# Patient Record
Sex: Female | Born: 1965 | Race: White | Hispanic: No | Marital: Married | Smoking: Never smoker
Health system: Southern US, Community
[De-identification: ages and names within clinical notes are randomized; demographics above are authoritative.]

## PROBLEM LIST (undated history)

## (undated) ENCOUNTER — Emergency Department (HOSPITAL_BASED_OUTPATIENT_CLINIC_OR_DEPARTMENT_OTHER)

## (undated) DIAGNOSIS — T8859XA Other complications of anesthesia, initial encounter: Secondary | ICD-10-CM

## (undated) DIAGNOSIS — R112 Nausea with vomiting, unspecified: Secondary | ICD-10-CM

## (undated) DIAGNOSIS — E079 Disorder of thyroid, unspecified: Secondary | ICD-10-CM

## (undated) DIAGNOSIS — M1990 Unspecified osteoarthritis, unspecified site: Secondary | ICD-10-CM

## (undated) DIAGNOSIS — I10 Essential (primary) hypertension: Secondary | ICD-10-CM

## (undated) DIAGNOSIS — E039 Hypothyroidism, unspecified: Secondary | ICD-10-CM

## (undated) DIAGNOSIS — L719 Rosacea, unspecified: Secondary | ICD-10-CM

## (undated) DIAGNOSIS — M549 Dorsalgia, unspecified: Secondary | ICD-10-CM

## (undated) DIAGNOSIS — K439 Ventral hernia without obstruction or gangrene: Secondary | ICD-10-CM

## (undated) DIAGNOSIS — K56609 Unspecified intestinal obstruction, unspecified as to partial versus complete obstruction: Secondary | ICD-10-CM

## (undated) DIAGNOSIS — T4145XA Adverse effect of unspecified anesthetic, initial encounter: Secondary | ICD-10-CM

## (undated) DIAGNOSIS — Z9889 Other specified postprocedural states: Secondary | ICD-10-CM

## (undated) HISTORY — PX: COLON SURGERY: SHX602

## (undated) HISTORY — PX: ENDOMETRIAL ABLATION: SHX621

## (undated) HISTORY — PX: LIVER BIOPSY: SHX301

## (undated) HISTORY — PX: BACK SURGERY: SHX140

## (undated) HISTORY — PX: REVISION COLOSTOMY: SHX1039

## (undated) HISTORY — PX: OTHER SURGICAL HISTORY: SHX169

## (undated) HISTORY — PX: CHOLECYSTECTOMY: SHX55

## (undated) HISTORY — PX: COLOSTOMY: SHX63

---

## 2001-02-07 ENCOUNTER — Other Ambulatory Visit: Admission: RE | Admit: 2001-02-07 | Discharge: 2001-02-07 | Payer: Self-pay | Admitting: Obstetrics and Gynecology

## 2003-07-28 ENCOUNTER — Ambulatory Visit (HOSPITAL_COMMUNITY): Admission: RE | Admit: 2003-07-28 | Discharge: 2003-07-28 | Payer: Self-pay | Admitting: Internal Medicine

## 2003-08-03 ENCOUNTER — Ambulatory Visit (HOSPITAL_COMMUNITY): Admission: RE | Admit: 2003-08-03 | Discharge: 2003-08-04 | Payer: Self-pay | Admitting: Neurological Surgery

## 2004-06-03 ENCOUNTER — Ambulatory Visit (HOSPITAL_COMMUNITY): Admission: RE | Admit: 2004-06-03 | Discharge: 2004-06-03 | Payer: Self-pay | Admitting: Neurological Surgery

## 2004-06-04 ENCOUNTER — Ambulatory Visit (HOSPITAL_COMMUNITY): Admission: AD | Admit: 2004-06-04 | Discharge: 2004-06-05 | Payer: Self-pay | Admitting: Neurological Surgery

## 2004-07-11 ENCOUNTER — Ambulatory Visit (HOSPITAL_COMMUNITY): Admission: RE | Admit: 2004-07-11 | Discharge: 2004-07-11 | Payer: Self-pay | Admitting: Neurological Surgery

## 2006-03-21 ENCOUNTER — Ambulatory Visit (HOSPITAL_COMMUNITY): Admission: RE | Admit: 2006-03-21 | Discharge: 2006-03-21 | Payer: Self-pay | Admitting: Neurological Surgery

## 2006-04-16 ENCOUNTER — Inpatient Hospital Stay (HOSPITAL_COMMUNITY): Admission: RE | Admit: 2006-04-16 | Discharge: 2006-04-23 | Payer: Self-pay | Admitting: Neurological Surgery

## 2006-06-18 ENCOUNTER — Ambulatory Visit (HOSPITAL_COMMUNITY): Admission: RE | Admit: 2006-06-18 | Discharge: 2006-06-18 | Payer: Self-pay | Admitting: Neurological Surgery

## 2006-11-26 ENCOUNTER — Ambulatory Visit (HOSPITAL_COMMUNITY): Admission: RE | Admit: 2006-11-26 | Discharge: 2006-11-26 | Payer: Self-pay | Admitting: Internal Medicine

## 2007-11-27 ENCOUNTER — Ambulatory Visit (HOSPITAL_COMMUNITY): Admission: RE | Admit: 2007-11-27 | Discharge: 2007-11-27 | Payer: Self-pay | Admitting: Internal Medicine

## 2008-05-11 ENCOUNTER — Ambulatory Visit (HOSPITAL_COMMUNITY): Admission: RE | Admit: 2008-05-11 | Discharge: 2008-05-11 | Payer: Self-pay | Admitting: Obstetrics & Gynecology

## 2008-06-17 ENCOUNTER — Encounter: Payer: Self-pay | Admitting: Obstetrics & Gynecology

## 2008-06-17 ENCOUNTER — Ambulatory Visit (HOSPITAL_COMMUNITY): Admission: RE | Admit: 2008-06-17 | Discharge: 2008-06-17 | Payer: Self-pay | Admitting: Obstetrics & Gynecology

## 2008-11-26 ENCOUNTER — Inpatient Hospital Stay (HOSPITAL_COMMUNITY): Admission: AD | Admit: 2008-11-26 | Discharge: 2008-12-02 | Payer: Self-pay | Admitting: Internal Medicine

## 2008-11-30 ENCOUNTER — Ambulatory Visit: Payer: Self-pay | Admitting: Internal Medicine

## 2008-12-01 ENCOUNTER — Encounter (INDEPENDENT_AMBULATORY_CARE_PROVIDER_SITE_OTHER): Payer: Self-pay | Admitting: General Surgery

## 2008-12-02 ENCOUNTER — Ambulatory Visit: Payer: Self-pay | Admitting: Internal Medicine

## 2008-12-03 ENCOUNTER — Encounter: Payer: Self-pay | Admitting: Internal Medicine

## 2008-12-06 ENCOUNTER — Ambulatory Visit: Payer: Self-pay | Admitting: Internal Medicine

## 2009-01-11 ENCOUNTER — Encounter: Payer: Self-pay | Admitting: Internal Medicine

## 2009-01-25 ENCOUNTER — Encounter: Payer: Self-pay | Admitting: Internal Medicine

## 2009-02-11 ENCOUNTER — Encounter (INDEPENDENT_AMBULATORY_CARE_PROVIDER_SITE_OTHER): Payer: Self-pay | Admitting: *Deleted

## 2009-02-15 ENCOUNTER — Encounter: Payer: Self-pay | Admitting: Gastroenterology

## 2009-02-15 DIAGNOSIS — E119 Type 2 diabetes mellitus without complications: Secondary | ICD-10-CM

## 2009-02-15 LAB — CONVERTED CEMR LAB: Creatinine, Ser: 0.43 mg/dL (ref 0.40–1.20)

## 2009-02-16 ENCOUNTER — Encounter: Payer: Self-pay | Admitting: Internal Medicine

## 2009-02-17 ENCOUNTER — Ambulatory Visit (HOSPITAL_COMMUNITY): Admission: RE | Admit: 2009-02-17 | Discharge: 2009-02-17 | Payer: Self-pay | Admitting: Internal Medicine

## 2009-02-17 ENCOUNTER — Encounter: Payer: Self-pay | Admitting: Internal Medicine

## 2009-03-01 ENCOUNTER — Ambulatory Visit (HOSPITAL_COMMUNITY): Admission: RE | Admit: 2009-03-01 | Discharge: 2009-03-01 | Payer: Self-pay | Admitting: Internal Medicine

## 2009-07-13 ENCOUNTER — Other Ambulatory Visit: Admission: RE | Admit: 2009-07-13 | Discharge: 2009-07-13 | Payer: Self-pay | Admitting: Obstetrics & Gynecology

## 2009-08-23 ENCOUNTER — Ambulatory Visit (HOSPITAL_COMMUNITY): Admission: RE | Admit: 2009-08-23 | Discharge: 2009-08-23 | Payer: Self-pay | Admitting: Internal Medicine

## 2010-07-19 ENCOUNTER — Other Ambulatory Visit: Admission: RE | Admit: 2010-07-19 | Discharge: 2010-07-19 | Payer: Self-pay | Admitting: Obstetrics & Gynecology

## 2010-08-25 ENCOUNTER — Ambulatory Visit (HOSPITAL_COMMUNITY)
Admission: RE | Admit: 2010-08-25 | Discharge: 2010-08-25 | Payer: Self-pay | Source: Home / Self Care | Attending: Obstetrics & Gynecology | Admitting: Obstetrics & Gynecology

## 2010-09-11 ENCOUNTER — Encounter: Payer: Self-pay | Admitting: Internal Medicine

## 2010-11-15 ENCOUNTER — Other Ambulatory Visit (HOSPITAL_COMMUNITY): Payer: Self-pay | Admitting: Neurological Surgery

## 2010-11-15 DIAGNOSIS — M545 Low back pain: Secondary | ICD-10-CM

## 2010-11-17 ENCOUNTER — Ambulatory Visit (HOSPITAL_COMMUNITY)
Admission: RE | Admit: 2010-11-17 | Discharge: 2010-11-17 | Disposition: A | Discharge status: Home / Self Care | Payer: Medicare Other | Source: Ambulatory Visit | Attending: Neurological Surgery | Admitting: Neurological Surgery

## 2010-11-17 DIAGNOSIS — M545 Low back pain, unspecified: Secondary | ICD-10-CM | POA: Insufficient documentation

## 2010-11-17 DIAGNOSIS — M5126 Other intervertebral disc displacement, lumbar region: Secondary | ICD-10-CM | POA: Insufficient documentation

## 2010-11-17 DIAGNOSIS — M5124 Other intervertebral disc displacement, thoracic region: Secondary | ICD-10-CM | POA: Insufficient documentation

## 2010-11-30 LAB — GLUCOSE, CAPILLARY
Glucose-Capillary: 104 mg/dL — ABNORMAL HIGH (ref 70–99)
Glucose-Capillary: 115 mg/dL — ABNORMAL HIGH (ref 70–99)
Glucose-Capillary: 120 mg/dL — ABNORMAL HIGH (ref 70–99)
Glucose-Capillary: 124 mg/dL — ABNORMAL HIGH (ref 70–99)
Glucose-Capillary: 138 mg/dL — ABNORMAL HIGH (ref 70–99)
Glucose-Capillary: 158 mg/dL — ABNORMAL HIGH (ref 70–99)
Glucose-Capillary: 163 mg/dL — ABNORMAL HIGH (ref 70–99)
Glucose-Capillary: 181 mg/dL — ABNORMAL HIGH (ref 70–99)
Glucose-Capillary: 70 mg/dL (ref 70–99)
Glucose-Capillary: 73 mg/dL (ref 70–99)
Glucose-Capillary: 77 mg/dL (ref 70–99)
Glucose-Capillary: 81 mg/dL (ref 70–99)
Glucose-Capillary: 84 mg/dL (ref 70–99)
Glucose-Capillary: 93 mg/dL (ref 70–99)

## 2010-11-30 LAB — BASIC METABOLIC PANEL
BUN: 2 mg/dL — ABNORMAL LOW (ref 6–23)
BUN: 4 mg/dL — ABNORMAL LOW (ref 6–23)
CO2: 28 mEq/L (ref 19–32)
Creatinine, Ser: 0.37 mg/dL — ABNORMAL LOW (ref 0.4–1.2)
GFR calc non Af Amer: >60 mL/min (ref >60)
GFR calc non Af Amer: >60 mL/min (ref >60)
Glucose, Bld: 147 mg/dL — ABNORMAL HIGH (ref 70–99)
Glucose, Bld: 71 mg/dL (ref 70–99)
Potassium: 3.8 mEq/L (ref 3.5–5.1)

## 2010-11-30 LAB — HCG, QUANTITATIVE, PREGNANCY: hCG, Beta Chain, Quant, S: <2 m[IU]/mL (ref <5)

## 2010-11-30 LAB — DIFFERENTIAL
Basophils Absolute: 0 10*3/uL (ref 0.0–0.1)
Basophils Absolute: 0 10*3/uL (ref 0.0–0.1)
Basophils Relative: 0 % (ref 0–1)
Basophils Relative: 0 % (ref 0–1)
Eosinophils Absolute: 0.1 10*3/uL (ref 0.0–0.7)
Eosinophils Absolute: 0.2 10*3/uL (ref 0.0–0.7)
Eosinophils Relative: 3 % (ref 0–5)
Lymphocytes Relative: 27 % (ref 12–46)
Lymphs Abs: 1.6 10*3/uL (ref 0.7–4.0)
Monocytes Absolute: 0.6 10*3/uL (ref 0.1–1.0)
Neutrophils Relative %: 76 % (ref 43–77)

## 2010-11-30 LAB — CBC
HCT: 36.3 % (ref 36.0–46.0)
Hemoglobin: 12.5 g/dL (ref 12.0–15.0)
MCHC: 34.4 g/dL (ref 30.0–36.0)
MCHC: 34.5 g/dL (ref 30.0–36.0)
MCV: 87.5 fL (ref 78.0–100.0)
Platelets: 199 10*3/uL (ref 150–400)
Platelets: 256 10*3/uL (ref 150–400)
RDW: 14.1 % (ref 11.5–15.5)
RDW: 14.3 % (ref 11.5–15.5)
WBC: 9.5 10*3/uL (ref 4.0–10.5)

## 2010-11-30 LAB — HEPATIC FUNCTION PANEL
Albumin: 3.5 g/dL (ref 3.5–5.2)
Indirect Bilirubin: 0.4 mg/dL (ref 0.3–0.9)
Total Protein: 6.8 g/dL (ref 6.0–8.3)

## 2010-11-30 LAB — H. PYLORI ANTIBODY, IGG: H Pylori IgG: <0.4 {ISR}

## 2011-01-03 NOTE — Consult Note (Signed)
Nancy [name], Nancy [name]                ACCOUNT NO.:  [id number]   MEDICAL RECORD NO.:  [id number]          PATIENT TYPE:  INP   LOCATION:  A313                          FACILITY:  [facility]   PHYSICIAN:  R. Roetta Sessions, M.D. DATE OF BIRTH:  [birth year]   DATE OF CONSULTATION:  11/29/2008  DATE OF DISCHARGE:                                 CONSULTATION   REASON FOR CONSULTATION:  Postprandial upper abdominal pain with  negative ultrasound and HIDA.   HISTORY OF PRESENT ILLNESS:  Nancy [name] is a very pleasant obese  45 year old lady from [place], [place], admitted to the hospital  November 26, 2008, with a 1 month history of insistent progressive  postprandial band like upper abdominal pain which she feels starts in  the area of her retro xiphoid region.  It almost always comes on after  eating.  She had a couple of severe episodes recently after eating a  fairly heavy fatty meal.  Within 30 minutes to an hour of taking the  meal she starts having these symptoms and they build for a couple of  hours and then they settle down over several more hours.  She has had  some associated nausea.  Denies any typical reflux symptoms.  No  odynophagia, no dysphagia.  Has not had any melena or hematochezia,  constipation or diarrhea.  She denies any recent weight loss, fever or  chills.  She has been admitted to Dr. Alonza Smoker service.  She has been  seen with Dr. Lovell Sheehan.  An ultrasound of the right upper quadrant  demonstrated a normal appearing gallbladder and biliary tree.  HIDA scan  demonstrated gallbladder EF of 63% without any reproduction in symptoms.  CT of the abdomen demonstrated a large left ovarian cyst and a small  nonspecific lesion in the right lobe of the liver for which an MRI was  recommended.  Her LFTs have been completely normal.  Amylase, lipase  also normal as well as CBC.  She denies any similar illness prior to  this past month.  No family history of GI malignancy or  chronic  gastrointestinal illness.   PAST MEDICAL HISTORY:  Significant for colonic perforation related to a  colonoscopy with polypectomy (Dr. Samuella Cota in [place]) back in 1995 for  which she underwent a laparotomy with diverting colonoscopy and  subsequent takedown over in [place], [place], and subsequent to  that Dr. Lovell Sheehan here repaired a peristomal hernia which had developed.  Past medical history of diabetes, hypertension, microalbuminuria,  hypothyroidism, obesity and endometrial ablation, nonalcoholic fatty  liver disease, polycystic ovarian syndrome, lumbar disk disease,  peripheral neuropathy, rosacea, history of kidney stones.  She has had  multiple spine surgeries relating to diskogenic disease.   ADMISSION MEDICATIONS:  1. Lantus 140 mg b.i.d.  2. NovoLog 15 units before each meal.  3. Metformin 1 g b.i.d.  4. Levoxyl 175 mcg daily.  5. Vasotec 5 mg daily.  6. Metrogel daily.  7. Amitriptyline 25 mg daily.  8. Robaxin p.r.n.  9. HCTZ 25 mg p.o.   ALLERGIES:  No known drug allergies.  FAMILY HISTORY:  Significant for hypertension, osteoporosis,  fibromyalgia, diabetes.  Again, no history of chronic GI or liver  illness.   SOCIAL HISTORY:  The patient lives in [place] with her husband.  No  tobacco, alcohol or illicit drugs.   REVIEW OF SYSTEMS:  Pretty much as in history of present illness.  Has  not had any yellow jaundice, dark tarry stools or dark colored urine.   PHYSICAL EXAMINATION:  GENERAL:  Reveals a pleasant 45 year old lady  resting comfortably in her hospital bed attended by her husband.  VITAL SIGNS:  Temperature 97.2, pulse 55, respiratory rate 20, BP  126/73.  SKIN:  Warm and dry.  HEENT:  No scleral icterus.  Conjunctivae are pink.  CHEST/LUNGS:  Clear to auscultation.  CARDIOVASCULAR:  Regular rate and rhythm without murmur, gallop or rub.  BREASTS:  Exam was deferred.  ABDOMEN:  Nondistended.  Obese.  She has a midline laparotomy scar  well-  healed and a very subtle colostomy scar left abdomen.  Positive bowel  sounds.  No bruits.  She does have retro xiphoid upper epigastric  discomfort to palpation.  Some discomfort also to the right upper  quadrant.  No appreciable mass or organomegaly.  EXTREMITIES:  No clubbing, cyanosis or edema.   ADMISSION LABORATORY DATA:  Beta hCG negative.  Hepatic function studies  completely normal on April 8.  CBC on April 8 showing white count 11.5,  H and H 13.5 and 39.2, MCV 87.5, platelet count 256,000, amylase 65.   IMPRESSION:  Nancy [name] is a pleasant 44 year old lady admitted  to the hospital with basically a 1 month history of postprandial upper  abdominal pain which sounds highly suspicious for gallbladder etiology  to me.  However, sonographically her gallbladder appears normal.  HIDA  study demonstrates no evidence of acute cholecystitis or biliary  dyskinesia.  CT also unrevealing as to the etiology of her symptoms.  She does have a nonspecific right hepatic lobe liver lesion which needs  followup.  However, as suggested by radiology she will not be able to  have an MRI because of the hardware in her back related to prior spine  surgery.   She has a history of a colonic perforation related to polypectomy  previously.  I do not have any records as to what type of polyp was  taken off up in [place] back in 1995.  This information should be  pursued as she may be overdue for surveillance colonoscopy.   Again, as far as her recent illness is concerned there is really nothing  in her history to suggest uncontrolled gastroesophageal reflux disease  or other acid peptic disease.  She really does not have any alarm  symptoms suggestive of more ominous underlying occult pathology such as  neoplasia.  I feel the lesion in the right lobe of her liver has nothing  to do with her present symptoms.   RECOMMENDATIONS:  1. I agree with Dr. Ouida Sills.  She ought to go ahead and  have an EGD to      rule out luminal pathology in her upper GI tract contributing to      her symptoms.  If this study is negative I feel Nancy [name] ought to      go ahead and have a laparoscopic cholecystectomy regardless of      negative findings on HIDA and ultrasound.  2. I do recommend she have a 3 month followup hepatic CT scan with IV  contrast.  3. We will look at old records going back to 1995 to see if we can      shed any light on pathology on prior polyp removed so that we can      make recommendations regarding her next colonoscopy.   I would like to thank Dr. Carylon Perches for allowing me to see this nice  lady once again in consultation.      Jonathon Bellows, M.D.  Electronically Signed     RMR/MEDQ  D:  11/29/2008  T:  11/29/2008  Job:  [id number]   cc:   Dalia Heading, M.D.  Fax: [phone]   Kingsley Callander. Ouida Sills, MD  Fax: [phone]

## 2011-01-03 NOTE — Op Note (Signed)
Nancy [name], Nancy [name]                ACCOUNT NO.:  [id number]   MEDICAL RECORD NO.:  [id number]          PATIENT TYPE:  AMB   LOCATION:  DAY                           FACILITY:  [facility]   PHYSICIAN:  Lazaro Arms, M.D.   DATE OF BIRTH:  August 30, 1965   DATE OF PROCEDURE:  06/17/2008  DATE OF DISCHARGE:                               OPERATIVE REPORT   PREOPERATIVE DIAGNOSES:  1. Menometrorrhagia.  2. Endometrial polyp versus submucosal myoma.   POSTOPERATIVE DIAGNOSES:  1. Menometrorrhagia.  2. Endometrial polyp versus submucosal myoma.  3. Submucosal myoma.   PROCEDURE:  1. Hysteroscopic excision of submucosal myoma.  2. Endometrial ablation.  3. Endometrial laser as well.   SURGEON:  Lazaro Arms, MD   ANESTHESIA:  General endotracheal.   FINDINGS:  The patient had a stubborn 2- x 2-cm submucosal myoma.  The  rest of the endometrial cavity looked normal.   DESCRIPTION OF OPERATION:  The patient was taken to the operating room  and placed in supine position, then underwent general endotracheal  anesthesia, placed in dorsal lithotomy position, and prepped and draped  in the usual sterile fashion.  A speculum was placed.  Cervix dilated  serially to allow passage of the hysteroscope.  Hysteroscopy performed.  I spent about 30 minutes trying to excise a very stubborn 2- x 2-cm  submucosal myoma.  I did it bluntly.  I tried curetting.  I tried a  laser.  Laser worked pretty well, but just created too many bubbles like  the ring forceps, which I had been trying before, but was unsuccessful  and twisted off.  There was no bleeding on the stalk.  I then did an  endometrial ablation using the ThermaChoice 3 endometrial ablation  balloon.  A 45 mL of D5W was required to maintain a pressure of 192  mmHg.  Total therapy time was 15 minutes and 4 seconds, I believe.  First catheter did  have an error.  We had to change catheters, but I had not started the  procedure yet.  The patient  tolerated the procedure well.  She  experienced minimal blood loss and was taken to the recovery room in  good and stable condition.  All counts were correct.      Lazaro Arms, M.D.  Electronically Signed     LHE/MEDQ  D:  06/17/2008  T:  06/17/2008  Job:  [id number]

## 2011-01-03 NOTE — Op Note (Signed)
Nancy [name], Nancy [name]                ACCOUNT NO.:  [id number]   MEDICAL RECORD NO.:  [id number]          PATIENT TYPE:  INP   LOCATION:  A313                          FACILITY:  [facility]   PHYSICIAN:  Dalia Heading, M.D.  DATE OF BIRTH:  1966/05/26   DATE OF PROCEDURE:  12/01/2008  DATE OF DISCHARGE:                               OPERATIVE REPORT   PREOPERATIVE DIAGNOSIS:  Chronic cholecystitis.   POSTOPERATIVE DIAGNOSIS:  Chronic cholecystitis.   PROCEDURE:  Laparoscopic cholecystectomy.   SURGEON:  Dalia Heading, MD   ANESTHESIA:  General endotracheal.   INDICATIONS:  The patient is a 45 year old white female who presents  with a biliary colic.  She has had an extensive workup including  ultrasound of the gallbladder, HIDA scan, an EGD, all of which had been  negative.  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  The risks and  benefits of the procedure including bleeding, infection, recurrence of  her symptoms, hepatobiliary injury, and the possibility of an open  procedure were fully explained to the patient, gave informed consent.   PROCEDURE NOTE:  The patient was placed in supine position.  After  induction of general endotracheal anesthesia, the abdomen was prepped  and draped using the usual sterile technique with Betadine.  Surgical  site confirmation was performed.   A supraumbilical incision was made down to the fascia.  A Veress needle  was introduced into the abdominal cavity and confirmation of placement  was done using the saline drop test.  The abdomen was then insufflated  to 16 mmHg pressure.  An 11-mm trocar was introduced into the abdominal  cavity under direct visualization without difficulty.  The patient was  placed in reverse Trendelenburg position.  An additional 11-mm trocar  was placed in the epigastric region.  5-mm trocars were placed in the  right upper quadrant, right flank  regions.  Liver was inspected and  noted to be fatty in nature.  The gallbladder was retracted superior and  laterally.  The dissection was begun around the infundibulum of the  gallbladder.  The cystic duct was first identified.  The juncture to the  infundibulum was fully identified.  Endoclips were placed proximally and  distally on the cystic duct and the cystic duct was divided.  This was  likewise done on the cystic artery.  The gallbladder was then freed away  from the gallbladder fossa using Bovie electrocautery.  Gallbladder was  delivered through the epigastric trocar site using EndoCatch bag.  The  gallbladder fossa was inspected.  No abnormal bleeding or bile leakage  was noted.  Surgicel was placed in the gallbladder fossa.  All fluid and  air were then evacuate from the abdominal cavity prior to removal of the  trocars.   All wounds were irrigated with normal saline.  All wounds were injected  with 0.5% Sensorcaine.  The supraumbilical fascia was reapproximated  using an 0 Vicryl interrupted suture.  All skin incisions were closed  using staples.  Betadine ointment and dry sterile dressings  were  applied.   All tape and needle counts were correct at the end of the procedure.  The patient was extubated in the operating room, went back to recovery  room awake in stable condition.   COMPLICATIONS:  None.   SPECIMEN:  Gallbladder.   BLOOD LOSS:  Minimal.      Dalia Heading, M.D.  Electronically Signed     MAJ/MEDQ  D:  12/01/2008  T:  12/02/2008  Job:  [id number]   cc:   Kingsley Callander. Ouida Sills, MD  Fax: [phone]   R. Roetta Sessions, M.D.  [address]  [place]  [place] [address]

## 2011-01-03 NOTE — Discharge Summary (Signed)
NAMEMABREY, HOWLAND                ACCOUNT NO.:  [id number]   MEDICAL RECORD NO.:  [id number]          PATIENT TYPE:  INP   LOCATION:  A313                          FACILITY:  [facility]   PHYSICIAN:  Dalia Heading, M.D.  DATE OF BIRTH:  09/11/1965   DATE OF ADMISSION:  11/26/2008  DATE OF DISCHARGE:  04/14/2010LH                               DISCHARGE SUMMARY   HOSPITAL COURSE SUMMARY:  The patient is a 45 year old white female with  multiple medical problems who presented to the hospital with worsening  upper abdominal pain, nausea, vomiting.  She was admitted by Dr. Carylon Perches for further evaluation and treatment.  A surgery consultation was  obtained.  She was noted to have normal liver enzyme tests.  She had an  ultrasound of the gallbladder which was negative.  A HIDA scan was also  performed which was negative.  Dr. Jena Gauss of gastroenterology was  consulted.  An EGD for the most part was unremarkable.  The patient  continued to have biliary colic symptoms.  It was felt after discussion  that she needed her gallbladder out due to sphincter of Oddi  dysfunction.  She subsequently underwent a laparoscopic cholecystectomy  on December 01, 2008.  She tolerated the procedure well.  Her postoperative  course has been unremarkable.  Diet was advanced without difficulty.  Her preoperative symptoms have resolved.   The patient is being discharged home on December 02, 2008, in good  improving condition.   DISCHARGE INSTRUCTIONS:  The patient is to follow up Dr. Franky Macho on  December 10, 2008.   Discharge medications include:  1. Vicodin 1-2 tablets p.o. q.4 hours p.r.n. pain.  2. Baby aspirin 1 tablet p.o. daily.  3. Levothyroxine 175 mcg p.o. daily.  4. Metformin 1000 mg p.o. b.i.d.  5. Enalapril 5 mg p.o. nightly.  6. Lantus 140 units subcu b.i.d.  7. NovoLog regular insulin 15 units subcu before each meal.  8. Methocarbamol 500 mg p.o. daily.  9. Doxycycline 100 mg p.o. b.i.d.  10.Metronidazole cream twice a day to the face.   PRINCIPAL DIAGNOSES:  1. Biliary dyskinesia.  2. Hypothyroidism.  3. Insulin-dependent diabetes mellitus.  4. Obesity.   PRINCIPAL PROCEDURES:  1. EGD on November 30, 2008.  2. Laparoscopic cholecystectomy on December 01, 2008.      Dalia Heading, M.D.  Electronically Signed     MAJ/MEDQ  D:  12/02/2008  T:  12/03/2008  Job:  [id number]   cc:   R. Roetta Sessions, M.D.  [address]  [place]  [place] [address]   Kingsley Callander. Ouida Sills, MD  Fax: [phone]

## 2011-01-03 NOTE — Op Note (Signed)
NAMESVETLANA, BAGBY                ACCOUNT NO.:  [id number]   MEDICAL RECORD NO.:  [id number]          PATIENT TYPE:  INP   LOCATION:  A313                          FACILITY:  [facility]   PHYSICIAN:  R. Roetta Sessions, M.D. DATE OF BIRTH:  07/28/1966   DATE OF PROCEDURE:  11/30/2008  DATE OF DISCHARGE:  12/02/2008                               OPERATIVE REPORT   PROCEDURE:  Diagnostic esophagogastroduodenoscopy.   INDICATIONS FOR PROCEDURE:  A 45 year old lady with a history of  postprandial upper abdominal pain.  Ultrasound of the gallbladder  negative and HIDA demonstrates no evidence of cholecystitis or biliary  dyskinesia.  EGD now being done.  The risks, benefits, alternatives and  limitations have been discussed, questions answered.  Please see the  documentation in the medical record.   PROCEDURE NOTE:  O2 saturation, blood pressure, pulse and respiration  were monitored throughout the entirety of the procedure.  Conscious  sedation with Versed 4 mg IV, Demerol 100 mg IV in divided doses.   INSTRUMENT:  Pentax video chip system.   FINDINGS:  Examination of the tubular esophagus revealed normal mucosa.  EG junction easily traversed.  Stomach:  The gastric cavity was emptied  and insufflated well with air.  Thorough examination of the gastric  mucosa, including retroflexion of the proximal stomach and  esophagogastric junction demonstrated only a small hiatal hernia and  tiny antral erosions.  The pylorus was patent and easily traversed.  Examination of the bulb and second portion revealed no abnormalities.  Therapeutic/Diagnostic maneuvers performed:  None.   The patient tolerated the procedure well, was reacted in endoscopy.   IMPRESSION:  Normal esophagus, small hiatal hernia, tiny antral  erosions, otherwise normal stomach D1-D2.   Today's EGD demonstrated no significant findings.  Clinically, I suspect  gallbladder disease as the underlying cause of her symptoms despite  of  negative objective studies.   RECOMMENDATIONS:  Proceed with cholecystectomy as gallbladder most  likely the cause of this lady's symptoms.      Jonathon Bellows, M.D.  Electronically Signed     RMR/MEDQ  D:  01/14/2009  T:  01/14/2009  Job:  [id number]   cc:   Kingsley Callander. Ouida Sills, MD  Fax: [phone]   Lovell Sheehan, MD

## 2011-01-03 NOTE — H&P (Signed)
Nancy [name], Nancy [name]                ACCOUNT NO.:  [id number]   MEDICAL RECORD NO.:  [id number]          PATIENT TYPE:  INP   LOCATION:  A313                          FACILITY:  [facility]   PHYSICIAN:  Kingsley Callander. Ouida Sills, MD       DATE OF BIRTH:  11-Jul-1966   DATE OF ADMISSION:  11/26/2008  DATE OF DISCHARGE:  LH                              HISTORY & PHYSICAL   CHIEF COMPLAINT:  Abdominal pain.   HISTORY OF PRESENT ILLNESS:  This patient is a 45 year old white female  with metabolic syndrome who presented to the office complaining of  recurrent bouts of upper abdominal pain for the past 5 days.  Pain has  been worse with eating.  She had a much greater intensity of pain after  eating salisbury steak with gravy on Tuesday night.  She has felt  nausea, but has not vomited.  Bowel habits have been normal.  She has  not experienced melena or rectal bleeding.  She was initially evaluated  in the office and found to be tender in the epigastrium and right upper  quadrant.  She has a history of a fatty liver, but no known gallstones.  She has previously had a sigmoid colon resection for perforation.  She  had a colostomy followed later by re-anastomosis.  She has had abdominal  hernia repair.  She denies fevers or chills.   PAST MEDICAL HISTORY:  1. Diabetes.  2. Hypertension.  3. Microalbuminuria.  4. Hypothyroidism.  5. Obesity.  6. Endometrial ablation.  7. Nonalcoholic fatty liver disease.  8. Polycystic ovarian syndrome.  9. Lumbar disk disease.  10.Peripheral neuropathy.  11.Rosacea.  12.Kidney stones, status post lithotripsy.   MEDICATIONS:  1. Lantus 140 units b.i.d.  2. NovoLog 15 units before each meal.  3. Metformin 1000 mg b.i.d.  4. Levoxyl 175 mcg daily.  5. Vasotec 5 mg daily.  6. MetroGel.  7. Amitriptyline 25 mg p.r.n.  8. Robaxin p.r.n.  9. HCTZ 25 mg p.r.n.   ALLERGIES:  None.   SOCIAL HISTORY:  She does not smoke, drink, or use drugs.   FAMILY HISTORY:  Her  father has had hypertension and osteoporosis and  fibromyalgia.  Grandmother and grandfather had diabetes.   REVIEW OF SYSTEMS:  Noncontributory.   PHYSICAL EXAMINATION:  VITAL SIGNS:  Weight 263, blood pressure 140/86,  temperature 98, and pulse 82.  GENERAL:  Alert, in no acute distress.  HEENT:  No scleral icterus.  Pharynx is unremarkable.  She has rosacea  changes of the cheeks.  NECK:  No JVD or thyromegaly.  LUNGS:  Clear.  HEART:  Regular with no murmurs.  ABDOMEN:  Tender in the epigastrium and right upper quadrant.  No  palpable organomegaly.  Normal bowel sounds.  EXTREMITIES:  No cyanosis, clubbing, or edema.  NEURO:  No focal weakness.  LYMPH NODES:  No enlargement.  SKIN:  Warm and dry.   LABORATORY DATA:  Her CBC, hepatic profile, and amylase were normal.  Her MET-7 is pending.   IMPRESSION:  1. Probable cholecystitis and abdominal ultrasound will be obtained  in      the a.m.  She will have a surgical consultation with Dr. Lovell Sheehan.      Her case has been discussed with him.  2. Diabetes.  Continue Lantus at a lower dose and continue NovoLog.      Hold metformin for now.  3. Hypertension and microalbuminuria.  Continue Vasotec.  4. Hypothyroidism.  Continue Levoxyl.  5. History of nonalcoholic fatty liver disease.  6. Polycystic ovarian syndrome.  7. Lumbar spondylosis.  8. Peripheral neuropathy.      Kingsley Callander. Ouida Sills, MD  Electronically Signed     ROF/MEDQ  D:  11/26/2008  T:  11/27/2008  Job:  [id number]

## 2011-01-06 NOTE — Op Note (Signed)
NAMEJAKELINE, DAVE                          ACCOUNT NO.:  [id number]   MEDICAL RECORD NO.:  [id number]                   PATIENT TYPE:  OIB   LOCATION:  3037                                 FACILITY:  [facility]   PHYSICIAN:  Stefani Dama, M.D.               DATE OF BIRTH:  [birth year]   DATE OF PROCEDURE:  08/03/2003  DATE OF DISCHARGE:                                 OPERATIVE REPORT   PREOPERATIVE DIAGNOSIS:  Herniated nucleus pulposus, L4-5 right, with right  lumbar radiculopathy.   POSTOPERATIVE DIAGNOSIS:  Herniated nucleus pulposus, L4-5 right, with right  lumbar radiculopathy.   PROCEDURE:  Right lumbar laminotomy and diskectomy, L4-5 right, with METRx  instrumentation, microscope microdissection technique.   SURGEON:  Stefani Dama, M.D.   FIRST ASSISTANT:  Hilda Lias, M.D.   ANESTHESIA:  General endotracheal.   INDICATIONS:  Ms. Mink is a 45 year old right-handed individual who has  had a significant history of back and right leg pain.  She has a large  extruded fragment of disk at L4-5.  She has been advised regarding surgery.   DESCRIPTION OF PROCEDURE:  The patient was brought to the operating room  supine on a stretcher.  After smooth induction of general endotracheal  anesthesia, she was turned prone and the back was shaved, prepped with  Hibiclens and alcohol, and draped in a sterile fashion.  The L4-5 space was  localized on the right side using AP fluoroscopy and then lateral  fluoroscopy was used to identify the level of the disk space and trajectory.  A K-wire was applied to the laminar arch of L4 and then a wanding technique  was used to pass a series of dilators over the K-wire after a small incision  was made in its patch.  An 18 mm diameter by 6 cm deep endoscopic cannula  was fixed to the operating table rigidly.  The microscope was draped and  brought into the field and then through this aperture, the soft tissues were  cleared from the  laminar bone and then a laminotomy was created removing the  inferior margin of the lamina of L5 out to the mesial wall of the facet.  Yellow ligament was taken up in this area and the common dural tube was  identified.  The takeoff of the L5 nerve root was similarly identified.  This was noted to be splayed dorsally.  By uncovering the root out toward  the foramen, the epidural veins in this region were then decompressed by  microdissection technique and then bipolar cautery and dividing them with  micro-scissors.  This allowed for retraction of the common dural tube  medially, and immediately there was evident free fragment of disk material.  One portion was removed and then a second, much larger portion was removed  and several other fragments were removed, and this allowed for good  decompression of the common dural tube  and also the epidural veins, which  bled profusely.  Bipolar cautery was used to establish hemostasis.  Care was  then taken to enter the disk space and evacuate a significant further  quantity of remarkably degenerated disk material.  Once this diskectomy was  completed, care was taken to make sure that the L5 nerve root and the common  dural tube were maintained intact.  There were no residual fragments of disk  around the area.  The area was then copiously irrigated with antibiotic  irrigating solution and after it was found to be dry and hemostatic, 0.5 mL  of fentanyl, that is, 25 mcg, was left in the epidural space.  The operating  microscope was removed.  The endoscopic cannula was removed, and then the  subcutaneous tissue was closed with 3-0 Vicryl in interrupted fashion and  the subcuticular tissue was closed similarly.  Dermabond was placed on the  skin.  The patient tolerated the procedure well and was returned to the  recovery room in stable condition.                                               Stefani Dama, M.D.    Merla Riches  D:  08/03/2003  T:   08/04/2003  Job:  [id number]

## 2011-01-06 NOTE — Op Note (Signed)
Nancy [name], Nancy [name]                ACCOUNT NO.:  [id number]   MEDICAL RECORD NO.:  [id number]          PATIENT TYPE:  INP   LOCATION:  3002                         FACILITY:  [facility]   PHYSICIAN:  Stefani Dama, M.D.  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   PROCEDURE:  Bilateral diskectomy, L4-L5; posterior lumbar interbody  arthrodesis with PEEK spacers, local autograft and allograft; fixation L4-L5  with pedicle screws; posterolateral arthrodesis with local autograft and  allograft.   SURGEON:  Dr. Barnett Abu.   FIRST ASSISTANT:  Dr. Delma Officer.   ANESTHESIA:  General endotracheal.   INDICATIONS:  Nancy [name] is a 45 year old individual who has had  significant back and right lower extremity pain and weakness having had some  calf atrophy and tibialis anterior weakness.  She has had a herniated  nucleus pulposus at the L4-L5 level and underwent surgical resection of this  on two occasions.  She has had significant problems with chronic back pain  and persistent radicular pain and after careful consideration of her  options, was advised that she undergo surgical resection of the disk for a  third time with arthrodesis of the joint at L4-L5.  The patient also has  comorbidity factors of diabetes, hypertension and hypothyroidism.   PROCEDURE:  The patient was brought to the operating room supine on the  stretcher.  After smooth induction of general endotracheal anesthesia, she  was turned prone.  The back was prepped with DuraPrep and draped in sterile  fashion.  A midline incision was created and carried down to lumbodorsal  fascia.  The fascia was opened on either side of midline to expose the  interlaminar  space at L4-L5 which was identified positively with a  radiograph.  Then by removing the fascial scar from the old laminotomy site  on the right side at L4-L5, interlaminar space could be identified.  The  dura was identified.  The scar was released very carefully.  On the lateral  aspect there was noted be a previous repair of the dura with some Prolene  sutures.  Despite most careful attempts at dissecting this scar,spinal fluid  leak ensued and this required tamponade of the leak so that the dissection  and decompression of the nerve root could be completed.  Once this area was  decompressed, it was noted that the leak was largely lateral and on the  underside of the dura.  The previous repair was noted with several Prolene  sutures in this area.  The common dural tube and the root was carefully  retracted medially and the disk space was then opened and was noted be  contiguous with the nerve root in this area and careful decompression of  this area was performed so as to allow flat passage of the nerve root as it  exited the foramen  of L5 on the right side.  This portion of decompression  was done with use of microscopic visualization and microdissection  technique.  Then with the dura and the nerve root carefully protected, the  disk space was evacuated of significant quantity of severely degenerated  disk material and ultimately a series of osteotomes and disk shapers were  used to open this disk space to the 8 mm size.  The distraction tool was  also used and this did not seem to allow for much distraction of the disk  space as there was significant spondylitic sclerosis of both the endplates  and the annular ligament.  Once this was prepared attention was turned to  the left side where a laminotomy was created removing the inferior margin  lamina of L4 out to the medial wall of facette and nearly complete  facetectomy was created and this bone was saved for later use of as   autograft to be mixed with 5 mL of Osteocel.  The yellow ligament was taken  up and the dissection was taken down to the dura.  The dura was carefully  protected and the nerve root was mobilized medially.  Epidural veins in this  area were cauterized and divided and disk space was identified.  There was  noted to be a significant posterior bony ridge.  This was opened with a #15  blade and a combination of curettes, rongeurs and small osteotomes were used  to evacuate a moderate quantity of severely degenerated disk material.  Disk  shapers were again used to open this space up to a 7 mm size and with this  still it was noted that the interspace was rather tight.  The material  removed from within the disk space was then carefully peeled away from the  annular ligament, particularly the deep areas of disk space was firmly  attached to the ligament itself.  Once the disk space was prepared, the  autograft that was mixed with Osteocel was then packed into the base of the  opening and 8 mm cage was placed on the right side followed by an 8 mm cage  on the left side.  Remainder of bone was packed into the disk space around  either side of the cages.  Once this portion was completed, attention was  then turned to the tear in the dural tube on the left side and by carefully  inspect it, it was identified that the dura was rather shredded and was not  felt that this would be amendable to closure with primary closure with  suture.  As the dura had been well decompressed at this point the area was  covered with a coat of Tisseel.  Attention was then turned to pedicle entry  sites and fluoroscopic guidance was used to identify pedicle entry sites at  L4 and L5.  Probes were passed to passed into each of pedicle entry sites  superiorly and inferiorly and when good placement was identified  radiographically, each of the probes was removed.  The hole was tapped with 6.5 mm diameter tap and 6.5 x 45 mm  screws were placed into the pedicles at  L4 and also at L5.  Final radiographs were obtained in both the AP and  lateral projections.  Then 35 mm rods that were pre contoured were placed in  between the pedicle screws and these were torqued down to the final  tightening torque.  The lateral gutters which had been previously prepared  were then packed with the remainder of the bone graft that had been  harvested along with the Osteocel material.  Once the grafting was completed  the wound was checked for hemostasis.  The lumbodorsal fascia was then  reapproximated and closed with #1 Vicryl interrupted fashion, 2-0 Vicryl was  used in the subcutaneous tissues, 3-0 Vicryl subcuticularly and Dermabond  was used in the skin.  The patient tolerated procedure well was returned to  recovery in stable condition.      Stefani Dama, M.D.  Electronically Signed     HJE/MEDQ  D:  04/16/2006  T:  04/17/2006  Job:  [id number]

## 2011-01-06 NOTE — Op Note (Signed)
[name], [name]                ACCOUNT NO.:  [id number]   MEDICAL RECORD NO.:  [id number]          PATIENT TYPE:  AMB   LOCATION:  SDS                          FACILITY:  [facility]   PHYSICIAN:  Stefani Dama, M.D.  DATE OF BIRTH:  Jul 10, 1966   DATE OF PROCEDURE:  06/18/2006  DATE OF DISCHARGE:  06/18/2006                                 OPERATIVE REPORT   PREOPERATIVE DIAGNOSIS:  Status post superficial wound infection lumbar  spine.   POSTOPERATIVE DIAGNOSIS:  Status post superficial wound infection lumbar  spine.   PROCEDURE:  Is a revision and closure of superficial lumbar wound.   INDICATIONS:  Nancy [name] is a 45 year old individual who has had lumbar  spine surgery in early September. She developed a lumbar wound infection  which was treated superficially. She developed a significant eschar in the  area of the wound and this has required some wound care for the past several  weeks. However, the eschar seems to be persistent and it has been advised  that she undergo revision of lumbar wound with debridement and closure at  this time.   PROCEDURE:  The patient was brought to the operating room, placed on the  table in the right lateral decubitus position. Back was then prepped with  Hibiclens and DuraPrep and draped sterilely. An elliptical incision was made  around the previous eschar and this tissue was excised. The dissection was  taken down through to the subcutaneous tissues.  Once the area was debrided  and the open wound measured, approximately 4 cm in length 3 cm in width, the  subcutaneous tissue was then brought together with several 2-0 Vicryl  sutures.  The skin was then closed with interrupted mattress sutures of  vertical nylon. Five stitches were used in total. With this the skin edges  were opposed.  A dry sterile dressing was placed over Adaptic.  The patient  was then returned to recovery room in stable condition.      Stefani Dama, M.D.  Electronically Signed     HJE/MEDQ  D:  06/18/2006  T:  06/19/2006  Job:  [id number]

## 2011-01-06 NOTE — Discharge Summary (Signed)
Nancy [name], Nancy [name]                ACCOUNT NO.:  [id number]   MEDICAL RECORD NO.:  [id number]          PATIENT TYPE:  INP   LOCATION:  3002                         FACILITY:  [facility]   PHYSICIAN:  Hewitt Shorts, M.D.DATE OF BIRTH:  Jan 16, 1966   DATE OF ADMISSION:  04/16/2006  DATE OF DISCHARGE:  04/23/2006                                 DISCHARGE SUMMARY   ADMISSION HISTORY AND PHYSICAL EXAMINATION:  The patient is a 45 year old  woman under the care of Dr. Danielle Dess.  She presented with lumbar disc  herniation, spondylosis and radiculopathy.  Further details for admission  history and physical examination included in Dr. Verlee Rossetti admission note.   HOSPITAL COURSE:  The patient underwent an L4-L5 discectomy and fusion.  She  has made steady progress through the postoperative period.  She was seen in  medicine consultation by Dr. Sharon Seller during the postoperative period.  She  had been able to gradually make progress.  She had difficulties with  postoperative constipation, which has since resolved.  Her wound is healing  well, and at this point, she is up and ambulating actively in the halls, and  at this point, she is asking to be discharged to home.  She was instructions  regarding wound care and activity.  She is to return in 2-3 weeks for  followup with Dr. Danielle Dess.  Discharge prescription was given for Percocet for  pain.   DISCHARGE DIAGNOSES:  1. Lumbar disc herniation.  2. Lumbar spondylosis.  3. Lumbar radiculopathy.      Hewitt Shorts, M.D.  Electronically Signed     RWN/MEDQ  D:  04/23/2006  T:  04/23/2006  Job:  [id number]

## 2011-01-06 NOTE — H&P (Signed)
[name], [name]                ACCOUNT NO.:  [id number]   MEDICAL RECORD NO.:  [id number]          PATIENT TYPE:  INP   LOCATION:  3172                         FACILITY:  [facility]   PHYSICIAN:  Stefani Dama, M.D.  DATE OF BIRTH:  10/14/1965   DATE OF ADMISSION:  04/16/2006  DATE OF DISCHARGE:                                HISTORY & PHYSICAL   ADMISSION DIAGNOSES:  1. Spondylosis with recurrent herniated nucleus pulposus L4-L5.  2. Right lumbar radiculopathy.   POSTOPERATIVE DIAGNOSIS:  1. Spondylosis with recurrent herniated nucleus pulposus L4-L5.  2. Right lumbar radiculopathy.  3. Comorbid diagnoses: Diabetes mellitus, hypertension, hypothyroidism.   HISTORY OF PRESENT ILLNESS:  [name] is a 45 year old individual  who was seen back in December 2004. At that time, she had severe back and  right lower extremity and had a large extruded fragment of disk at the L4-L5  level compressing the L5 nerve root.  She underwent surgical decompression  and for a period of time seemed to be doing better.  The patient  subsequently developed significant difficulties with recurrent pain, was  found to have small recurrence of the disk at the L4-L5 level, and then  underwent repeat resection of this process via microdiskectomy. After this  time, the patient was noted to have developed significant atrophy in the  region of the right gluteus. She had weakness also in the tibialis anterior  group and in general had problems with chronic back pain in addition to the  right lumbar radiculopathy.  She has been followed conservatively for this  process, but at this point, she has been advised regarding need for surgical  decompression and stabilization as she finds the pain is becoming  increasingly intractable.  She is now admitted for this procedure.   PAST MEDICAL HISTORY:  Reveals the patient does have a history of diabetes  mellitus, and she has some thyroid abnormalities.   She  has been on longstanding thyroid replacement and currently is managing  her diabetes with Glucophage 1000 mg twice a day, glipizide 1200 mg a day,  glipizide 10 mg as a suspension, Cozaar 50 mg daily, Synthroid 175 mcg a day  and amitriptyline 25 mg at bedtime.  She also uses a baby aspirin daily.   FAMILY HISTORY:  Reveals that her father is age 51, has osteoporosis, and  there is some diabetes and high blood pressure on both sides of the family.   SOCIAL HISTORY:  Reveals the patient does not smoke.  She does not drink  alcohol.  Her height and weight have been fairly stable at 5 feet 7 inches,  225 pounds.   SYSTEMS REVIEW:  Is notable for back pain, diabetes, thyroid disease, all  noted on the 14-point review sheet.   PHYSICAL EXAMINATION:  GENERAL: She is an alert, oriented individual in  moderate distress with back. She stands straight and erect but tends to  favor a 5-degree forward stoop.  She will walk with a modest antalgia  involving that right lower extremity, and there is evidence of weakness in  the gastroc  at 4/5, also weakness in tibialis anterior at 4/5.  There is  atrophy of the gastroc with approximately 2.5 cm atrophy noted on the right  side compared to the left side.  Her deep tendon reflexes are 2+ in both  patellae, absent in the left Achilles, 2+ the right Achilles.  Babinski  downgoing.  Sensation appears intact to pin and vibration in distal lower  extremities.  Upper extremity strength and reflexes are completely within  limits of normal as is the cranial nerve examination.  NECK: Examination of neck reveals no masses, no bruits are heard.  LUNGS: Clear to auscultation.  HEART: Has regular rate and rhythm.  ABDOMEN: Soft protuberant.  Bowel sounds positive.  No masses are palpable.  BACK: Palpation of the back reveals that there is tenderness at the  lumbosacral junction bilaterally.  There is a moderate amount of  paravertebral spasm that can easily be  elicited by palpation or percussion.   IMPRESSION:  The patient has evidence of spondylitic disease in the lower  lumbar spine.  She has failed efforts at conservative management with  passage of time and is now being taken to the operating room to undergo  decompression at L4-5 for the third time along with arthrodesis.      Stefani Dama, M.D.  Electronically Signed     HJE/MEDQ  D:  04/16/2006  T:  04/16/2006  Job:  [id number]

## 2011-01-06 NOTE — Op Note (Signed)
NAMECAOIMHE, DAMRON                ACCOUNT NO.:  [id number]   MEDICAL RECORD NO.:  [id number]          PATIENT TYPE:  OIB   LOCATION:  2899                         FACILITY:  [facility]   PHYSICIAN:  Stefani Dama, M.D.  DATE OF BIRTH:  03/29/1966   DATE OF PROCEDURE:  06/04/2004  DATE OF DISCHARGE:                                 OPERATIVE REPORT   PREOPERATIVE DIAGNOSIS:  Herniated nucleus pulposus, L4-5 right with right  lumbar radiculopathy (recurrent).   POSTOPERATIVE DIAGNOSIS:  Herniated nucleus pulposus, L4-5 right with right  lumbar radiculopathy (recurrent).   PROCEDURES:  Right L4-5 microdiskectomy with operating microscope  microdissection technique.   SURGEON:  Stefani Dama, M.D.   ANESTHESIA:  General endotracheal.   INDICATIONS:  Basilia Stuckert is a 45 year old individual who has had  significant back and right lower extremity pain for nearly two months' time.  She had a diskectomy in December of last year and tolerated this well.  She  was able to return to the work place.  She had been doing well until August  and then developed the rather severe onset of right leg pain.  She has  developed atrophy in the right gastrosoleus complex and weakness in the  tibialis anterior of her right leg.  MRI demonstrates that she has a large  extruded fragment of disk at L4-5.   DESCRIPTION OF PROCEDURE:  The patient was brought to the operating room  supine on the stretcher.  After smooth induction of general endotracheal  anesthesia, she was turned prone.  The back was prepped with Duraprep and  draped in a sterile fashion.  A old incision was excised with elliptical  scar and then the dissection was carried down through the subcutaneous fat  to the lumbodorsal fascia.  The fascia was opened on the right side of the  midline to expose the interlaminar space at L4-5.  Then after releasing the  scar from the edges of the laminotomy defect at L4-5, identifying L4-5  positively  with a radiograph, the laminotomy was increased in size using a  high-speed air drill and 2.8 mm dissecting tool to remove the inferior  margin of lamina of L4 at the medial wall of the facet.  Partial facetectomy  was completed and the scar was released from around the edges and the common  dural tube was identified.  On the lateral aspect of the dural tube, the  edge of the nerve could not be identified clearly, so the entire mass was  retracted medially and this entered into a large free fragment of disk.  Mobilizing the fragment revealed a singular large fragment of disk that had  herniated itself into this right lateral recess.  This was removed this then  allowed for good mobilization of the common dural tube and the takeoff of  the L5 nerve root.  There was noted to be a thinned out area of dura with  some arachnoid poking through on the inferior side of the common tube at the  takeoff of the L5 nerve root.  This was carefully retracted for later  closure with 6-0 Prolene and the disk space was then explored, releasing  scar tissue and adhesions to the undersurface of dural tube.  Complete  diskectomy was performed through this opening using a combination of curets  and rongeurs to remove a number of large pieces of disk material from both  the ipsilateral and contralateral sides.  The procedure continued until no  other fragments of disk material could be obtained from this region.  The  area was carefully inspected with a series of probes to make sure that there  were no fragments of disk left anywhere near the opening in the ligament.  With this being completed, care was taken to inspect passage of the nerve  root.  A foraminotomy was created over the L5 nerve root as it entered the  foramen.  Epidural venous bleeding was controlled with the bipolar cautery  and some small pledgets of Gelfoam soaked in thrombin, which were later  irrigated away.  The dura was then closed with 6-0  Prolene in two simple  sutures.  No spinal fluid leakage was identified.  Finally 0.5 mL of  fentanyl along with 40 mg of Depo-Medrol was left in the epidural space.  The operating microscope was removed from the field.  It should be noted  that after the laminectomy defects were identified, the rest of the  procedure, including all of the dissection, was done under the microscope  with the microdissection technique.  The lumbodorsal fascia was then closed  with 1-0 Vicryl in interrupted fashion, 2-0 Vicryl used subcuticularly and 3-  0 Vicryl as the final subcuticular layer.  Dermabond was placed on the skin.  The patient tolerated the procedure well and was returned to the recovery  room in stable condition.       HJE/MEDQ  D:  06/04/2004  T:  06/04/2004  Job:  [id number]

## 2011-01-06 NOTE — Consult Note (Signed)
NAMEMARINELL, IGARASHI                ACCOUNT NO.:  [id number]   MEDICAL RECORD NO.:  [id number]          PATIENT TYPE:  INP   LOCATION:  3002                         FACILITY:  [facility]   PHYSICIAN:  Lonia Blood, M.D.DATE OF BIRTH:  [birth year]   DATE OF CONSULTATION:  04/17/2006  DATE OF DISCHARGE:                                   CONSULTATION   PRIMARY CARE PHYSICIAN:  Unassigned locally.   REASON FOR CONSULTATION:  Consultative care of diabetes, hypertension,  hypothyroidism in the perioperative period.   HISTORY OF PRESENT ILLNESS:  Ms. Girtrude Enslin is a 45 year old female who  has a significant recent history of recurrent herniated nucleus pulposus and  spondylosis at the L4-L5 region.  She has previously undergone two  corrective surgeries.  She has experienced recurrent difficulty in this  area.  After outpatient evaluation, she was admitted by Dr. Barnett Abu to  undergo her third corrective surgical intervention.  She underwent her  procedure on the afternoon of April 16, 2006, with no significant immediate  complications appreciable.  Dr. Danielle Dess has requested that the [facility] assist with her medical care during her hospital stay.  I am  presenting to evaluate the patient at approximately 6:00 in the morning on  April 17, 2006.  The patient is resting comfortably in her bed in the  postoperative state.  She has no specific complaints at this time.   REVIEW OF SYSTEMS:  Comprehensive review of systems is unremarkable at the  present time.   PAST MEDICAL HISTORY:  1. 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  2. Diabetes mellitus type 2.  3. Hypertension.  4. Hypothyroidism.  5. Obesity.  6. History of bowel ischemia, status post resection with staged      reanastomosis.   OUTPATIENT MEDICATIONS:  1. Glucophage 1000 mg b.i.d.  2. Glipizide 5 mg in the morning, 5 mg at lunch, and 10 mg in the  evening      with the evening meal.  3. Cozaar 50 mg daily.  4. Synthroid 175 mcg p.o. daily.  5. Elavil 25 mg p.o. at bedtime.  6. Aspirin 81 mg daily.   ALLERGIES:  No known drug allergies.   FAMILY HISTORY:  There are multiple family members who are positive for  diabetes and hypertension.  The patient's father has a significant history  of osteoporosis at the age of 3.   SOCIAL HISTORY:  The patient lives out of town.  She does not smoke.  She  does not drink.   DATA REVIEW:  There are no labs from today.  On April 11, 2006, the patient  had a normal BMET, though mildly elevated white count of 11,000.  Normal  hemoglobin, mildly decreased MCV at 77, and a platelet count of 332.  Chest  x-ray at the time revealed no acute disease.   PHYSICAL EXAMINATION:  VITAL SIGNS:  Temperature 98.2, blood pressure  106/64, respiratory rate 18, heart rate 71, O2 saturation is 98% on 2 liters  per minute nasal cannula.  GENERAL:  Well-developed, well-nourished obese female in no acute  respiratory distress.  LUNGS:  Clear to auscultation bilaterally, without wheezes or rhonchi.  CARDIOVASCULAR:  Regular rate and rhythm, without murmur, gallop, or rub.  Normal S1, S2.  ABDOMEN:  Obese, soft.  Bowel sounds present.  No hepatosplenomegaly.  No  rebound, no ascites.  EXTREMITIES:  Trace bilateral lower extremity edema.  NEUROLOGIC:  Alert and oriented x4.  Cranial nerves II-XII intact  bilaterally.   RECOMMENDATIONS:  1. Diabetes mellitus.  Very strict control of the patient's diabetes will      be a necessity to maximize wound healing.  At this time, I will      continue Glucophage and glipizide as previously dosed.  I will add      sliding scale insulin.  We will check CBGs on a q.a.c. and at bedtime      basis to ensure that the patient's blood pressure is very strictly      controlled.  Since the patient is awake and diet is being advanced, we      will discontinued  dextrose-containing IV fluid and will gently hydrate      with normal saline alone.  2. Hypertension.  The patient's blood pressure is currently mildly      diminished.  This is likely a component of mild hypovolemia as well as      narcotic pain medications.  I will hold Cozaar at the present time, as      the patient would be at increased risk for acute renal insufficiency in      the perioperative period.  We will follow her blood pressure closely.      If her blood pressure begins to trend upward, we will consider      consumption of Cozaar.  3. Hypothyroidism.  We will continue Synthroid at the present time.  TSH      is likely to be difficult to interpret at this time, and therefore I      will not check the patient's thyroid level.   Thank you very much for your consultation on this pleasant lady.  We are  happy to continue to follow her along with you.      Lonia Blood, M.D.  Electronically Signed     JTM/MEDQ  D:  04/17/2006  T:  04/17/2006  Job:  [id number]

## 2011-04-03 ENCOUNTER — Encounter: Payer: Self-pay | Admitting: *Deleted

## 2011-04-03 ENCOUNTER — Emergency Department (HOSPITAL_COMMUNITY): Payer: Medicare Other

## 2011-04-03 ENCOUNTER — Other Ambulatory Visit: Payer: Self-pay

## 2011-04-03 ENCOUNTER — Emergency Department (HOSPITAL_COMMUNITY)
Admission: EM | Admit: 2011-04-03 | Discharge: 2011-04-03 | Disposition: A | Discharge status: Home / Self Care | Payer: Medicare Other | Attending: Emergency Medicine | Admitting: Emergency Medicine

## 2011-04-03 DIAGNOSIS — S01501A Unspecified open wound of lip, initial encounter: Secondary | ICD-10-CM | POA: Insufficient documentation

## 2011-04-03 DIAGNOSIS — W19XXXA Unspecified fall, initial encounter: Secondary | ICD-10-CM | POA: Insufficient documentation

## 2011-04-03 DIAGNOSIS — T07XXXA Unspecified multiple injuries, initial encounter: Secondary | ICD-10-CM

## 2011-04-03 DIAGNOSIS — Y92009 Unspecified place in unspecified non-institutional (private) residence as the place of occurrence of the external cause: Secondary | ICD-10-CM | POA: Insufficient documentation

## 2011-04-03 DIAGNOSIS — M545 Low back pain, unspecified: Secondary | ICD-10-CM | POA: Insufficient documentation

## 2011-04-03 DIAGNOSIS — Z7982 Long term (current) use of aspirin: Secondary | ICD-10-CM | POA: Insufficient documentation

## 2011-04-03 DIAGNOSIS — R51 Headache: Secondary | ICD-10-CM | POA: Insufficient documentation

## 2011-04-03 DIAGNOSIS — IMO0002 Reserved for concepts with insufficient information to code with codable children: Secondary | ICD-10-CM | POA: Insufficient documentation

## 2011-04-03 DIAGNOSIS — Z794 Long term (current) use of insulin: Secondary | ICD-10-CM | POA: Insufficient documentation

## 2011-04-03 DIAGNOSIS — N949 Unspecified condition associated with female genital organs and menstrual cycle: Secondary | ICD-10-CM | POA: Insufficient documentation

## 2011-04-03 DIAGNOSIS — E119 Type 2 diabetes mellitus without complications: Secondary | ICD-10-CM | POA: Insufficient documentation

## 2011-04-03 HISTORY — DX: Disorder of thyroid, unspecified: E07.9

## 2011-04-03 HISTORY — DX: Rosacea, unspecified: L71.9

## 2011-04-03 HISTORY — DX: Dorsalgia, unspecified: M54.9

## 2011-04-03 LAB — BASIC METABOLIC PANEL
BUN: 5 mg/dL — ABNORMAL LOW (ref 6–23)
CO2: 20 mEq/L (ref 19–32)
Calcium: 8.8 mg/dL (ref 8.4–10.5)
Creatinine, Ser: <0.47 mg/dL — ABNORMAL LOW (ref 0.50–1.10)
Glucose, Bld: 242 mg/dL — ABNORMAL HIGH (ref 70–99)

## 2011-04-03 MED ORDER — TETANUS-DIPHTH-ACELL PERTUSSIS 5-2.5-18.5 LF-MCG/0.5 IM SUSP
0.5000 mL | Freq: Once | INTRAMUSCULAR | Status: AC
  Administered 2011-04-03: 0.5 mL via INTRAMUSCULAR
  Filled 2011-04-03: qty 0.5

## 2011-04-03 MED ORDER — METHOCARBAMOL 500 MG PO TABS: ORAL_TABLET | ORAL | Status: DC

## 2011-04-03 MED ORDER — PROMETHAZINE HCL 25 MG/ML IJ SOLN
12.5000 mg | Freq: Once | INTRAMUSCULAR | Status: AC
  Administered 2011-04-03: 11:00:00 via INTRAVENOUS
  Filled 2011-04-03: qty 1

## 2011-04-03 MED ORDER — SODIUM CHLORIDE 0.9 % IV SOLN
Freq: Once | INTRAVENOUS | Status: AC
  Administered 2011-04-03: 11:00:00 via INTRAVENOUS

## 2011-04-03 MED ORDER — HYDROCODONE-ACETAMINOPHEN 7.5-325 MG PO TABS: 1.0000 | ORAL_TABLET | ORAL | Status: AC | PRN

## 2011-04-03 MED ORDER — HYDROMORPHONE HCL 1 MG/ML IJ SOLN
1.0000 mg | Freq: Once | INTRAMUSCULAR | Status: AC
  Administered 2011-04-03: 1 mg via INTRAVENOUS
  Filled 2011-04-03: qty 1

## 2011-04-03 NOTE — ED Notes (Signed)
Patient states she has a headache and would like something to help with it. RN Tiffany ware.

## 2011-04-03 NOTE — Progress Notes (Signed)
Test results given to pt. C-collar removed. Plan discussed.

## 2011-04-03 NOTE — Progress Notes (Signed)
Pt return from CT. Pain improved after IV pain meds.

## 2011-04-03 NOTE — ED Notes (Signed)
Ice pack refilled, patient repositioned, resting comfortably

## 2011-04-03 NOTE — ED Provider Notes (Signed)
History     CSN: [id number] Arrival date & time: 04/03/2011  9:42 AM  Chief Complaint  Patient presents with  . Fall   Patient is a 45 y.o. female presenting with fall. The history is provided by the patient and the spouse.  Fall The accident occurred 1 to 2 hours ago. The fall occurred while walking. She landed on grass (gravel). The point of impact was the head and right shoulder (face). The pain is present in the head, neck and right shoulder. The pain is at a severity of 10/10. The pain is severe. She was ambulatory at the scene. There was no drug use involved in the accident. Associated symptoms include headaches. Pertinent negatives include no visual change, no abdominal pain, no bowel incontinence, no vomiting, no hematuria and no hearing loss. Exacerbated by: movement/certain positions. Treatment on scene includes a c-collar and a backboard. She has tried nothing for the symptoms.    Past Medical History  Diagnosis Date  . Diabetes mellitus   . High cholesterol   . Back pain   . Thyroid disease   . Rosacea     Past Surgical History  Procedure Date  . Cholecystectomy   . Endometrial ablation   . Back surgery   . Colon surgery   . Colostomy   . Revision colostomy     No family history on file.  History  Substance Use Topics  . Smoking status: Never Smoker   . Smokeless tobacco: Not on file  . Alcohol Use: No    OB History    Grav Para Term Preterm Abortions TAB SAB Ect Mult Living                  Review of Systems  Constitutional: Negative for activity change.       All ROS Neg except as noted in HPI  HENT: Negative for nosebleeds and neck pain.   Eyes: Negative for photophobia and discharge.  Respiratory: Negative for cough, shortness of breath and wheezing.   Cardiovascular: Negative for chest pain and palpitations.  Gastrointestinal: Negative for vomiting, abdominal pain, blood in stool and bowel incontinence.  Genitourinary: Negative for dysuria,  frequency and hematuria.  Musculoskeletal: Negative for back pain and arthralgias.  Skin: Negative.   Neurological: Positive for headaches. Negative for dizziness, seizures and speech difficulty.  Psychiatric/Behavioral: Negative for hallucinations and confusion.    Physical Exam  BP 124/71  Pulse 95  Temp(Src) 98.6 F (37 C) (Oral)  Resp 20  Ht 5\' 7"  (1.702 m)  Wt 255 lb (115.667 kg)  BMI 39.94 kg/m2  SpO2 96%  Physical Exam  Nursing note and vitals reviewed. Constitutional: She is oriented to person, place, and time. She appears well-developed and well-nourished.  Non-toxic appearance. She appears distressed.  HENT:  Right Ear: Tympanic membrane and external ear normal.  Left Ear: Tympanic membrane and external ear normal.  Nose: Sinus tenderness present. No nasal septal hematoma. No epistaxis.  No foreign bodies.       Multiple abrasions about the right face and forehead. Shallow laceration of the mucosa of the upper lip.  Eyes: EOM and lids are normal. Pupils are equal, round, and reactive to light. Right eye exhibits no discharge. Left eye exhibits no discharge.       Anterior chambers clear.   Neck: Carotid bruit is not present.       c collar and LSB in place.  Cardiovascular: Normal rate, regular rhythm, normal heart sounds, intact distal  pulses and normal pulses.   Pulmonary/Chest: Breath sounds normal. No respiratory distress. Rales: c collar and LSB. She exhibits tenderness.       Abrasions to the right breast. RT chest wall soreness. (chaperone present during exam.)  Abdominal: Soft. Bowel sounds are normal. There is no tenderness. There is no guarding.  Musculoskeletal: Normal range of motion.       Pain to palpation and attempted ROM of the right shoulder. No deformity. Abrasions of the left knee. FROM of the lower ext. No hip deformity or pain. Low back pain to palpation and to movement.  Lymphadenopathy:       Head (right side): No submandibular adenopathy  present.       Head (left side): No submandibular adenopathy present.    She has no cervical adenopathy.  Neurological: She is alert and oriented to person, place, and time. She has normal strength. No cranial nerve deficit or sensory deficit.  Skin: Skin is warm and dry.  Psychiatric: Her speech is normal. Her mood appears anxious.    ED Course  Procedures  MDM I have reviewed nursing notes, vital signs, and all appropriate lab and imaging results for this patient.      Kathie Dike, [place] 04/03/11 [phone]

## 2011-04-03 NOTE — Progress Notes (Signed)
Pt removed from LSB by me.

## 2011-04-03 NOTE — ED Notes (Signed)
Per EMS - pt fell in driveway this am.  States became dizzy and fell hitting head.  Denies LOC.  Pt ambulated back inside home and called 911.  C/o pain to head, face, right shoulder, right side, lower back, and left knee.  Pt alert and oriented.

## 2011-04-03 NOTE — ED Notes (Signed)
Ice pack applied to abrasions/contusions on patient's face per dr. Request

## 2011-04-04 NOTE — ED Provider Notes (Signed)
Medical screening examination/treatment/procedure(s) were performed by non-physician practitioner and as supervising physician I was immediately available for consultation/collaboration.   Matilyn Fehrman M Tawnie Ehresman, DO 04/04/11 1210 

## 2011-05-05 NOTE — ED Provider Notes (Signed)
History     CSN: [id number] Arrival date & time: 04/03/2011  9:42 AM   Chief Complaint  Patient presents with  . Fall     (Include location/radiation/quality/duration/timing/severity/associated sxs/prior treatment) The history is provided by the patient.   Per EMS - pt fell in driveway this am. States became dizzy and fell hitting head. Denies LOC. Pt ambulated back inside home and called 911. C/o pain to head, face, right shoulder, right side, lower back, and left knee. Pt alert and oriented.   Past Medical History  Diagnosis Date  . Diabetes mellitus   . High cholesterol   . Back pain   . Thyroid disease   . Rosacea      Past Surgical History  Procedure Date  . Cholecystectomy   . Endometrial ablation   . Back surgery   . Colon surgery   . Colostomy   . Revision colostomy     No family history on file.  History  Substance Use Topics  . Smoking status: Never Smoker   . Smokeless tobacco: Not on file  . Alcohol Use: No    OB History    Grav Para Term Preterm Abortions TAB SAB Ect Mult Living                  Review of Systems  Constitutional: Negative for fever and chills.  HENT: Negative for nosebleeds.   Eyes: Negative for redness.  Respiratory: Negative for cough and shortness of breath.   Cardiovascular: Negative for chest pain.  Gastrointestinal: Negative for abdominal pain.  Skin: Negative for rash.  Neurological: Positive for light-headedness and headaches.  Psychiatric/Behavioral: Negative for confusion.    Allergies  Chocolate  Home Medications   Current Outpatient Rx  Name Route Sig Dispense Refill  . ACETAMINOPHEN 500 MG PO TABS Oral Take 1,000 mg by mouth every 6 (six) hours as needed. For pain     . ASPIRIN 81 MG PO TBEC Oral Take 81 mg by mouth daily.      . ATORVASTATIN CALCIUM 10 MG PO TABS Oral Take 10 mg by mouth daily.      . CHOLECALCIFEROL 5000 UNITS PO CAPS Oral Take 5,000 Units by mouth daily.      Marland Kitchen DOXYCYCLINE HYCLATE  100 MG PO TABS Oral Take 100 mg by mouth 2 (two) times daily as needed. For rosacea     . ENALAPRIL MALEATE 5 MG PO TABS Oral Take 5 mg by mouth at bedtime.      Marland Kitchen GLIPIZIDE 5 MG PO TB24 Oral Take 5 mg by mouth daily.      . INSULIN ASPART 100 UNIT/ML [place] SOLN Subcutaneous Inject 24 Units into the skin 3 (three) times daily before meals.      . INSULIN GLARGINE 100 UNIT/ML [place] SOLN Subcutaneous Inject 60 Units into the skin 2 (two) times daily.      Marland Kitchen LEVOTHYROXINE SODIUM 175 MCG PO TABS Oral Take 175 mcg by mouth daily.      Marland Kitchen LIRAGLUTIDE 18 MG/3ML [place] SOLN Intramuscular Inject 1.8 mg into the muscle daily.      Marland Kitchen METFORMIN HCL 1000 MG PO TABS Oral Take 1,000 mg by mouth 2 (two) times daily.      Marland Kitchen METHOCARBAMOL 500 MG PO TABS Oral Take 500 mg by mouth every 6 (six) hours as needed. For muscle spasms     . METRONIDAZOLE 0.75 % EX CREA Topical Apply 1 application topically 2 (two) times daily as needed. For  rosacea     . METHOCARBAMOL 500 MG PO TABS  2 po tid for spasm/pain 30 tablet 0    Physical Exam    BP 124/71  Pulse 95  Temp(Src) 98.6 F (37 C) (Oral)  Resp 20  Ht 5\' 7"  (1.702 m)  Wt 255 lb (115.667 kg)  BMI 39.94 kg/m2  SpO2 96%  Physical Exam  Constitutional: She is oriented to person, place, and time. She appears well-developed and well-nourished.  HENT:  Head: Normocephalic.  Eyes: Conjunctivae are normal. Pupils are equal, round, and reactive to light.  Neck: Normal range of motion.  Cardiovascular: Normal rate.   Pulmonary/Chest: Effort normal.  Abdominal: Soft. Bowel sounds are normal.  Neurological: She is alert and oriented to person, place, and time.    ED Course  Procedures  Results for orders placed during the hospital encounter of 04/03/11  BASIC METABOLIC PANEL      Component Value Range   Sodium 136  135 - 145 (mEq/L)   Potassium 3.7  3.5 - 5.1 (mEq/L)   Chloride 102  96 - 112 (mEq/L)   CO2 20  19 - 32 (mEq/L)   Glucose, Bld 242 (*) 70 - 99 (mg/dL)    BUN 5 (*) 6 - 23 (mg/dL)   Creatinine, Ser <1.61 (*) 0.50 - 1.10 (mg/dL)   Calcium 8.8  8.4 - 09.6 (mg/dL)   GFR calc non Af Amer NOT CALCULATED  >60 (mL/min)   GFR calc Af Amer NOT CALCULATED  >60 (mL/min)   No results found.   1. Multiple contusions   2. Abrasions of multiple sites      MDM Fall Multiple abrasions       Nicholes Stairs, MD 05/10/11 [phone]

## 2011-05-22 LAB — CBC
Hemoglobin: 7.9 — CL
MCHC: 33.2
RDW: 14.3

## 2011-05-22 LAB — BASIC METABOLIC PANEL
CO2: 25
Calcium: 8.8
Creatinine, Ser: 0.48
Glucose, Bld: 118 — ABNORMAL HIGH
Sodium: 136

## 2011-05-22 LAB — URINALYSIS, ROUTINE W REFLEX MICROSCOPIC
Glucose, UA: NEGATIVE
Protein, ur: 100 — AB

## 2011-05-23 LAB — COMPREHENSIVE METABOLIC PANEL
ALT: 22
AST: 21
Albumin: 3.9
CO2: 22
Chloride: 100
GFR calc Af Amer: >60
GFR calc non Af Amer: >60
Sodium: 131 — ABNORMAL LOW
Total Bilirubin: 0.7

## 2011-05-23 LAB — GLUCOSE, CAPILLARY

## 2011-05-23 LAB — URINE MICROSCOPIC-ADD ON

## 2011-05-23 LAB — URINALYSIS, ROUTINE W REFLEX MICROSCOPIC
Glucose, UA: 250 — AB
Leukocytes, UA: NEGATIVE
Specific Gravity, Urine: <1.005 — ABNORMAL LOW
pH: 5.5

## 2011-05-23 LAB — CBC
RBC: 4.78
WBC: 8

## 2011-06-28 ENCOUNTER — Other Ambulatory Visit: Payer: Self-pay | Admitting: Obstetrics & Gynecology

## 2011-06-28 DIAGNOSIS — Z139 Encounter for screening, unspecified: Secondary | ICD-10-CM

## 2011-07-24 ENCOUNTER — Other Ambulatory Visit: Payer: Self-pay | Admitting: Obstetrics & Gynecology

## 2011-07-24 ENCOUNTER — Other Ambulatory Visit (HOSPITAL_COMMUNITY)
Admission: RE | Admit: 2011-07-24 | Discharge: 2011-07-24 | Disposition: A | Discharge status: Home / Self Care | Payer: Medicare Other | Source: Ambulatory Visit | Attending: Obstetrics & Gynecology | Admitting: Obstetrics & Gynecology

## 2011-07-24 DIAGNOSIS — Z124 Encounter for screening for malignant neoplasm of cervix: Secondary | ICD-10-CM | POA: Insufficient documentation

## 2011-08-28 ENCOUNTER — Ambulatory Visit (HOSPITAL_COMMUNITY)
Admission: RE | Admit: 2011-08-28 | Discharge: 2011-08-28 | Disposition: A | Discharge status: Home / Self Care | Payer: Medicare Other | Source: Ambulatory Visit | Attending: Obstetrics & Gynecology | Admitting: Obstetrics & Gynecology

## 2011-08-28 DIAGNOSIS — Z139 Encounter for screening, unspecified: Secondary | ICD-10-CM

## 2011-08-28 DIAGNOSIS — L0292 Furuncle, unspecified: Secondary | ICD-10-CM | POA: Diagnosis not present

## 2011-08-28 DIAGNOSIS — A499 Bacterial infection, unspecified: Secondary | ICD-10-CM | POA: Diagnosis not present

## 2011-08-28 DIAGNOSIS — Z1231 Encounter for screening mammogram for malignant neoplasm of breast: Secondary | ICD-10-CM | POA: Insufficient documentation

## 2011-09-20 DIAGNOSIS — E559 Vitamin D deficiency, unspecified: Secondary | ICD-10-CM | POA: Diagnosis not present

## 2011-09-20 DIAGNOSIS — E039 Hypothyroidism, unspecified: Secondary | ICD-10-CM | POA: Diagnosis not present

## 2011-09-20 DIAGNOSIS — E785 Hyperlipidemia, unspecified: Secondary | ICD-10-CM | POA: Diagnosis not present

## 2011-09-25 DIAGNOSIS — R809 Proteinuria, unspecified: Secondary | ICD-10-CM | POA: Diagnosis not present

## 2011-09-25 DIAGNOSIS — E1165 Type 2 diabetes mellitus with hyperglycemia: Secondary | ICD-10-CM | POA: Diagnosis not present

## 2011-09-25 DIAGNOSIS — E559 Vitamin D deficiency, unspecified: Secondary | ICD-10-CM | POA: Diagnosis not present

## 2011-09-25 DIAGNOSIS — Z794 Long term (current) use of insulin: Secondary | ICD-10-CM | POA: Diagnosis not present

## 2011-09-25 DIAGNOSIS — E039 Hypothyroidism, unspecified: Secondary | ICD-10-CM | POA: Diagnosis not present

## 2011-09-25 DIAGNOSIS — E1129 Type 2 diabetes mellitus with other diabetic kidney complication: Secondary | ICD-10-CM | POA: Diagnosis not present

## 2011-09-25 DIAGNOSIS — E785 Hyperlipidemia, unspecified: Secondary | ICD-10-CM | POA: Diagnosis not present

## 2011-09-25 DIAGNOSIS — E282 Polycystic ovarian syndrome: Secondary | ICD-10-CM | POA: Diagnosis not present

## 2011-09-28 ENCOUNTER — Telehealth (HOSPITAL_COMMUNITY): Payer: Self-pay | Admitting: Dietician

## 2011-09-28 NOTE — Telephone Encounter (Signed)
Pt's last appointment was 11/12/08. Pt reports that her endocrinologist, Dr. Rocky Crafts, recommended RD follow-up (pt to bring office note). Appointment scheduled for 09/29/11 at 9:30 PM

## 2011-09-29 ENCOUNTER — Encounter (HOSPITAL_COMMUNITY): Payer: Self-pay | Admitting: Dietician

## 2011-09-29 NOTE — Progress Notes (Signed)
Follow-Up Outpatient Nutrition Note Date: 09/29/2011  Time: 9:30 AM  Nutrition Assessment:   Weight: 255 lb (115.667 kg)   Body mass index is 39.94 kg/(m^2).  Weight changes: -2# (0.8%) x 3 years  [name] presents for follow-up. Her last appointment was on 11/12/08. She reports she saw her endocrinologist, Dr. Leslie Dales, and he suggested she follow-up with RD to review diabetes and weight management principles. She reports frustration with continues high blood sugars despite making several changes to her diet. She reports she has eliminated junk foods and substituted fruit, yogurt, and vegetables as snacks. She has started walking 1-2x daily with her husband in 30 minute increments. CBGs ran from 99-205 this week. She reports her blood sugars are "always up and down". Pt brought most recent office note, med sheet, CBG log, and lab sheet with her today; these were reviewed. Pt eats out about once a week in various restaurants in [place].  Labs: CMP     Component Value Date/Time   NA 136 04/03/2011 1031   K 3.7 04/03/2011 1031   CL 102 04/03/2011 1031   CO2 20 04/03/2011 1031   GLUCOSE 242* 04/03/2011 1031   BUN 5* 04/03/2011 1031   CREATININE <0.47* 04/03/2011 1031   CALCIUM 8.8 04/03/2011 1031   PROT 6.8 11/26/2008 1750   ALBUMIN 3.5 11/26/2008 1750   AST 26 11/26/2008 1750   ALT 29 11/26/2008 1750   ALKPHOS 62 11/26/2008 1750   BILITOT 0.6 11/26/2008 1750   GFRNONAA NOT CALCULATED 04/03/2011 1031   GFRAA NOT CALCULATED 04/03/2011 1031    Lipid Panel  No results found for this basename: chol, trig, hdl, cholhdl, vldl, ldlcalc     No results found for this basename: HGBA1C   Lab Results  Component Value Date   CREATININE <0.47* 04/03/2011    Per Dr. Berline Lopes records (labs from last visit 09/25/11)- Na: 136, K: 3.8, Cl: 102, Co2: 25, BUN: 7, Creat: 0.43, Glucose: 181, Total cholesterol: 84, Triglycerides: 70, HDL: 33, Hgb A1c: 9.1  Diet recall: Breakfast: cheerios with milk and splenda OR grilled  cheese sandwich; Snack (occasional): 4 nabs; Lunch: tossed salad with lettuce, shredded cheese, banana peppers, tuna fish or chicken or [place], Ranch dressing; Snack: baby carrots; Dinner: meatloaf, string beans, small baked potato, biscuit   Nutrition Diagnosis: Excessive carbohydrate intake r/t increased portion sizes, snacks high in refined carbohydrates AEB Hgb A1c: 9.1.   Nutrition Intervention: Nutrition rx: 1500 kcal NAS, low fat, diabetic diet; 3 meals/day; 1-3 snacks per day; limit 1 starch per meal (1/4 plate); 65-78 minutes physical activity daily; low calorie beverages only  Education/ counseling provided: Educated pt on diabetic diet principles. Reviewed portion sizes and sources of carbohydrate. Educated pt on plate method. Discussed healthy food preparation method. Discussed healthy snack ideas. Discussed importance of regular physical activity to assist with weight loss. Provided plate method and managing your diabetes handouts. Also provided additional CBG log book.  Understanding/Motivation/ Ability to follow recommendations: Expect fair to good compliance.  Monitoring and Evaluation: Goals: 1) 1-2# weight loss per week; 2) 30-60 minutes physical activity daily; 3) Hgb A1c < 6.5  Recommendations: 1) For weight loss: 1554-1741 kcals daily; 2) Break up exercise into smaller, more frequent intervals; 3) Try low fat, light yogurt for snack  F/U: PRN. Provided RD contact information.   Melody Haver, RD, LDN  Date:09/29/2011 Time: 9:30 AM

## 2011-11-14 DIAGNOSIS — I10 Essential (primary) hypertension: Secondary | ICD-10-CM | POA: Diagnosis not present

## 2011-11-14 DIAGNOSIS — E119 Type 2 diabetes mellitus without complications: Secondary | ICD-10-CM | POA: Diagnosis not present

## 2011-12-20 DIAGNOSIS — E785 Hyperlipidemia, unspecified: Secondary | ICD-10-CM | POA: Diagnosis not present

## 2011-12-20 DIAGNOSIS — E559 Vitamin D deficiency, unspecified: Secondary | ICD-10-CM | POA: Diagnosis not present

## 2011-12-20 DIAGNOSIS — E1129 Type 2 diabetes mellitus with other diabetic kidney complication: Secondary | ICD-10-CM | POA: Diagnosis not present

## 2011-12-20 DIAGNOSIS — E039 Hypothyroidism, unspecified: Secondary | ICD-10-CM | POA: Diagnosis not present

## 2011-12-25 DIAGNOSIS — E282 Polycystic ovarian syndrome: Secondary | ICD-10-CM | POA: Diagnosis not present

## 2011-12-25 DIAGNOSIS — E785 Hyperlipidemia, unspecified: Secondary | ICD-10-CM | POA: Diagnosis not present

## 2011-12-25 DIAGNOSIS — E1165 Type 2 diabetes mellitus with hyperglycemia: Secondary | ICD-10-CM | POA: Diagnosis not present

## 2011-12-25 DIAGNOSIS — E039 Hypothyroidism, unspecified: Secondary | ICD-10-CM | POA: Diagnosis not present

## 2011-12-25 DIAGNOSIS — E559 Vitamin D deficiency, unspecified: Secondary | ICD-10-CM | POA: Diagnosis not present

## 2011-12-25 DIAGNOSIS — E1129 Type 2 diabetes mellitus with other diabetic kidney complication: Secondary | ICD-10-CM | POA: Diagnosis not present

## 2012-03-15 DIAGNOSIS — E785 Hyperlipidemia, unspecified: Secondary | ICD-10-CM | POA: Diagnosis not present

## 2012-03-15 DIAGNOSIS — E119 Type 2 diabetes mellitus without complications: Secondary | ICD-10-CM | POA: Diagnosis not present

## 2012-03-26 DIAGNOSIS — E109 Type 1 diabetes mellitus without complications: Secondary | ICD-10-CM | POA: Diagnosis not present

## 2012-03-27 DIAGNOSIS — E785 Hyperlipidemia, unspecified: Secondary | ICD-10-CM | POA: Diagnosis not present

## 2012-03-27 DIAGNOSIS — E1129 Type 2 diabetes mellitus with other diabetic kidney complication: Secondary | ICD-10-CM | POA: Diagnosis not present

## 2012-03-27 DIAGNOSIS — E039 Hypothyroidism, unspecified: Secondary | ICD-10-CM | POA: Diagnosis not present

## 2012-03-27 DIAGNOSIS — R809 Proteinuria, unspecified: Secondary | ICD-10-CM | POA: Diagnosis not present

## 2012-04-01 DIAGNOSIS — E282 Polycystic ovarian syndrome: Secondary | ICD-10-CM | POA: Diagnosis not present

## 2012-04-01 DIAGNOSIS — R809 Proteinuria, unspecified: Secondary | ICD-10-CM | POA: Diagnosis not present

## 2012-04-01 DIAGNOSIS — E1129 Type 2 diabetes mellitus with other diabetic kidney complication: Secondary | ICD-10-CM | POA: Diagnosis not present

## 2012-04-01 DIAGNOSIS — E039 Hypothyroidism, unspecified: Secondary | ICD-10-CM | POA: Diagnosis not present

## 2012-04-01 DIAGNOSIS — R7309 Other abnormal glucose: Secondary | ICD-10-CM | POA: Diagnosis not present

## 2012-04-01 DIAGNOSIS — E559 Vitamin D deficiency, unspecified: Secondary | ICD-10-CM | POA: Diagnosis not present

## 2012-04-01 DIAGNOSIS — E785 Hyperlipidemia, unspecified: Secondary | ICD-10-CM | POA: Diagnosis not present

## 2012-05-03 DIAGNOSIS — R809 Proteinuria, unspecified: Secondary | ICD-10-CM | POA: Diagnosis not present

## 2012-05-03 DIAGNOSIS — R7309 Other abnormal glucose: Secondary | ICD-10-CM | POA: Diagnosis not present

## 2012-05-03 DIAGNOSIS — E559 Vitamin D deficiency, unspecified: Secondary | ICD-10-CM | POA: Diagnosis not present

## 2012-05-03 DIAGNOSIS — E282 Polycystic ovarian syndrome: Secondary | ICD-10-CM | POA: Diagnosis not present

## 2012-05-03 DIAGNOSIS — E1129 Type 2 diabetes mellitus with other diabetic kidney complication: Secondary | ICD-10-CM | POA: Diagnosis not present

## 2012-05-03 DIAGNOSIS — E039 Hypothyroidism, unspecified: Secondary | ICD-10-CM | POA: Diagnosis not present

## 2012-05-03 DIAGNOSIS — E1165 Type 2 diabetes mellitus with hyperglycemia: Secondary | ICD-10-CM | POA: Diagnosis not present

## 2012-05-03 DIAGNOSIS — E785 Hyperlipidemia, unspecified: Secondary | ICD-10-CM | POA: Diagnosis not present

## 2012-06-12 DIAGNOSIS — Z23 Encounter for immunization: Secondary | ICD-10-CM | POA: Diagnosis not present

## 2012-07-09 DIAGNOSIS — R809 Proteinuria, unspecified: Secondary | ICD-10-CM | POA: Diagnosis not present

## 2012-07-09 DIAGNOSIS — E1165 Type 2 diabetes mellitus with hyperglycemia: Secondary | ICD-10-CM | POA: Diagnosis not present

## 2012-07-09 DIAGNOSIS — E039 Hypothyroidism, unspecified: Secondary | ICD-10-CM | POA: Diagnosis not present

## 2012-07-09 DIAGNOSIS — E785 Hyperlipidemia, unspecified: Secondary | ICD-10-CM | POA: Diagnosis not present

## 2012-07-09 DIAGNOSIS — E1129 Type 2 diabetes mellitus with other diabetic kidney complication: Secondary | ICD-10-CM | POA: Diagnosis not present

## 2012-07-09 DIAGNOSIS — E559 Vitamin D deficiency, unspecified: Secondary | ICD-10-CM | POA: Diagnosis not present

## 2012-07-11 DIAGNOSIS — E559 Vitamin D deficiency, unspecified: Secondary | ICD-10-CM | POA: Diagnosis not present

## 2012-07-11 DIAGNOSIS — E1129 Type 2 diabetes mellitus with other diabetic kidney complication: Secondary | ICD-10-CM | POA: Diagnosis not present

## 2012-07-11 DIAGNOSIS — R809 Proteinuria, unspecified: Secondary | ICD-10-CM | POA: Diagnosis not present

## 2012-07-11 DIAGNOSIS — E785 Hyperlipidemia, unspecified: Secondary | ICD-10-CM | POA: Diagnosis not present

## 2012-07-11 DIAGNOSIS — E282 Polycystic ovarian syndrome: Secondary | ICD-10-CM | POA: Diagnosis not present

## 2012-07-11 DIAGNOSIS — R7309 Other abnormal glucose: Secondary | ICD-10-CM | POA: Diagnosis not present

## 2012-07-11 DIAGNOSIS — E039 Hypothyroidism, unspecified: Secondary | ICD-10-CM | POA: Diagnosis not present

## 2012-07-16 DIAGNOSIS — E785 Hyperlipidemia, unspecified: Secondary | ICD-10-CM | POA: Diagnosis not present

## 2012-07-16 DIAGNOSIS — E119 Type 2 diabetes mellitus without complications: Secondary | ICD-10-CM | POA: Diagnosis not present

## 2012-07-23 ENCOUNTER — Other Ambulatory Visit: Payer: Self-pay | Admitting: Family Medicine

## 2012-07-23 ENCOUNTER — Other Ambulatory Visit: Payer: Self-pay | Admitting: Obstetrics & Gynecology

## 2012-07-23 DIAGNOSIS — Z139 Encounter for screening, unspecified: Secondary | ICD-10-CM

## 2012-07-26 ENCOUNTER — Other Ambulatory Visit (HOSPITAL_COMMUNITY)
Admission: RE | Admit: 2012-07-26 | Discharge: 2012-07-26 | Disposition: A | Discharge status: Home / Self Care | Payer: Medicare Other | Source: Ambulatory Visit | Attending: Obstetrics & Gynecology | Admitting: Obstetrics & Gynecology

## 2012-07-26 DIAGNOSIS — Z124 Encounter for screening for malignant neoplasm of cervix: Secondary | ICD-10-CM | POA: Insufficient documentation

## 2012-07-31 ENCOUNTER — Encounter (HOSPITAL_COMMUNITY): Payer: Self-pay | Admitting: Emergency Medicine

## 2012-07-31 ENCOUNTER — Emergency Department (HOSPITAL_COMMUNITY)
Admission: EM | Admit: 2012-07-31 | Discharge: 2012-07-31 | Disposition: A | Discharge status: Home / Self Care | Payer: Medicare Other | Source: Home / Self Care | Attending: Emergency Medicine | Admitting: Emergency Medicine

## 2012-07-31 DIAGNOSIS — Z933 Colostomy status: Secondary | ICD-10-CM | POA: Insufficient documentation

## 2012-07-31 DIAGNOSIS — K2900 Acute gastritis without bleeding: Secondary | ICD-10-CM | POA: Diagnosis not present

## 2012-07-31 DIAGNOSIS — K2970 Gastritis, unspecified, without bleeding: Secondary | ICD-10-CM | POA: Insufficient documentation

## 2012-07-31 DIAGNOSIS — Z7982 Long term (current) use of aspirin: Secondary | ICD-10-CM | POA: Insufficient documentation

## 2012-07-31 DIAGNOSIS — Z79899 Other long term (current) drug therapy: Secondary | ICD-10-CM | POA: Insufficient documentation

## 2012-07-31 DIAGNOSIS — Z794 Long term (current) use of insulin: Secondary | ICD-10-CM | POA: Insufficient documentation

## 2012-07-31 DIAGNOSIS — E079 Disorder of thyroid, unspecified: Secondary | ICD-10-CM | POA: Insufficient documentation

## 2012-07-31 DIAGNOSIS — E119 Type 2 diabetes mellitus without complications: Secondary | ICD-10-CM | POA: Insufficient documentation

## 2012-07-31 DIAGNOSIS — Z8619 Personal history of other infectious and parasitic diseases: Secondary | ICD-10-CM | POA: Insufficient documentation

## 2012-07-31 DIAGNOSIS — Z9889 Other specified postprocedural states: Secondary | ICD-10-CM | POA: Insufficient documentation

## 2012-07-31 DIAGNOSIS — R110 Nausea: Secondary | ICD-10-CM | POA: Insufficient documentation

## 2012-07-31 LAB — COMPREHENSIVE METABOLIC PANEL
AST: 15 U/L (ref 0–37)
Albumin: 4 g/dL (ref 3.5–5.2)
BUN: 8 mg/dL (ref 6–23)
Calcium: 9.5 mg/dL (ref 8.4–10.5)
Creatinine, Ser: 0.4 mg/dL — ABNORMAL LOW (ref 0.50–1.10)
Total Bilirubin: 1.1 mg/dL (ref 0.3–1.2)
Total Protein: 7.5 g/dL (ref 6.0–8.3)

## 2012-07-31 LAB — CBC WITH DIFFERENTIAL/PLATELET
Eosinophils Absolute: 0 10*3/uL (ref 0.0–0.7)
Hemoglobin: 15.6 g/dL — ABNORMAL HIGH (ref 12.0–15.0)
Lymphocytes Relative: 16 % (ref 12–46)
Lymphs Abs: 2 10*3/uL (ref 0.7–4.0)
MCH: 30.3 pg (ref 26.0–34.0)
MCV: 87 fL (ref 78.0–100.0)
Monocytes Relative: 6 % (ref 3–12)
Neutrophils Relative %: 77 % (ref 43–77)
RBC: 5.15 MIL/uL — ABNORMAL HIGH (ref 3.87–5.11)
WBC: 12.2 10*3/uL — ABNORMAL HIGH (ref 4.0–10.5)

## 2012-07-31 MED ORDER — PROMETHAZINE HCL 25 MG PO TABS: 25.0000 mg | ORAL_TABLET | Freq: Four times a day (QID) | ORAL | Status: DC | PRN

## 2012-07-31 MED ORDER — HYDROCODONE-ACETAMINOPHEN 5-325 MG PO TABS: 1.0000 | ORAL_TABLET | Freq: Four times a day (QID) | ORAL | Status: DC | PRN

## 2012-07-31 MED ORDER — PANTOPRAZOLE SODIUM 20 MG PO TBEC: 40.0000 mg | DELAYED_RELEASE_TABLET | Freq: Every day | ORAL | Status: DC

## 2012-07-31 MED ORDER — ONDANSETRON HCL 4 MG/2ML IJ SOLN
4.0000 mg | Freq: Once | INTRAMUSCULAR | Status: AC
  Administered 2012-07-31: 4 mg via INTRAVENOUS
  Filled 2012-07-31: qty 2

## 2012-07-31 MED ORDER — PANTOPRAZOLE SODIUM 40 MG IV SOLR
40.0000 mg | Freq: Once | INTRAVENOUS | Status: AC
  Administered 2012-07-31: 40 mg via INTRAVENOUS
  Filled 2012-07-31: qty 40

## 2012-07-31 MED ORDER — HYDROMORPHONE HCL PF 1 MG/ML IJ SOLN
1.0000 mg | Freq: Once | INTRAMUSCULAR | Status: AC
  Administered 2012-07-31: 1 mg via INTRAVENOUS
  Filled 2012-07-31: qty 1

## 2012-07-31 NOTE — ED Notes (Signed)
Pt c/o mid epigastric pain with n/v today. Pt states she was told she had a small hiatal hernia and erosion in 2010.

## 2012-07-31 NOTE — ED Notes (Signed)
Discharge instructions given and reviewed with patient.  Prescriptions given for Vicodin, Phenergan and Protonix; effects and use explained.  Patient verbalized understanding of sedating effects of Phenergan and Vicodin.  Patient discharged home in good condition via wheelchair.

## 2012-07-31 NOTE — ED Provider Notes (Signed)
History     CSN: [id number]  Arrival date & time 07/31/12  1836   First MD Initiated Contact with Patient 07/31/12 1843      Chief Complaint  Patient presents with  . Abdominal Pain    (Consider location/radiation/quality/duration/timing/severity/associated sxs/prior treatment) Patient is a 46 y.o. female presenting with abdominal pain. The history is provided by the patient (the pt complains of abd pain and vomiting). No language interpreter was used.  Abdominal Pain The primary symptoms of the illness include abdominal pain and nausea. The primary symptoms of the illness do not include fatigue or diarrhea. The current episode started 2 days ago. The onset of the illness was sudden. The problem has not changed since onset. Associated with: nothing. The patient states that she believes she is currently not pregnant. The patient has not had a change in bowel habit. Symptoms associated with the illness do not include chills, hematuria, frequency or back pain. Significant associated medical issues include GERD.    Past Medical History  Diagnosis Date  . Diabetes mellitus   . Back pain   . Thyroid disease   . Rosacea     Past Surgical History  Procedure Date  . Cholecystectomy   . Endometrial ablation   . Back surgery   . Colon surgery   . Colostomy   . Revision colostomy     No family history on file.  History  Substance Use Topics  . Smoking status: Never Smoker   . Smokeless tobacco: Not on file  . Alcohol Use: No    OB History    Grav Para Term Preterm Abortions TAB SAB Ect Mult Living                  Review of Systems  Constitutional: Negative for chills and fatigue.  HENT: Negative for congestion, sinus pressure and ear discharge.   Eyes: Negative for discharge.  Respiratory: Negative for cough.   Cardiovascular: Negative for chest pain.  Gastrointestinal: Positive for nausea and abdominal pain. Negative for diarrhea.  Genitourinary: Negative for  frequency and hematuria.  Musculoskeletal: Negative for back pain.  Skin: Negative for rash.  Neurological: Negative for seizures and headaches.  Hematological: Negative.   Psychiatric/Behavioral: Negative for hallucinations.    Allergies  Chocolate and Sulfa antibiotics  Home Medications   Current Outpatient Rx  Name  Route  Sig  Dispense  Refill  . ACETAMINOPHEN 500 MG PO TABS   Oral   Take 1,000 mg by mouth every 6 (six) hours as needed. For pain          . ASPIRIN 81 MG PO TBEC   Oral   Take 81 mg by mouth daily.           . ATORVASTATIN CALCIUM 10 MG PO TABS   Oral   Take 10 mg by mouth daily.           . CHOLECALCIFEROL 5000 UNITS PO CAPS   Oral   Take 5,000 Units by mouth daily.           Marland Kitchen DOXYCYCLINE HYCLATE 100 MG PO TABS   Oral   Take 100 mg by mouth 2 (two) times daily as needed. For rosacea          . ENALAPRIL MALEATE 5 MG PO TABS   Oral   Take 5 mg by mouth at bedtime.           Marland Kitchen HYDROCODONE-ACETAMINOPHEN 5-325 MG PO TABS   Oral  Take 1 tablet by mouth every 6 (six) hours as needed for pain.   20 tablet   0   . INSULIN GLARGINE 100 UNIT/ML [place] SOLN   Subcutaneous   Inject 60 Units into the skin 2 (two) times daily.           Marland Kitchen LEVOTHYROXINE SODIUM 175 MCG PO TABS   Oral   Take 175 mcg by mouth daily.           Marland Kitchen LIRAGLUTIDE 18 MG/3ML [place] SOLN   Intramuscular   Inject 1.8 mg into the muscle daily.           Marland Kitchen METFORMIN HCL 1000 MG PO TABS   Oral   Take 1,000 mg by mouth 2 (two) times daily.           Marland Kitchen METHOCARBAMOL 500 MG PO TABS   Oral   Take 500 mg by mouth every 6 (six) hours as needed. For muscle spasms          . METRONIDAZOLE 0.75 % EX CREA   Topical   Apply 1 application topically 2 (two) times daily as needed. For rosacea          . PANTOPRAZOLE SODIUM 20 MG PO TBEC   Oral   Take 2 tablets (40 mg total) by mouth daily.   30 tablet   0   . PROMETHAZINE HCL 25 MG PO TABS   Oral   Take 1 tablet  (25 mg total) by mouth every 6 (six) hours as needed for nausea.   15 tablet   0     BP 146/82  Pulse 91  Temp 97.8 F (36.6 C) (Oral)  Resp 20  Ht 5\' 6"  (1.676 m)  Wt 233 lb (105.688 kg)  BMI 37.61 kg/m2  SpO2 99%  Physical Exam  Constitutional: She is oriented to person, place, and time. She appears well-developed.  HENT:  Head: Normocephalic and atraumatic.  Eyes: Conjunctivae normal and EOM are normal. No scleral icterus.  Neck: Neck supple. No thyromegaly present.  Cardiovascular: Normal rate and regular rhythm.  Exam reveals no gallop and no friction rub.   No murmur heard. Pulmonary/Chest: No stridor. She has no wheezes. She has no rales. She exhibits no tenderness.  Abdominal: She exhibits no distension. There is tenderness. There is no rebound.  Musculoskeletal: Normal range of motion. She exhibits no edema.  Lymphadenopathy:    She has no cervical adenopathy.  Neurological: She is oriented to person, place, and time. Coordination normal.  Skin: No rash noted. No erythema.  Psychiatric: She has a normal mood and affect. Her behavior is normal.    ED Course  Procedures (including critical care time)  Labs Reviewed  CBC WITH DIFFERENTIAL - Abnormal; Notable for the following:    WBC 12.2 (*)     RBC 5.15 (*)     Hemoglobin 15.6 (*)     Neutro Abs 9.4 (*)     All other components within normal limits  COMPREHENSIVE METABOLIC PANEL - Abnormal; Notable for the following:    Glucose, Bld 115 (*)     Creatinine, Ser 0.40 (*)     All other components within normal limits   No results found.   1. Gastritis       MDM          Benny Lennert, MD 07/31/12 2102

## 2012-08-02 ENCOUNTER — Inpatient Hospital Stay (HOSPITAL_COMMUNITY)
Admission: EM | Admit: 2012-08-02 | Discharge: 2012-08-10 | DRG: 354 | Disposition: A | Discharge status: Home / Self Care | Payer: Medicare Other | Attending: General Surgery | Admitting: General Surgery

## 2012-08-02 ENCOUNTER — Encounter (HOSPITAL_COMMUNITY): Payer: Self-pay | Admitting: *Deleted

## 2012-08-02 ENCOUNTER — Emergency Department (HOSPITAL_COMMUNITY): Payer: Medicare Other

## 2012-08-02 DIAGNOSIS — M549 Dorsalgia, unspecified: Secondary | ICD-10-CM | POA: Diagnosis not present

## 2012-08-02 DIAGNOSIS — K929 Disease of digestive system, unspecified: Secondary | ICD-10-CM | POA: Diagnosis not present

## 2012-08-02 DIAGNOSIS — Z79899 Other long term (current) drug therapy: Secondary | ICD-10-CM

## 2012-08-02 DIAGNOSIS — K560 Paralytic ileus: Secondary | ICD-10-CM | POA: Diagnosis not present

## 2012-08-02 DIAGNOSIS — E119 Type 2 diabetes mellitus without complications: Secondary | ICD-10-CM | POA: Diagnosis not present

## 2012-08-02 DIAGNOSIS — Z882 Allergy status to sulfonamides status: Secondary | ICD-10-CM | POA: Diagnosis not present

## 2012-08-02 DIAGNOSIS — Y834 Other reconstructive surgery as the cause of abnormal reaction of the patient, or of later complication, without mention of misadventure at the time of the procedure: Secondary | ICD-10-CM | POA: Diagnosis present

## 2012-08-02 DIAGNOSIS — R109 Unspecified abdominal pain: Secondary | ICD-10-CM

## 2012-08-02 DIAGNOSIS — G8929 Other chronic pain: Secondary | ICD-10-CM | POA: Diagnosis not present

## 2012-08-02 DIAGNOSIS — R1084 Generalized abdominal pain: Secondary | ICD-10-CM | POA: Diagnosis not present

## 2012-08-02 DIAGNOSIS — L719 Rosacea, unspecified: Secondary | ICD-10-CM | POA: Diagnosis present

## 2012-08-02 DIAGNOSIS — E039 Hypothyroidism, unspecified: Secondary | ICD-10-CM | POA: Diagnosis not present

## 2012-08-02 DIAGNOSIS — B370 Candidal stomatitis: Secondary | ICD-10-CM | POA: Diagnosis not present

## 2012-08-02 DIAGNOSIS — Z6835 Body mass index (BMI) 35.0-35.9, adult: Secondary | ICD-10-CM | POA: Diagnosis not present

## 2012-08-02 DIAGNOSIS — Z7982 Long term (current) use of aspirin: Secondary | ICD-10-CM

## 2012-08-02 DIAGNOSIS — Z9089 Acquired absence of other organs: Secondary | ICD-10-CM | POA: Diagnosis not present

## 2012-08-02 DIAGNOSIS — Y9210 Unspecified residential institution as the place of occurrence of the external cause: Secondary | ICD-10-CM | POA: Diagnosis present

## 2012-08-02 DIAGNOSIS — Z794 Long term (current) use of insulin: Secondary | ICD-10-CM

## 2012-08-02 DIAGNOSIS — K436 Other and unspecified ventral hernia with obstruction, without gangrene: Secondary | ICD-10-CM | POA: Diagnosis not present

## 2012-08-02 DIAGNOSIS — E876 Hypokalemia: Secondary | ICD-10-CM | POA: Diagnosis not present

## 2012-08-02 DIAGNOSIS — E669 Obesity, unspecified: Secondary | ICD-10-CM | POA: Diagnosis present

## 2012-08-02 DIAGNOSIS — Z23 Encounter for immunization: Secondary | ICD-10-CM

## 2012-08-02 LAB — URINE MICROSCOPIC-ADD ON

## 2012-08-02 LAB — COMPREHENSIVE METABOLIC PANEL
ALT: 15 U/L (ref 0–35)
Alkaline Phosphatase: 74 U/L (ref 39–117)
BUN: 15 mg/dL (ref 6–23)
CO2: 17 mEq/L — ABNORMAL LOW (ref 19–32)
Calcium: 10.2 mg/dL (ref 8.4–10.5)
GFR calc Af Amer: >90 mL/min (ref >90)
GFR calc non Af Amer: >90 mL/min (ref >90)
Glucose, Bld: 139 mg/dL — ABNORMAL HIGH (ref 70–99)
Potassium: 4.3 mEq/L (ref 3.5–5.1)
Sodium: 131 mEq/L — ABNORMAL LOW (ref 135–145)

## 2012-08-02 LAB — CBC WITH DIFFERENTIAL/PLATELET
Eosinophils Absolute: 0 10*3/uL (ref 0.0–0.7)
Eosinophils Relative: 0 % (ref 0–5)
Hemoglobin: 17.2 g/dL — ABNORMAL HIGH (ref 12.0–15.0)
Lymphocytes Relative: 19 % (ref 12–46)
Lymphs Abs: 3 10*3/uL (ref 0.7–4.0)
MCH: 30.7 pg (ref 26.0–34.0)
MCV: 88.6 fL (ref 78.0–100.0)
Monocytes Relative: 7 % (ref 3–12)
Platelets: 357 10*3/uL (ref 150–400)
RBC: 5.6 MIL/uL — ABNORMAL HIGH (ref 3.87–5.11)
WBC: 15.4 10*3/uL — ABNORMAL HIGH (ref 4.0–10.5)

## 2012-08-02 LAB — GLUCOSE, CAPILLARY: Glucose-Capillary: 133 mg/dL — ABNORMAL HIGH (ref 70–99)

## 2012-08-02 LAB — URINALYSIS, ROUTINE W REFLEX MICROSCOPIC
Glucose, UA: >1000 mg/dL — AB
Ketones, ur: >80 mg/dL — AB
Urobilinogen, UA: 0.2 mg/dL (ref 0.0–1.0)

## 2012-08-02 LAB — LIPASE, BLOOD: Lipase: 51 U/L (ref 11–59)

## 2012-08-02 MED ORDER — ONDANSETRON HCL 4 MG/2ML IJ SOLN
4.0000 mg | Freq: Once | INTRAMUSCULAR | Status: AC
  Administered 2012-08-02: 4 mg via INTRAVENOUS
  Filled 2012-08-02: qty 2

## 2012-08-02 MED ORDER — METRONIDAZOLE IN NACL 5-0.79 MG/ML-% IV SOLN
500.0000 mg | Freq: Once | INTRAVENOUS | Status: AC
  Administered 2012-08-02: 500 mg via INTRAVENOUS
  Filled 2012-08-02: qty 100

## 2012-08-02 MED ORDER — SODIUM CHLORIDE 0.9 % IV BOLUS (SEPSIS)
1000.0000 mL | Freq: Once | INTRAVENOUS | Status: AC
  Administered 2012-08-02: 1000 mL via INTRAVENOUS

## 2012-08-02 MED ORDER — IOHEXOL 300 MG/ML  SOLN
20.0000 mL | INTRAMUSCULAR | Status: DC
  Administered 2012-08-02: 20 mL via ORAL

## 2012-08-02 MED ORDER — HYDROMORPHONE HCL PF 1 MG/ML IJ SOLN
1.0000 mg | Freq: Once | INTRAMUSCULAR | Status: AC
  Administered 2012-08-02: 1 mg via INTRAVENOUS
  Filled 2012-08-02: qty 1

## 2012-08-02 MED ORDER — SODIUM CHLORIDE 0.9 % IV BOLUS (SEPSIS)
1000.0000 mL | Freq: Once | INTRAVENOUS | Status: AC
  Administered 2012-08-03: 1000 mL via INTRAVENOUS

## 2012-08-02 MED ORDER — HYDROMORPHONE HCL PF 1 MG/ML IJ SOLN: 1.0000 mg | Freq: Once | INTRAMUSCULAR | Status: DC

## 2012-08-02 MED ORDER — ONDANSETRON HCL 4 MG/2ML IJ SOLN
4.0000 mg | Freq: Once | INTRAMUSCULAR | Status: AC
  Administered 2012-08-03: 4 mg via INTRAVENOUS
  Filled 2012-08-02: qty 2

## 2012-08-02 MED ORDER — CIPROFLOXACIN IN D5W 400 MG/200ML IV SOLN
400.0000 mg | Freq: Once | INTRAVENOUS | Status: AC
  Administered 2012-08-02: 400 mg via INTRAVENOUS
  Filled 2012-08-02: qty 200

## 2012-08-02 NOTE — ED Provider Notes (Addendum)
History   This chart was scribed for Nancy Quarry, MD, by Frederik Pear, ER scribe. The patient was seen in room APA11/APA11 and the patient's care was started at 1400.    CSN: [id number]  Arrival date & time 08/02/12  1221   First MD Initiated Contact with Patient 08/02/12 1400      Chief Complaint  Patient presents with  . Abdominal Pain    (Consider location/radiation/quality/duration/timing/severity/associated sxs/prior treatment) HPI Comments: Nancy [name] is a 46 y.o. female who presents to the Emergency Department complaining of constant, sharp abdominal pain with associated emesis and nausea that began 2 days ago at 2 am. She states that the pain woke her up from sleep, and she came to the ED later that day and diagnosed with gastritis. Earlier today, she was seen by Dr. Ouida Sills who referred her to the ED. She denies any cough, fever, or diarrhea. She states that she has only been able to minimally eat and drink since the pain began, but denies that eating aggravates it. She states that her last monthly period was before her endometrial ablation several years ago. 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 Sandria Bales. [place] where 3 in of her colon was removed. She has a h/o of DM and is allergic to some sulfa antibiotics.  PCP is Dr. Ouida Sills. Cholecystectomy performed by Dr. Lovell Sheehan. GI is Dr. Jena Gauss.     Past Medical History  Diagnosis Date  . Diabetes mellitus   . Back pain   . Thyroid disease   . Rosacea     Past Surgical History  Procedure Date  . Cholecystectomy   . Endometrial ablation   . Back surgery   . Colon surgery   . Colostomy   . Revision colostomy     History reviewed. No pertinent family history.  History  Substance Use Topics  . Smoking status: Never Smoker   . Smokeless tobacco: Not on file  . Alcohol Use: No    OB History    Grav Para Term Preterm  Abortions TAB SAB Ect Mult Living                  Review of Systems  Respiratory: Negative for cough.   Gastrointestinal: Positive for nausea, vomiting and abdominal pain. Negative for diarrhea.  All other systems reviewed and are negative.    Allergies  Chocolate and Sulfa antibiotics  Home Medications   Current Outpatient Rx  Name  Route  Sig  Dispense  Refill  . ASPIRIN 81 MG PO TBEC   Oral   Take 81 mg by mouth every evening.          . ATORVASTATIN CALCIUM 10 MG PO TABS   Oral   Take 10 mg by mouth at bedtime.          Marland Kitchen CANAGLIFLOZIN 300 MG PO TABS   Oral   Take 300 mg by mouth every morning. Before breakfast         . CHOLECALCIFEROL 5000 UNITS PO CAPS   Oral   Take 5,000 Units by mouth every morning. Vitamin D         . DOXYCYCLINE HYCLATE 100 MG PO TABS   Oral   Take 100 mg by mouth 2 (two) times daily as needed. For rosacea          . ENALAPRIL MALEATE 5 MG PO TABS  Oral   Take 5 mg by mouth at bedtime.           Marland Kitchen HYDROCODONE-ACETAMINOPHEN 5-325 MG PO TABS   Oral   Take 1 tablet by mouth every 6 (six) hours as needed for pain.   20 tablet   0   . LEVOTHYROXINE SODIUM 175 MCG PO TABS   Oral   Take 175 mcg by mouth every morning.          Marland Kitchen METFORMIN HCL 1000 MG PO TABS   Oral   Take 1,000 mg by mouth 2 (two) times daily.           Marland Kitchen METHOCARBAMOL 500 MG PO TABS   Oral   Take 500 mg by mouth every 8 (eight) hours as needed. For muscle spasms         . PANTOPRAZOLE SODIUM 20 MG PO TBEC   Oral   Take 2 tablets (40 mg total) by mouth daily.   30 tablet   0   . PROMETHAZINE HCL 25 MG PO TABS   Oral   Take 1 tablet (25 mg total) by mouth every 6 (six) hours as needed for nausea.   15 tablet   0   . ACETAMINOPHEN 500 MG PO TABS   Oral   Take 1,000 mg by mouth every 6 (six) hours as needed. For pain          . INSULIN GLARGINE 100 UNIT/ML [place] SOLN   Subcutaneous   Inject 60 Units into the skin 2 (two) times daily.            Marland Kitchen LIRAGLUTIDE 18 MG/3ML [place] SOLN   Subcutaneous   Inject 1.8 mg into the skin at bedtime.          Marland Kitchen METRONIDAZOLE 0.75 % EX CREA   Topical   Apply 1 application topically 2 (two) times daily as needed. For rosacea            BP 122/87  Pulse 118  Temp 97.3 F (36.3 C) (Oral)  Resp 20  Ht 5\' 6"  (1.676 m)  Wt 222 lb (100.699 kg)  BMI 35.83 kg/m2  SpO2 98%  Physical Exam  Nursing note and vitals reviewed. Constitutional: She appears well-developed and well-nourished.  HENT:  Head: Normocephalic and atraumatic.  Eyes: Conjunctivae normal and EOM are normal. Pupils are equal, round, and reactive to light.  Neck: Normal range of motion. Neck supple.  Cardiovascular: Normal rate, regular rhythm, normal heart sounds and intact distal pulses.   Pulmonary/Chest: Effort normal and breath sounds normal.  Abdominal: Soft. Bowel sounds are normal.       She has epigastric pain and mid-abdominal pain, which is more severe.  Musculoskeletal: Normal range of motion.  Neurological: She is alert.  Skin: Skin is warm and dry.  Psychiatric: She has a normal mood and affect. Thought content normal.    ED Course  Procedures (including critical care time)  DIAGNOSTIC STUDIES: Oxygen Saturation is 98% on room air, normal by my interpretation.    COORDINATION OF CARE:  14:29- Discussed planned course of treatment with the patient, including IV fluids, UA, abdomen X-rays, and a consult with Dr. Ouida Sills, who is agreeable at this time.  14:30- Medication Orders- sodium chloride 0.9% bolus 1,000 mL-Once, Hydromorphone (Dilaudid) injection 1 mg-Once, ondansetron (Zofran) injection 4 mg- Once, ciprofloxacin (Cipro) IVPB 400 mg- Once, metronidazole (Flagyl) IVPB 500 mg- Once.  Results for orders placed during the hospital encounter of 08/02/12  CBC WITH DIFFERENTIAL      Component Value Range   WBC 15.4 (*) 4.0 - 10.5 K/uL   RBC 5.60 (*) 3.87 - 5.11 MIL/uL   Hemoglobin 17.2 (*)  12.0 - 15.0 g/dL   HCT 16.1 (*) 09.6 - 04.5 %   MCV 88.6  78.0 - 100.0 fL   MCH 30.7  26.0 - 34.0 pg   MCHC 34.7  30.0 - 36.0 g/dL   RDW 40.9  81.1 - 91.4 %   Platelets 357  150 - 400 K/uL   Neutrophils Relative 73  43 - 77 %   Neutro Abs 11.3 (*) 1.7 - 7.7 K/uL   Lymphocytes Relative 19  12 - 46 %   Lymphs Abs 3.0  0.7 - 4.0 K/uL   Monocytes Relative 7  3 - 12 %   Monocytes Absolute 1.1 (*) 0.1 - 1.0 K/uL   Eosinophils Relative 0  0 - 5 %   Eosinophils Absolute 0.0  0.0 - 0.7 K/uL   Basophils Relative 0  0 - 1 %   Basophils Absolute 0.0  0.0 - 0.1 K/uL  COMPREHENSIVE METABOLIC PANEL      Component Value Range   Sodium 131 (*) 135 - 145 mEq/L   Potassium 4.3  3.5 - 5.1 mEq/L   Chloride 95 (*) 96 - 112 mEq/L   CO2 17 (*) 19 - 32 mEq/L   Glucose, Bld 139 (*) 70 - 99 mg/dL   BUN 15  6 - 23 mg/dL   Creatinine, Ser 7.82  0.50 - 1.10 mg/dL   Calcium 95.6  8.4 - 21.3 mg/dL   Total Protein 8.6 (*) 6.0 - 8.3 g/dL   Albumin 4.3  3.5 - 5.2 g/dL   AST 12  0 - 37 U/L   ALT 15  0 - 35 U/L   Alkaline Phosphatase 74  39 - 117 U/L   Total Bilirubin 1.4 (*) 0.3 - 1.2 mg/dL   GFR calc non Af Amer >90  >90 mL/min   GFR calc Af Amer >90  >90 mL/min  LIPASE, BLOOD      Component Value Range   Lipase 51  11 - 59 U/L  AMYLASE      Component Value Range   Amylase 100  0 - 105 U/L    Labs Reviewed  CBC WITH DIFFERENTIAL - Abnormal; Notable for the following:    WBC 15.4 (*)     RBC 5.60 (*)     Hemoglobin 17.2 (*)     HCT 49.6 (*)     Neutro Abs 11.3 (*)     Monocytes Absolute 1.1 (*)     All other components within normal limits  COMPREHENSIVE METABOLIC PANEL - Abnormal; Notable for the following:    Sodium 131 (*)     Chloride 95 (*)     CO2 17 (*)     Glucose, Bld 139 (*)     Total Protein 8.6 (*)     Total Bilirubin 1.4 (*)     All other components within normal limits  LIPASE, BLOOD  AMYLASE  URINALYSIS, ROUTINE W REFLEX MICROSCOPIC   US Abdomen Limited Ruq  08/02/2012   *RADIOLOGY REPORT*  Clinical Data:  Right-sided abdominal pain.  Prior cholecystectomy.  LIMITED ABDOMINAL ULTRASOUND - RIGHT UPPER QUADRANT  Comparison:  None.  Findings:  Gallbladder:  Surgically absent  Common bile duct:  Measures 7 mm in diameter, which is within normal limits status post cholecystectomy.  Liver:  Within normal limits in parenchymal echogenicity.  No mass identified.  IMPRESSION: Prior cholecystectomy.  No evidence of biliary dilatation.                    Original Report Authenticated By: Myles Rosenthal, M.D.      No diagnosis found.    MDM  I personally performed the services described in this documentation, which was scribed in my presence. The recorded information has been reviewed and considered.   Discussed above results with Dr. Ouida Sills and Lowella Dandy.  Plan transfer to [facility] cone for CT of the abdomen. I have discussed the patient's care with PA Arthor Captain and patient will be transferred to CDU to [facility] to have the CDU of her abdomen. She has had Cipro and Flagyl ordered here. 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 She has had pain for several days and has an elevated white blood cell count.      Nancy Quarry, MD 08/02/12 1543  Patient continues to await transport.  Patient had abdominal pain return and received additional dilaudid iv for pain and aware of plans.  She voices understanding.    Nancy Quarry, MD 08/02/12 [phone]

## 2012-08-02 NOTE — ED Notes (Addendum)
abd  Pain since Wednesday, seen here Wednesday for this pain and is no better.  Seen by Dr Ouida Sills today and sent here.  Nausea, vomiting.  No diarrhea.   Sob at times.

## 2012-08-02 NOTE — ED Notes (Signed)
Add-on for previous note.  [facility] called for transport to [facility] ED, CDU.  They will send a truck as soon as one is available.

## 2012-08-02 NOTE — ED Notes (Signed)
CBG 133 

## 2012-08-02 NOTE — ED Notes (Signed)
[facility] still is aware of patient and will be picking her up as soon as they get a truck available

## 2012-08-02 NOTE — ED Notes (Signed)
[facility] called for transport to

## 2012-08-02 NOTE — ED Notes (Signed)
Pt received PO contrast for CT

## 2012-08-03 ENCOUNTER — Encounter (HOSPITAL_COMMUNITY): Payer: Self-pay | Admitting: *Deleted

## 2012-08-03 ENCOUNTER — Encounter (HOSPITAL_COMMUNITY): Payer: Self-pay | Admitting: Anesthesiology

## 2012-08-03 ENCOUNTER — Observation Stay (HOSPITAL_COMMUNITY): Payer: Medicare Other

## 2012-08-03 ENCOUNTER — Encounter (HOSPITAL_COMMUNITY): Admission: EM | Disposition: A | Discharge status: Home / Self Care | Payer: Self-pay | Source: Home / Self Care

## 2012-08-03 ENCOUNTER — Inpatient Hospital Stay (HOSPITAL_COMMUNITY): Payer: Medicare Other | Admitting: Anesthesiology

## 2012-08-03 DIAGNOSIS — Z882 Allergy status to sulfonamides status: Secondary | ICD-10-CM | POA: Diagnosis not present

## 2012-08-03 DIAGNOSIS — E119 Type 2 diabetes mellitus without complications: Secondary | ICD-10-CM | POA: Diagnosis not present

## 2012-08-03 DIAGNOSIS — Z794 Long term (current) use of insulin: Secondary | ICD-10-CM | POA: Diagnosis not present

## 2012-08-03 DIAGNOSIS — Z79899 Other long term (current) drug therapy: Secondary | ICD-10-CM | POA: Diagnosis not present

## 2012-08-03 DIAGNOSIS — R109 Unspecified abdominal pain: Secondary | ICD-10-CM | POA: Diagnosis not present

## 2012-08-03 DIAGNOSIS — Z9089 Acquired absence of other organs: Secondary | ICD-10-CM | POA: Diagnosis not present

## 2012-08-03 DIAGNOSIS — N281 Cyst of kidney, acquired: Secondary | ICD-10-CM | POA: Diagnosis not present

## 2012-08-03 DIAGNOSIS — K560 Paralytic ileus: Secondary | ICD-10-CM | POA: Diagnosis not present

## 2012-08-03 DIAGNOSIS — E669 Obesity, unspecified: Secondary | ICD-10-CM | POA: Diagnosis present

## 2012-08-03 DIAGNOSIS — K436 Other and unspecified ventral hernia with obstruction, without gangrene: Secondary | ICD-10-CM | POA: Diagnosis not present

## 2012-08-03 DIAGNOSIS — Z7982 Long term (current) use of aspirin: Secondary | ICD-10-CM | POA: Diagnosis not present

## 2012-08-03 DIAGNOSIS — R1032 Left lower quadrant pain: Secondary | ICD-10-CM | POA: Diagnosis not present

## 2012-08-03 DIAGNOSIS — M549 Dorsalgia, unspecified: Secondary | ICD-10-CM | POA: Diagnosis present

## 2012-08-03 DIAGNOSIS — K929 Disease of digestive system, unspecified: Secondary | ICD-10-CM | POA: Diagnosis not present

## 2012-08-03 DIAGNOSIS — K439 Ventral hernia without obstruction or gangrene: Secondary | ICD-10-CM | POA: Diagnosis not present

## 2012-08-03 DIAGNOSIS — K56609 Unspecified intestinal obstruction, unspecified as to partial versus complete obstruction: Secondary | ICD-10-CM | POA: Diagnosis not present

## 2012-08-03 DIAGNOSIS — G8929 Other chronic pain: Secondary | ICD-10-CM | POA: Diagnosis present

## 2012-08-03 DIAGNOSIS — E039 Hypothyroidism, unspecified: Secondary | ICD-10-CM | POA: Diagnosis present

## 2012-08-03 DIAGNOSIS — B370 Candidal stomatitis: Secondary | ICD-10-CM | POA: Diagnosis not present

## 2012-08-03 DIAGNOSIS — E876 Hypokalemia: Secondary | ICD-10-CM | POA: Diagnosis not present

## 2012-08-03 DIAGNOSIS — L719 Rosacea, unspecified: Secondary | ICD-10-CM | POA: Diagnosis present

## 2012-08-03 DIAGNOSIS — Z6835 Body mass index (BMI) 35.0-35.9, adult: Secondary | ICD-10-CM | POA: Diagnosis not present

## 2012-08-03 DIAGNOSIS — Z23 Encounter for immunization: Secondary | ICD-10-CM | POA: Diagnosis not present

## 2012-08-03 DIAGNOSIS — R000 Tachycardia, unspecified: Secondary | ICD-10-CM | POA: Diagnosis not present

## 2012-08-03 HISTORY — PX: INSERTION OF MESH: SHX5868

## 2012-08-03 HISTORY — PX: LAPAROTOMY: SHX154

## 2012-08-03 HISTORY — PX: VENTRAL HERNIA REPAIR: SHX424

## 2012-08-03 LAB — SALICYLATE LEVEL: Salicylate Lvl: <2 mg/dL — ABNORMAL LOW (ref 2.8–20.0)

## 2012-08-03 LAB — CG4 I-STAT (LACTIC ACID): Lactic Acid, Venous: 1.45 mmol/L (ref 0.5–2.2)

## 2012-08-03 LAB — GLUCOSE, CAPILLARY
Glucose-Capillary: 127 mg/dL — ABNORMAL HIGH (ref 70–99)
Glucose-Capillary: 173 mg/dL — ABNORMAL HIGH (ref 70–99)

## 2012-08-03 LAB — SURGICAL PCR SCREEN
MRSA, PCR: NEGATIVE
Staphylococcus aureus: NEGATIVE

## 2012-08-03 SURGERY — REPAIR, HERNIA, VENTRAL
Anesthesia: General | Site: Abdomen | Wound class: Clean

## 2012-08-03 MED ORDER — SODIUM CHLORIDE 0.9 % IJ SOLN: 9.0000 mL | INTRAMUSCULAR | Status: DC | PRN

## 2012-08-03 MED ORDER — ENOXAPARIN SODIUM 40 MG/0.4ML ~~LOC~~ SOLN
40.0000 mg | SUBCUTANEOUS | Status: DC
  Administered 2012-08-04 – 2012-08-10 (×7): 40 mg via SUBCUTANEOUS
  Filled 2012-08-03 (×7): qty 0.4

## 2012-08-03 MED ORDER — ROCURONIUM BROMIDE 100 MG/10ML IV SOLN
INTRAVENOUS | Status: DC | PRN
  Administered 2012-08-03: 50 mg via INTRAVENOUS

## 2012-08-03 MED ORDER — SODIUM CHLORIDE 0.9 % IV SOLN: Freq: Once | INTRAVENOUS | Status: DC

## 2012-08-03 MED ORDER — ONDANSETRON HCL 4 MG/2ML IJ SOLN: 4.0000 mg | Freq: Four times a day (QID) | INTRAMUSCULAR | Status: DC | PRN

## 2012-08-03 MED ORDER — HYDROMORPHONE HCL PF 1 MG/ML IJ SOLN
1.0000 mg | Freq: Once | INTRAMUSCULAR | Status: AC
  Administered 2012-08-03: 1 mg via INTRAVENOUS
  Filled 2012-08-03: qty 1

## 2012-08-03 MED ORDER — CEFOXITIN SODIUM 2 G IV SOLR
2.0000 g | INTRAVENOUS | Status: AC
  Administered 2012-08-03: 2 g via INTRAVENOUS
  Filled 2012-08-03 (×3): qty 2

## 2012-08-03 MED ORDER — CEFAZOLIN SODIUM-DEXTROSE 2-3 GM-% IV SOLR: 2.0000 g | Freq: Once | INTRAVENOUS | Status: DC

## 2012-08-03 MED ORDER — FENTANYL CITRATE 0.05 MG/ML IJ SOLN
INTRAMUSCULAR | Status: DC | PRN
  Administered 2012-08-03: 100 ug via INTRAVENOUS
  Administered 2012-08-03 (×2): 50 ug via INTRAVENOUS
  Administered 2012-08-03: 75 ug via INTRAVENOUS
  Administered 2012-08-03 (×3): 50 ug via INTRAVENOUS

## 2012-08-03 MED ORDER — DROPERIDOL 2.5 MG/ML IJ SOLN
INTRAMUSCULAR | Status: DC | PRN
  Administered 2012-08-03: 0.625 mg via INTRAVENOUS

## 2012-08-03 MED ORDER — ONDANSETRON HCL 4 MG/2ML IJ SOLN
4.0000 mg | Freq: Once | INTRAMUSCULAR | Status: AC
  Administered 2012-08-03: 4 mg via INTRAVENOUS
  Filled 2012-08-03 (×2): qty 2

## 2012-08-03 MED ORDER — HYDROMORPHONE HCL PF 1 MG/ML IJ SOLN
1.0000 mg | INTRAMUSCULAR | Status: DC | PRN
  Administered 2012-08-03: 1 mg via INTRAVENOUS
  Filled 2012-08-03: qty 1

## 2012-08-03 MED ORDER — GLYCOPYRROLATE 0.2 MG/ML IJ SOLN
INTRAMUSCULAR | Status: DC | PRN
  Administered 2012-08-03: .8 mg via INTRAVENOUS

## 2012-08-03 MED ORDER — CIPROFLOXACIN IN D5W 400 MG/200ML IV SOLN
400.0000 mg | Freq: Two times a day (BID) | INTRAVENOUS | Status: AC
  Administered 2012-08-03: 400 mg via INTRAVENOUS
  Filled 2012-08-03: qty 200

## 2012-08-03 MED ORDER — PANTOPRAZOLE SODIUM 40 MG IV SOLR
40.0000 mg | Freq: Every day | INTRAVENOUS | Status: DC
  Administered 2012-08-03 – 2012-08-09 (×6): 40 mg via INTRAVENOUS
  Filled 2012-08-03 (×8): qty 40

## 2012-08-03 MED ORDER — PROPOFOL 10 MG/ML IV BOLUS
INTRAVENOUS | Status: DC | PRN
  Administered 2012-08-03: 200 mg via INTRAVENOUS

## 2012-08-03 MED ORDER — SODIUM CHLORIDE 0.9 % IR SOLN
Status: DC | PRN
  Administered 2012-08-03: 13:00:00

## 2012-08-03 MED ORDER — HYDROMORPHONE 0.3 MG/ML IV SOLN
INTRAVENOUS | Status: AC
  Administered 2012-08-03: 21:00:00
  Filled 2012-08-03: qty 25

## 2012-08-03 MED ORDER — SUCCINYLCHOLINE CHLORIDE 20 MG/ML IJ SOLN
INTRAMUSCULAR | Status: DC | PRN
  Administered 2012-08-03: 140 mg via INTRAVENOUS

## 2012-08-03 MED ORDER — DIPHENHYDRAMINE HCL 50 MG/ML IJ SOLN
12.5000 mg | Freq: Four times a day (QID) | INTRAMUSCULAR | Status: DC | PRN
  Administered 2012-08-04: 12.5 mg via INTRAVENOUS
  Filled 2012-08-03: qty 1

## 2012-08-03 MED ORDER — LIDOCAINE HCL (CARDIAC) 20 MG/ML IV SOLN
INTRAVENOUS | Status: DC | PRN
  Administered 2012-08-03: 80 mg via INTRAVENOUS

## 2012-08-03 MED ORDER — ARTIFICIAL TEARS OP OINT
TOPICAL_OINTMENT | OPHTHALMIC | Status: DC | PRN
  Administered 2012-08-03: 1 via OPHTHALMIC

## 2012-08-03 MED ORDER — HYDROMORPHONE 0.3 MG/ML IV SOLN
INTRAVENOUS | Status: DC
  Administered 2012-08-03: 16:00:00 via INTRAVENOUS
  Administered 2012-08-03: 22 mg via INTRAVENOUS
  Administered 2012-08-04: 10:00:00 via INTRAVENOUS
  Administered 2012-08-04: 7.5 mg via INTRAVENOUS
  Administered 2012-08-04: 14 mg via INTRAVENOUS
  Administered 2012-08-04: 04:00:00 via INTRAVENOUS
  Administered 2012-08-04: 25 mg via INTRAVENOUS
  Administered 2012-08-05 (×2): 0.3 mg via INTRAVENOUS
  Administered 2012-08-05: 9 mg via INTRAVENOUS
  Administered 2012-08-05: 05:00:00 via INTRAVENOUS
  Filled 2012-08-03 (×5): qty 25

## 2012-08-03 MED ORDER — ONDANSETRON HCL 4 MG/2ML IJ SOLN
INTRAMUSCULAR | Status: DC | PRN
  Administered 2012-08-03: 4 mg via INTRAVENOUS

## 2012-08-03 MED ORDER — VECURONIUM BROMIDE 10 MG IV SOLR
INTRAVENOUS | Status: DC | PRN
  Administered 2012-08-03: 2 mg via INTRAVENOUS
  Administered 2012-08-03: 1 mg via INTRAVENOUS
  Administered 2012-08-03: 2 mg via INTRAVENOUS

## 2012-08-03 MED ORDER — PNEUMOCOCCAL VAC POLYVALENT 25 MCG/0.5ML IJ INJ
0.5000 mL | INJECTION | INTRAMUSCULAR | Status: AC
  Administered 2012-08-04: 0.5 mL via INTRAMUSCULAR
  Filled 2012-08-03: qty 0.5

## 2012-08-03 MED ORDER — DIPHENHYDRAMINE HCL 12.5 MG/5ML PO ELIX
12.5000 mg | ORAL_SOLUTION | Freq: Four times a day (QID) | ORAL | Status: DC | PRN
  Filled 2012-08-03: qty 5

## 2012-08-03 MED ORDER — MIDAZOLAM HCL 5 MG/5ML IJ SOLN
INTRAMUSCULAR | Status: DC | PRN
  Administered 2012-08-03: 2 mg via INTRAVENOUS

## 2012-08-03 MED ORDER — CHLORHEXIDINE GLUCONATE 0.12 % MT SOLN: 15.0000 mL | Freq: Two times a day (BID) | OROMUCOSAL | Status: DC

## 2012-08-03 MED ORDER — IOHEXOL 300 MG/ML  SOLN
100.0000 mL | Freq: Once | INTRAMUSCULAR | Status: AC | PRN
  Administered 2012-08-03: 100 mL via INTRAVENOUS

## 2012-08-03 MED ORDER — LACTATED RINGERS IV SOLN
INTRAVENOUS | Status: DC | PRN
  Administered 2012-08-03 (×3): via INTRAVENOUS

## 2012-08-03 MED ORDER — BIOTENE DRY MOUTH MT LIQD: 15.0000 mL | Freq: Two times a day (BID) | OROMUCOSAL | Status: DC

## 2012-08-03 MED ORDER — OXYCODONE HCL 5 MG/5ML PO SOLN: 5.0000 mg | Freq: Once | ORAL | Status: AC | PRN

## 2012-08-03 MED ORDER — OXYCODONE HCL 5 MG PO TABS: 5.0000 mg | ORAL_TABLET | Freq: Once | ORAL | Status: AC | PRN

## 2012-08-03 MED ORDER — METOCLOPRAMIDE HCL 5 MG/ML IJ SOLN: 10.0000 mg | Freq: Once | INTRAMUSCULAR | Status: AC | PRN

## 2012-08-03 MED ORDER — NEOSTIGMINE METHYLSULFATE 1 MG/ML IJ SOLN
INTRAMUSCULAR | Status: DC | PRN
  Administered 2012-08-03: 4 mg via INTRAVENOUS

## 2012-08-03 MED ORDER — INSULIN ASPART 100 UNIT/ML ~~LOC~~ SOLN
0.0000 [IU] | SUBCUTANEOUS | Status: DC
  Administered 2012-08-03: 2 [IU] via SUBCUTANEOUS
  Administered 2012-08-04 (×2): 3 [IU] via SUBCUTANEOUS
  Administered 2012-08-05 (×4): 2 [IU] via SUBCUTANEOUS
  Administered 2012-08-06: 1 [IU] via SUBCUTANEOUS
  Administered 2012-08-06 (×4): 2 [IU] via SUBCUTANEOUS
  Administered 2012-08-07: 3 [IU] via SUBCUTANEOUS
  Administered 2012-08-07: 2 [IU] via SUBCUTANEOUS
  Administered 2012-08-07: 3 [IU] via SUBCUTANEOUS
  Administered 2012-08-07 – 2012-08-09 (×11): 2 [IU] via SUBCUTANEOUS
  Administered 2012-08-09: 1 [IU] via SUBCUTANEOUS

## 2012-08-03 MED ORDER — SODIUM CHLORIDE 0.9 % IV SOLN: INTRAVENOUS | Status: DC

## 2012-08-03 MED ORDER — HYDROMORPHONE HCL PF 1 MG/ML IJ SOLN: 0.2500 mg | INTRAMUSCULAR | Status: DC | PRN

## 2012-08-03 MED ORDER — INSULIN ASPART 100 UNIT/ML ~~LOC~~ SOLN
0.0000 [IU] | Freq: Three times a day (TID) | SUBCUTANEOUS | Status: DC
  Administered 2012-08-04: 5 [IU] via SUBCUTANEOUS
  Administered 2012-08-05: 3 [IU] via SUBCUTANEOUS

## 2012-08-03 MED ORDER — POTASSIUM CHLORIDE IN NACL 20-0.9 MEQ/L-% IV SOLN
INTRAVENOUS | Status: DC
  Administered 2012-08-03 – 2012-08-04 (×2): via INTRAVENOUS
  Administered 2012-08-05: 1000 mL via INTRAVENOUS
  Filled 2012-08-03 (×7): qty 1000

## 2012-08-03 MED ORDER — NALOXONE HCL 0.4 MG/ML IJ SOLN
0.4000 mg | INTRAMUSCULAR | Status: DC | PRN
  Administered 2012-08-05: 0.4 mg via INTRAVENOUS
  Filled 2012-08-03: qty 1

## 2012-08-03 SURGICAL SUPPLY — 53 items
BAG DECANTER FOR FLEXI CONT (MISCELLANEOUS) ×2 IMPLANT
BINDER ABD UNIV 12 45-62 (WOUND CARE) ×1 IMPLANT
BINDER ABDOMINAL 46IN 62IN (WOUND CARE) ×2
BLADE SURG ROTATE 9660 (MISCELLANEOUS) ×2 IMPLANT
CANISTER SUCTION 2500CC (MISCELLANEOUS) ×2 IMPLANT
CHLORAPREP W/TINT 26ML (MISCELLANEOUS) ×2 IMPLANT
CLOTH BEACON ORANGE TIMEOUT ST (SAFETY) ×2 IMPLANT
COVER SURGICAL LIGHT HANDLE (MISCELLANEOUS) ×2 IMPLANT
DRAIN CHANNEL 19F RND (DRAIN) ×2 IMPLANT
DRAPE LAPAROSCOPIC ABDOMINAL (DRAPES) ×2 IMPLANT
DRAPE UTILITY 15X26 W/TAPE STR (DRAPE) ×4 IMPLANT
DRSG PAD ABDOMINAL 8X10 ST (GAUZE/BANDAGES/DRESSINGS) ×2 IMPLANT
ELECT BLADE 6.5 EXT (BLADE) ×2 IMPLANT
ELECT CAUTERY BLADE 6.4 (BLADE) ×2 IMPLANT
ELECT REM PT RETURN 9FT ADLT (ELECTROSURGICAL) ×2
ELECTRODE REM PT RTRN 9FT ADLT (ELECTROSURGICAL) ×1 IMPLANT
EVACUATOR SILICONE 100CC (DRAIN) ×2 IMPLANT
GLOVE BIO SURGEON STRL SZ7 (GLOVE) ×2 IMPLANT
GLOVE BIOGEL PI IND STRL 7.0 (GLOVE) ×1 IMPLANT
GLOVE BIOGEL PI IND STRL 8 (GLOVE) ×3 IMPLANT
GLOVE BIOGEL PI INDICATOR 7.0 (GLOVE) ×1
GLOVE BIOGEL PI INDICATOR 8 (GLOVE) ×3
GLOVE ECLIPSE 7.5 STRL STRAW (GLOVE) ×2 IMPLANT
GLOVE ECLIPSE 8.0 STRL XLNG CF (GLOVE) ×2 IMPLANT
GOWN STRL NON-REIN LRG LVL3 (GOWN DISPOSABLE) ×8 IMPLANT
KIT BASIN OR (CUSTOM PROCEDURE TRAY) ×2 IMPLANT
KIT ROOM TURNOVER OR (KITS) ×2 IMPLANT
MESH PHYSIO OVAL 15X20CM (Mesh General) ×2 IMPLANT
NEEDLE HYPO 25GX1X1/2 BEV (NEEDLE) ×2 IMPLANT
NS IRRIG 1000ML POUR BTL (IV SOLUTION) ×2 IMPLANT
PACK GENERAL/GYN (CUSTOM PROCEDURE TRAY) ×2 IMPLANT
PAD ARMBOARD 7.5X6 YLW CONV (MISCELLANEOUS) ×4 IMPLANT
SPONGE GAUZE 4X4 12PLY (GAUZE/BANDAGES/DRESSINGS) ×2 IMPLANT
SPONGE LAP 18X18 X RAY DECT (DISPOSABLE) ×4 IMPLANT
STAPLER VISISTAT 35W (STAPLE) ×2 IMPLANT
SUT ETHIBOND NAB CTX #1 30IN (SUTURE) IMPLANT
SUT ETHILON 2 0 FS 18 (SUTURE) ×2 IMPLANT
SUT MNCRL AB 4-0 PS2 18 (SUTURE) ×2 IMPLANT
SUT NOVA 1 T20/GS 25DT (SUTURE) ×14 IMPLANT
SUT NOVA NAB DX-16 0-1 5-0 T12 (SUTURE) ×4 IMPLANT
SUT PROLENE 1 CT (SUTURE) IMPLANT
SUT SILK 2 0 (SUTURE) ×2
SUT SILK 2-0 18XBRD TIE 12 (SUTURE) ×1 IMPLANT
SUT VIC AB 2-0 CT1 27 (SUTURE) ×4
SUT VIC AB 2-0 CT1 TAPERPNT 27 (SUTURE) ×2 IMPLANT
SUT VIC AB 3-0 CT1 27 (SUTURE)
SUT VIC AB 3-0 CT1 TAPERPNT 27 (SUTURE) IMPLANT
SUT VICRYL AB 3 0 TIES (SUTURE) IMPLANT
SYR CONTROL 10ML LL (SYRINGE) ×2 IMPLANT
TAPE CLOTH SURG 6X10 WHT LF (GAUZE/BANDAGES/DRESSINGS) ×2 IMPLANT
TOWEL OR 17X24 6PK STRL BLUE (TOWEL DISPOSABLE) ×2 IMPLANT
TOWEL OR 17X26 10 PK STRL BLUE (TOWEL DISPOSABLE) ×2 IMPLANT
TRAY FOLEY CATH 14FRSI W/METER (CATHETERS) ×2 IMPLANT

## 2012-08-03 NOTE — ED Provider Notes (Signed)
  Physical Exam  BP 128/75  Pulse 97  Temp 98 F (36.7 C) (Oral)  Resp 16  Ht 5\' 6"  (1.676 m)  Wt 222 lb (100.699 kg)  BMI 35.83 kg/m2  SpO2 98%  Physical Exam  ED Course  Procedures  MDM Patient evaluated in the emergency department by myself. She has gradually worsening abdominal pain with nausea and vomiting since Wednesday. She had acutely worsening symptoms today and was transferred to this emergency department for a CT scan. On exam the patient has a morbidly obese abdomen which is generally soft and minimally tender. Her right lower quadrant does appear to be more tender, no definite hernia is palpated however on her CT scan there does appear to be an incarcerated postoperative hernia in the right lower quadrant causing a small bowel obstruction. She does have an increased anion gap acidosis, a normal lactic acid but she does have a leukocytosis as well. General surgery has been paged for consultation. The patient is n.p.o., she has been given Dilaudid and Zofran with some improvement.  Filed Vitals:   08/02/12 2231  BP: 128/75  Pulse: 97  Temp: 98 F (36.7 C)  Resp: 16    Most recent vital signs reflect that the patient does not have a fever or tachycardia.  Care discussed with Dr. Dwain Sarna who will come see the patient, NG tube ordered.      Vida Roller, MD 08/03/12 [phone]

## 2012-08-03 NOTE — Anesthesia Preprocedure Evaluation (Signed)
Anesthesia Evaluation  Patient identified by MRN, date of birth, ID band Patient awake    Reviewed: Allergy & Precautions, H&P , NPO status , Patient's Chart, lab work & pertinent test results, reviewed documented beta blocker date and time   Airway Mallampati: II TM Distance: >3 FB Neck ROM: full    Dental   Pulmonary neg pulmonary ROS,  breath sounds clear to auscultation        Cardiovascular negative cardio ROS  Rhythm:regular     Neuro/Psych negative neurological ROS  negative psych ROS   GI/Hepatic negative GI ROS, Neg liver ROS,   Endo/Other  diabetes, Insulin Dependent and Oral Hypoglycemic Agents  Renal/GU negative Renal ROS  negative genitourinary   Musculoskeletal   Abdominal   Peds  Hematology negative hematology ROS (+)   Anesthesia Other Findings See surgeon's H&P   Reproductive/Obstetrics negative OB ROS                           Anesthesia Physical Anesthesia Plan  ASA: III and emergent  Anesthesia Plan: General   Post-op Pain Management:    Induction: Intravenous, Rapid sequence and Cricoid pressure planned  Airway Management Planned: Oral ETT  Additional Equipment:   Intra-op Plan:   Post-operative Plan: Extubation in OR  Informed Consent: I have reviewed the patients History and Physical, chart, labs and discussed the procedure including the risks, benefits and alternatives for the proposed anesthesia with the patient or authorized representative who has indicated his/her understanding and acceptance.   Dental Advisory Given  Plan Discussed with: CRNA and Surgeon  Anesthesia Plan Comments:         Anesthesia Quick Evaluation

## 2012-08-03 NOTE — ED Notes (Signed)
Report called to charge nurse.  Will transport patient after change of shift.

## 2012-08-03 NOTE — Op Note (Signed)
OPERATIVE REPORT  DATE OF OPERATION: 08/02/2012 - 08/03/2012  PATIENT:  Nancy [name]  46 y.o. female  PRE-OPERATIVE DIAGNOSIS:  bowel obstruction with ventral hernia  POST-OPERATIVE DIAGNOSIS: Same with multiple fascial defects and extensive adhesions  PROCEDURE:  Procedure(s): HERNIA REPAIR VENTRAL ADULT  SURGEON:  Surgeon(s): [name], MD  ASSISTANT: Abbey Chatters  ANESTHESIA:   general  EBL: 250 ml  BLOOD ADMINISTERED: none  DRAINS: Nasogastric Tube, Urinary Catheter (Foley) and (one) Blake drain(s) in the subcutaneous space   SPECIMEN:  Source of Specimen:  resected hernia sac  COUNTS CORRECT:  YES  PROCEDURE DETAILS: The patient was taken to the operating room and placed on the table in the supine position. After an adequate general endotracheal anesthetic was administered she was prepped and draped in usual sterile manner exposing the entire abdomen particularly the lower abdomen below the umbilicus.  After proper time out was performed identifying the patient and the procedure to be performed a 9 incision was made using a #10 blade. We dissected down into the subcutaneous tissue using electrocautery and we encountered multiple hernia sacs and fascial defects. It took a while to dissect out these multiple defects largest which appeared to be in the lower portion of the abdomen towards the pubic crest. The largest was in the right lower portion of the abdomen with the next largest being in the left lower portion of the abdomen. These hernia sacs were dissected free and subsequently excised back to good fascia. It appears as though the patient had a previous ventral hernia repair in the upper portion of her incision on her some artificial material..  The multiple hernia defects were resected and the defects connected so that we to reapproximate the fascia as one single area. Flaps were developed circumferentially giving [place] adequate tissue in order to do an inlay mesh repair  with PhysioMesh.a 6 x 8" piece of PhysioMesh mesh was attached to the undersurface of the fascia using interrupted horizontal mattress sutures of #1 Novafil. A total of approximately 60 sutures are used to attach the mesh. Care was taken not to injure the bowel. Prior to doing the repair however significant amount of enterocleisis as necessary in order to relieve the patient and the bowel obstruction. Preoperatively he had been noted that the patient had a large hernia sac in the right lower quadrant which had a loop of small bowel contained within. This was the area where the obstruction appeared to occur however intraoperatively that bowel loop had reduced out of the hernia sac however it was still obstructed by thick band of adhesions which we subsequently removed allowing excellent flow.  We ran the small bowel to the terminal ileum and then proximally to the dilated bowel above the area of the obstruction. He seems as though the obstruction was more from the adhesions and not hernia itself. However the hernia was treated and repaired.  The mesh was in place once it had been attached circumferentially we irrigated with antibiotic solution. We closed the fascia on top of the mesh using interrupted #1 Novafil sutures. A subcutaneous single drain a 19 mm Blake was placed and secured in place in the right lower quadrant using a 3-0 nylon suture. The subcutaneous tissues were approximated on top of the drain using a running stitch of 2-0 Vicryl. Staples used to reapproximate the skin. All needle counts, sponge counts, and instrument counts were correct.  PATIENT DISPOSITION:  PACU - hemodynamically stable.   [name] 12/14/20133:18 PM

## 2012-08-03 NOTE — Transfer of Care (Signed)
Immediate Anesthesia Transfer of Care Note  Patient: Nancy [name]  Procedure(s) Performed: Procedure(s) (LRB) with comments: HERNIA REPAIR VENTRAL ADULT (N/A) EXPLORATORY LAPAROTOMY (N/A) - with extensive enterolysis INSERTION OF MESH (N/A)  Patient Location: PACU  Anesthesia Type:General  Level of Consciousness: sedated and patient cooperative  Airway & Oxygen Therapy: Patient Spontanous Breathing and Patient connected to nasal cannula oxygen  Post-op Assessment: Report given to PACU RN and Post -op Vital signs reviewed and stable  Post vital signs: Reviewed and stable  Complications: No apparent anesthesia complications

## 2012-08-03 NOTE — H&P (Signed)
Nancy [name] is an 46 y.o. female.   Chief Complaint: abdominal pain for 3 days, referred by Dr. Eber Hong HPI:  This is a 40 suture obese female who presents after a three-day history of abdominal pain that has been worsening primarily centered on the left side of her abdomen in particular her left lower quadrant. This is been associated with significant nausea which is preventing her from eating. She also has had several episodes of emesis. She denies any fevers. She denies any urinary symptoms. She has not passed flatus or had a bowel movement in the last 3 days either. She has a prior history of a  colonic resection for what sounds to be possibly ischemia done in [facility]. She had a left lower quadrant colostomy at that time. Subsequent to that she underwent reversal of her colostomy. This eventually was complicated by a hernia that was repaired in [facility] according to the patient with a laparotomy and a mesh repair of this hernia. She also has had a cholecystectomy and endometrial ablation. He was made his pain better at home. It is exacerbated by movement or palpation. Is also exacerbated by eating. Past Medical History  Diagnosis Date  . Diabetes mellitus   . Back pain   . Thyroid disease   . Rosacea     Past Surgical History  Procedure Date  . Cholecystectomy   . Endometrial ablation   . Back surgery   . Colon surgery   . Colostomy   . Revision colostomy     History reviewed. No pertinent family history. Social History:  reports that she has never smoked. She does not have any smokeless tobacco history on file. She reports that she does not drink alcohol or use illicit drugs.  Allergies:  Allergies  Allergen Reactions  . Chocolate Other (See Comments)    rosacea   . Sulfa Antibiotics Nausea Only    No current facility-administered medications on file prior to encounter.   Current Outpatient Prescriptions on File Prior to Encounter  Medication Sig  Dispense Refill  . aspirin 81 MG EC tablet Take 81 mg by mouth every evening.       Marland Kitchen atorvastatin (LIPITOR) 10 MG tablet Take 10 mg by mouth at bedtime.       . Canagliflozin (INVOKANA) 300 MG TABS Take 300 mg by mouth every morning. Before breakfast      . Cholecalciferol 5000 UNITS capsule Take 5,000 Units by mouth every morning. Vitamin D      . doxycycline (VIBRA-TABS) 100 MG tablet Take 100 mg by mouth 2 (two) times daily as needed. For rosacea       . enalapril (VASOTEC) 5 MG tablet Take 5 mg by mouth at bedtime.        Marland Kitchen HYDROcodone-acetaminophen (NORCO/VICODIN) 5-325 MG per tablet Take 1 tablet by mouth every 6 (six) hours as needed for pain.  20 tablet  0  . levothyroxine (SYNTHROID, LEVOTHROID) 175 MCG tablet Take 175 mcg by mouth every morning.       . metFORMIN (GLUCOPHAGE) 1000 MG tablet Take 1,000 mg by mouth 2 (two) times daily.        . methocarbamol (ROBAXIN) 500 MG tablet Take 500 mg by mouth every 8 (eight) hours as needed. For muscle spasms      . pantoprazole (PROTONIX) 20 MG tablet Take 2 tablets (40 mg total) by mouth daily.  30 tablet  0  . promethazine (PHENERGAN) 25 MG tablet Take 1  tablet (25 mg total) by mouth every 6 (six) hours as needed for nausea.  15 tablet  0  . acetaminophen (TYLENOL) 500 MG tablet Take 1,000 mg by mouth every 6 (six) hours as needed. For pain       . insulin glargine (LANTUS SOLOSTAR) 100 UNIT/ML injection Inject 60 Units into the skin 2 (two) times daily.        . Liraglutide (VICTOZA) 18 MG/3ML SOLN Inject 1.8 mg into the skin at bedtime.       . metroNIDAZOLE (METROCREAM) 0.75 % cream Apply 1 application topically 2 (two) times daily as needed. For rosacea         Results for orders placed during the hospital encounter of 08/02/12 (from the past 48 hour(s))  CBC WITH DIFFERENTIAL     Status: Abnormal   Collection Time   08/02/12 12:56 PM      Component Value Range Comment   WBC 15.4 (*) 4.0 - 10.5 K/uL    RBC 5.60 (*) 3.87 - 5.11  MIL/uL    Hemoglobin 17.2 (*) 12.0 - 15.0 g/dL    HCT 16.1 (*) 09.6 - 46.0 %    MCV 88.6  78.0 - 100.0 fL    MCH 30.7  26.0 - 34.0 pg    MCHC 34.7  30.0 - 36.0 g/dL    RDW 04.5  40.9 - 81.1 %    Platelets 357  150 - 400 K/uL    Neutrophils Relative 73  43 - 77 %    Neutro Abs 11.3 (*) 1.7 - 7.7 K/uL    Lymphocytes Relative 19  12 - 46 %    Lymphs Abs 3.0  0.7 - 4.0 K/uL    Monocytes Relative 7  3 - 12 %    Monocytes Absolute 1.1 (*) 0.1 - 1.0 K/uL    Eosinophils Relative 0  0 - 5 %    Eosinophils Absolute 0.0  0.0 - 0.7 K/uL    Basophils Relative 0  0 - 1 %    Basophils Absolute 0.0  0.0 - 0.1 K/uL   COMPREHENSIVE METABOLIC PANEL     Status: Abnormal   Collection Time   08/02/12 12:56 PM      Component Value Range Comment   Sodium 131 (*) 135 - 145 mEq/L    Potassium 4.3  3.5 - 5.1 mEq/L    Chloride 95 (*) 96 - 112 mEq/L    CO2 17 (*) 19 - 32 mEq/L    Glucose, Bld 139 (*) 70 - 99 mg/dL    BUN 15  6 - 23 mg/dL    Creatinine, Ser 9.14  0.50 - 1.10 mg/dL    Calcium 78.2  8.4 - 10.5 mg/dL    Total Protein 8.6 (*) 6.0 - 8.3 g/dL    Albumin 4.3  3.5 - 5.2 g/dL    AST 12  0 - 37 U/L    ALT 15  0 - 35 U/L    Alkaline Phosphatase 74  39 - 117 U/L    Total Bilirubin 1.4 (*) 0.3 - 1.2 mg/dL    GFR calc non Af Amer >90  >90 mL/min    GFR calc Af Amer >90  >90 mL/min   LIPASE, BLOOD     Status: Normal   Collection Time   08/02/12 12:56 PM      Component Value Range Comment   Lipase 51  11 - 59 U/L   AMYLASE     Status:  Normal   Collection Time   08/02/12 12:56 PM      Component Value Range Comment   Amylase 100  0 - 105 U/L   URINALYSIS, ROUTINE W REFLEX MICROSCOPIC     Status: Abnormal   Collection Time   08/02/12  6:00 PM      Component Value Range Comment   Color, Urine YELLOW  YELLOW    APPearance CLEAR  CLEAR    Specific Gravity, Urine >1.030 (*) 1.005 - 1.030    pH 5.0  5.0 - 8.0    Glucose, UA >1000 (*) NEGATIVE mg/dL    Hgb urine dipstick MODERATE (*) NEGATIVE     Bilirubin Urine MODERATE (*) NEGATIVE    Ketones, ur >80 (*) NEGATIVE mg/dL    Protein, ur TRACE (*) NEGATIVE mg/dL    Urobilinogen, UA 0.2  0.0 - 1.0 mg/dL    Nitrite NEGATIVE  NEGATIVE    Leukocytes, UA NEGATIVE  NEGATIVE   URINE MICROSCOPIC-ADD ON     Status: Abnormal   Collection Time   08/02/12  6:00 PM      Component Value Range Comment   Squamous Epithelial / LPF FEW (*) RARE    WBC, UA 0-2  <3 WBC/hpf    RBC / HPF 7-10  <3 RBC/hpf    Bacteria, UA RARE  RARE   GLUCOSE, CAPILLARY     Status: Abnormal   Collection Time   08/02/12  9:29 PM      Component Value Range Comment   Glucose-Capillary 133 (*) 70 - 99 mg/dL   CG4 I-STAT (LACTIC ACID)     Status: Normal   Collection Time   08/03/12 12:05 AM      Component Value Range Comment   Lactic Acid, Venous 1.45  0.5 - 2.2 mmol/L   SALICYLATE LEVEL     Status: Abnormal   Collection Time   08/03/12  1:46 AM      Component Value Range Comment   Salicylate Lvl <2.0 (*) 2.8 - 20.0 mg/dL    Ct Abdomen Pelvis W Contrast  08/03/2012  *RADIOLOGY REPORT*  Clinical Data: Abdominal pain since Wednesday.  Nausea and vomiting.  Left lower quadrant pain.  CT ABDOMEN AND PELVIS WITH CONTRAST  Technique:  Multidetector CT imaging of the abdomen and pelvis was performed following the standard protocol during bolus administration of intravenous contrast.  Contrast: OMNIPAQUE IOHEXOL 300 MG/ML  SOLN  Comparison: 02/17/2009  Findings: The lung bases are clear.  Focal low attenuation change in the liver adjacent to the falciform ligament suggesting focal fatty infiltration.  Sub centimeter low attenuation lesions in the kidney likely representing small cysts or hemangiomas.  Similar changes are present on previous study. Surgical absence of the gallbladder.  16 mm adrenal gland nodule on the left is stable and likely represents an adenoma.  Right adrenal gland is normal.  The spleen, pancreas, abdominal aorta, and retroperitoneal lymph nodes are  unremarkable.  Sub centimeter parenchymal cysts in the kidneys.  No solid mass or hydronephrosis appreciated.  The stomach is decompressed.  There is diffuse infiltration in the anterior abdominal fat extending into the panniculus consistent with scarring and fat necrosis.  There is a right lower quadrant anterior abdominal wall hernia containing small bowel.  The small bowel proximal to the hernia is distended and the distal small bowel is decompressed consistent with small bowel obstruction. The colon is decompressed. No free air or free fluid in the abdomen.  Pelvis:  The  bladder wall is not thickened.  Uterus is not enlarged.  Mild enlargement of the ovaries, greater on the left suggesting a large cyst measuring about 5 cm.  No free pelvic fluid collections.  The appendix is normal.  No evidence of diverticulitis.  Degenerative changes and postoperative changes in the lumbar spine.  IMPRESSION: There is a low right lower quadrant abdominal wall hernia, likely representing postoperative hernia.  The hernia contains small bowel with proximal  obstruction.  There is diffuse infiltrative change in the lower anterior abdominal wall extending into the panniculitis which could represent postoperative scarring versus fat necrosis.   Original Report Authenticated By: Burman Nieves, M.D.    US Abdomen Limited Ruq  08/02/2012  *RADIOLOGY REPORT*  Clinical Data:  Right-sided abdominal pain.  Prior cholecystectomy.  LIMITED ABDOMINAL ULTRASOUND - RIGHT UPPER QUADRANT  Comparison:  None.  Findings:  Gallbladder:  Surgically absent  Common bile duct:  Measures 7 mm in diameter, which is within normal limits status post cholecystectomy.  Liver:  Within normal limits in parenchymal echogenicity.  No mass identified.  IMPRESSION: Prior cholecystectomy.  No evidence of biliary dilatation.                    Original Report Authenticated By: Myles Rosenthal, M.D.     Review of Systems  Constitutional: Positive for  malaise/fatigue. Negative for fever and chills.  Respiratory: Negative for cough and shortness of breath.   Cardiovascular: Negative for chest pain.  Gastrointestinal: Positive for nausea, vomiting and abdominal pain. Negative for diarrhea and constipation.  Genitourinary: Negative for dysuria and urgency.  Neurological: Positive for weakness.    Blood pressure 128/75, pulse 102, temperature 97.8 F (36.6 C), temperature source Oral, resp. rate 18, height 5\' 6"  (1.676 m), weight 222 lb (100.699 kg), SpO2 95.00%. Physical Exam  Vitals reviewed. Constitutional: She appears well-developed and well-nourished. No distress.  Eyes: No scleral icterus.  Cardiovascular: Normal rate, regular rhythm and normal heart sounds.   Respiratory: Effort normal and breath sounds normal. No respiratory distress. She has no wheezes. She has no rales.  GI: She exhibits distension (mild but difficult to tell due to obesity). Bowel sounds are decreased. There is tenderness in the left upper quadrant and left lower quadrant. A hernia is present. Hernia confirmed positive in the ventral area (llq, only able to tell this due to ct scan that is present and this is location of her pain in llq).  Lymphadenopathy:    She has no cervical adenopathy.     Assessment/Plan Incarcerated hernia with SBO  I discussed with she and her family that she will need surgery. We discussed exploratory laparotomy with reduction of the hernia. I told her this may involve what appears to be a small bowel resection. This likely would be followed by primary anastomosis. The colon is involved then I would possibly be a resection and possibly ostomy but I don't think this will be the case given her CT scan. We discussed in repair of her hernia which would be secondary to relieving the obstruction. She understands well from her prior procedure that this would need to be done with mesh especially given her prior surgeries and her body habitus. There  is a chance due to the fact that she has an obstruction that we may not be able to use permanent mesh. This would make her recurrence rate significantly higher. I also discussed with her she may have an open wound postoperatively due to risk of  infection also. We discussed her hospital stay which I think will be a week to 10 days postoperatively. To the lumbar porters would do this today. We also discussed the risk including bleeding, infection, recurrence, ileus, long-term nasogastric tube drainage among others. She and her family were present for this consultation and understand.  Marieclaire Bettenhausen 08/03/2012, 5:30 AM

## 2012-08-03 NOTE — ED Notes (Signed)
Dr Wyatt in to see patient. 

## 2012-08-03 NOTE — Anesthesia Postprocedure Evaluation (Signed)
Anesthesia Post Note  Patient: Nancy [name]  Procedure(s) Performed: Procedure(s) (LRB): HERNIA REPAIR VENTRAL ADULT (N/A) EXPLORATORY LAPAROTOMY (N/A) INSERTION OF MESH (N/A)  Anesthesia type: general  Patient location: PACU  Post pain: Pain level controlled  Post assessment: Patient's Cardiovascular Status Stable  Last Vitals:  Filed Vitals:   08/03/12 1530  BP: 140/74  Pulse: 106  Temp: 37.4 C  Resp:     Post vital signs: Reviewed and stable  Level of consciousness: sedated  Complications: No apparent anesthesia complications

## 2012-08-03 NOTE — ED Notes (Signed)
Dr. Hyacinth Meeker will come to talk with patient

## 2012-08-03 NOTE — Progress Notes (Signed)
UR completed 

## 2012-08-03 NOTE — ED Notes (Signed)
NG placement immediate return of greenish fluid.  Placement checked by this nurse

## 2012-08-03 NOTE — Preoperative (Signed)
Beta Blockers   Reason not to administer Beta Blockers:Not Applicable 

## 2012-08-04 DIAGNOSIS — R000 Tachycardia, unspecified: Secondary | ICD-10-CM

## 2012-08-04 DIAGNOSIS — E119 Type 2 diabetes mellitus without complications: Secondary | ICD-10-CM

## 2012-08-04 LAB — BASIC METABOLIC PANEL
CO2: 8 mEq/L — CL (ref 19–32)
Calcium: 8.5 mg/dL (ref 8.4–10.5)
Chloride: 108 mEq/L (ref 96–112)
Potassium: 4.6 mEq/L (ref 3.5–5.1)
Sodium: 141 mEq/L (ref 135–145)

## 2012-08-04 LAB — CBC
MCH: 30.1 pg (ref 26.0–34.0)
Platelets: 321 10*3/uL (ref 150–400)
RBC: 4.98 MIL/uL (ref 3.87–5.11)
WBC: 21.3 10*3/uL — ABNORMAL HIGH (ref 4.0–10.5)

## 2012-08-04 LAB — GLUCOSE, CAPILLARY

## 2012-08-04 LAB — HEMOGLOBIN A1C
Hgb A1c MFr Bld: 6.5 % — ABNORMAL HIGH (ref <5.7)
Mean Plasma Glucose: 140 mg/dL — ABNORMAL HIGH (ref <117)

## 2012-08-04 MED ORDER — METOPROLOL TARTRATE 1 MG/ML IV SOLN
5.0000 mg | Freq: Four times a day (QID) | INTRAVENOUS | Status: DC
  Administered 2012-08-04 – 2012-08-10 (×23): 5 mg via INTRAVENOUS
  Filled 2012-08-04 (×30): qty 5

## 2012-08-04 MED ORDER — INSULIN GLARGINE 100 UNIT/ML ~~LOC~~ SOLN
20.0000 [IU] | Freq: Two times a day (BID) | SUBCUTANEOUS | Status: DC
  Administered 2012-08-04 – 2012-08-05 (×4): 20 [IU] via SUBCUTANEOUS
  Administered 2012-08-06: 10:00:00 via SUBCUTANEOUS
  Administered 2012-08-06 – 2012-08-10 (×8): 20 [IU] via SUBCUTANEOUS

## 2012-08-04 NOTE — Progress Notes (Signed)
1 Day Post-Op  Subjective: Stable and alert. Heart rate 110. VSS.  Urine output excellent. No flatus or stool.NG drainage high but  eating lots of ice, Looks dilute. JP drain functioning, low-volume, serosanguineous. On sliding scale insulin, glucose 2 11/20/2009 to 241. Takes Lantus twice a day at home followed by Dr. Leslie Dales.  Objective: Vital signs in last 24 hours: Temp:  [97.7 F (36.5 C)-99.3 F (37.4 C)] 97.7 F (36.5 C) (12/15 0558) Pulse Rate:  [98-124] 116  (12/15 0558) Resp:  [14-24] 16  (12/15 0558) BP: (95-144)/(54-94) 95/69 mmHg (12/15 0558) SpO2:  [94 %-100 %] 99 % (12/15 0558) Weight:  [222 lb (100.699 kg)] 222 lb (100.699 kg) (12/14 0807)    Intake/Output from previous day: 12/14 0701 - 12/15 0700 In: 4350 [I.V.:4300] Out: 6360 [Urine:3675; Emesis/NG output:2550; Drains:60; Blood:75] Intake/Output this shift:    General appearance: alert.  cooperative. Mental status normal. Minimal distress. Resp: clear to auscultation bilaterally GI: obese. Soft. Silent. . Wound okay, dressing dry and intact. JP functioning, serosanguineous.  Lab Results:  Results for orders placed during the hospital encounter of 08/02/12 (from the past 24 hour(s))  SURGICAL PCR SCREEN     Status: Normal   Collection Time   08/03/12  8:48 AM      Component Value Range   MRSA, PCR NEGATIVE  NEGATIVE   Staphylococcus aureus NEGATIVE  NEGATIVE  GLUCOSE, CAPILLARY     Status: Abnormal   Collection Time   08/03/12  3:37 PM      Component Value Range   Glucose-Capillary 153 (*) 70 - 99 mg/dL   Comment 1 Notify RN     Comment 2 Documented in Chart    GLUCOSE, CAPILLARY     Status: Abnormal   Collection Time   08/03/12  8:13 PM      Component Value Range   Glucose-Capillary 173 (*) 70 - 99 mg/dL  GLUCOSE, CAPILLARY     Status: Abnormal   Collection Time   08/03/12 11:56 PM      Component Value Range   Glucose-Capillary 242 (*) 70 - 99 mg/dL  GLUCOSE, CAPILLARY     Status: Abnormal   Collection Time   08/04/12  4:04 AM      Component Value Range   Glucose-Capillary 211 (*) 70 - 99 mg/dL     Studies/Results: @RISRSLT24 @     . sodium chloride   Intravenous Once  . enoxaparin (LOVENOX) injection  40 mg Subcutaneous Q24H  . HYDROmorphone PCA 0.3 mg/mL   Intravenous Q4H  . insulin aspart  0-15 Units Subcutaneous TID WC  . insulin aspart  0-9 Units Subcutaneous Q4H  . pantoprazole (PROTONIX) IV  40 mg Intravenous QHS  . pneumococcal 23 valent vaccine  0.5 mL Intramuscular Tomorrow-1000     Assessment/Plan: s/p Procedure(s): HERNIA REPAIR VENTRAL ADULT EXPLORATORY LAPAROTOMY INSERTION OF MESH  POD #1, open repair multiple ventral hernias, lysis of adhesions to release SBO,Inlay Synthetic mesh. Stable. Expected ileus. Continue NG. Mobilize and ambulate DC Foley tomorrow  Insulin-dependent diabetes mellitus. Needs better control. Will restart twice a day Lantus insulin, but at lower dose.  Past history cholecystectomy, colon surgery, colostomy, colostomy revision, endometrial ablation.  Tachycardia. She seems euvolemic, so will start prophylactic Lopressor 5 mg every 6 hours.  Patient Active Hospital Problem List: No active hospital problems.   LOS: 2 days    Nancy [name] M. Derrell Lolling, M.D., [facility] General and Minimally invasive Surgery Breast and Colorectal Surgery Office:  [phone] Pager:   [phone]  08/04/2012  . .prob

## 2012-08-05 ENCOUNTER — Encounter (HOSPITAL_COMMUNITY): Payer: Self-pay | Admitting: General Surgery

## 2012-08-05 LAB — BASIC METABOLIC PANEL
BUN: 27 mg/dL — ABNORMAL HIGH (ref 6–23)
CO2: 9 mEq/L — CL (ref 19–32)
Chloride: 119 mEq/L — ABNORMAL HIGH (ref 96–112)
Glucose, Bld: 183 mg/dL — ABNORMAL HIGH (ref 70–99)
Potassium: 4.2 mEq/L (ref 3.5–5.1)
Sodium: 150 mEq/L — ABNORMAL HIGH (ref 135–145)

## 2012-08-05 LAB — GLUCOSE, CAPILLARY
Glucose-Capillary: 183 mg/dL — ABNORMAL HIGH (ref 70–99)
Glucose-Capillary: 188 mg/dL — ABNORMAL HIGH (ref 70–99)
Glucose-Capillary: 181 mg/dL — ABNORMAL HIGH (ref 70–99)
Glucose-Capillary: 173 mg/dL — ABNORMAL HIGH (ref 70–99)
Glucose-Capillary: 198 mg/dL — ABNORMAL HIGH (ref 70–99)

## 2012-08-05 LAB — CBC
HCT: 43.8 % (ref 36.0–46.0)
Hemoglobin: 14.4 g/dL (ref 12.0–15.0)
RBC: 4.78 MIL/uL (ref 3.87–5.11)
WBC: 13.4 10*3/uL — ABNORMAL HIGH (ref 4.0–10.5)

## 2012-08-05 MED ORDER — POTASSIUM CL IN DEXTROSE 5% 20 MEQ/L IV SOLN
20.0000 meq | INTRAVENOUS | Status: DC
  Administered 2012-08-05 – 2012-08-07 (×6): 20 meq via INTRAVENOUS
  Filled 2012-08-05 (×10): qty 1000

## 2012-08-05 MED ORDER — NALOXONE HCL 0.4 MG/ML IJ SOLN
0.2000 mg | Freq: Once | INTRAMUSCULAR | Status: AC
  Administered 2012-08-05: 0.2 mg via INTRAVENOUS

## 2012-08-05 MED ORDER — ACETAMINOPHEN 10 MG/ML IV SOLN
1000.0000 mg | Freq: Four times a day (QID) | INTRAVENOUS | Status: AC
  Administered 2012-08-05 – 2012-08-06 (×4): 1000 mg via INTRAVENOUS
  Filled 2012-08-05 (×5): qty 100

## 2012-08-05 MED ORDER — TRAMADOL 5 MG/ML ORAL SUSPENSION: 100.0000 mg | Freq: Four times a day (QID) | ORAL | Status: DC | PRN

## 2012-08-05 MED ORDER — DIPHENHYDRAMINE HCL 50 MG/ML IJ SOLN
INTRAMUSCULAR | Status: AC
  Filled 2012-08-05: qty 1

## 2012-08-05 MED ORDER — CHLORHEXIDINE GLUCONATE 0.12 % MT SOLN
15.0000 mL | Freq: Two times a day (BID) | OROMUCOSAL | Status: DC
  Administered 2012-08-05 – 2012-08-07 (×5): 15 mL via OROMUCOSAL
  Filled 2012-08-05 (×3): qty 15

## 2012-08-05 MED ORDER — TRAMADOL HCL 50 MG PO TABS
100.0000 mg | ORAL_TABLET | Freq: Four times a day (QID) | ORAL | Status: DC | PRN
  Administered 2012-08-06: 100 mg
  Filled 2012-08-05: qty 2

## 2012-08-05 MED ORDER — DIPHENHYDRAMINE HCL 50 MG/ML IJ SOLN
12.5000 mg | Freq: Once | INTRAMUSCULAR | Status: AC
  Administered 2012-08-05: 12.5 mg via INTRAVENOUS

## 2012-08-05 MED ORDER — BIOTENE DRY MOUTH MT LIQD
15.0000 mL | Freq: Two times a day (BID) | OROMUCOSAL | Status: DC
  Administered 2012-08-06 – 2012-08-07 (×4): 15 mL via OROMUCOSAL

## 2012-08-05 MED ORDER — DIPHENHYDRAMINE HCL 50 MG/ML IJ SOLN
12.5000 mg | Freq: Every evening | INTRAMUSCULAR | Status: DC | PRN
  Administered 2012-08-05: 12.5 mg via INTRAVENOUS
  Filled 2012-08-05: qty 1

## 2012-08-05 MED FILL — Povidone-Iodine Oint 10%: CUTANEOUS | Qty: 28.35 | Status: AC

## 2012-08-05 NOTE — Progress Notes (Signed)
2 Days Post-Op  Subjective: Patient appears obtunded this am, slow speech, but is able to respond to questions. Had been on full strength PCA (has been dc'd) Will give Narcan as well. ? Secondary to hypernatremia Will chng IVF,  BGS is 183.  Objective: Vital signs in last 24 hours: Temp:  [97.2 F (36.2 C)-100 F (37.8 C)] 100 F (37.8 C) (12/16 1007) Pulse Rate:  [100-127] 127  (12/16 1007) Resp:  [12-24] 24  (12/16 1007) BP: (94-120)/(53-65) 113/53 mmHg (12/16 1007) SpO2:  [94 %-100 %] 94 % (12/16 1007) FiO2 (%):  [94 %-98 %] 94 % (12/16 0747)    Intake/Output from previous day: 12/15 0701 - 12/16 0700 In: 1200 [I.V.:1200] Out: 6450 [Urine:4350; Emesis/NG output:2000; Drains:100] Intake/Output this shift:    General appearance: cooperative, appears stated age, mild distress, morbidly obese and slowed mentation Chest: CTA Cardiac: RRR Abdomen: obese, abdominal binder in place, dressing C/D/I JP drain (SS drng past 24 hrs) Extremiteis: no edema or tenderness, + pulses. Labs: WBC are trending down, Hypernatremic (changed IVF ) BGS 183. Lab Results:   Basename 08/05/12 0734 08/04/12 0730  WBC 13.4* 21.3*  HGB 14.4 15.0  HCT 43.8 46.0  PLT 265 321   BMET  Basename 08/05/12 0734 08/04/12 0730  NA 150* 141  K 4.2 4.6  CL 119* 108  CO2 9* 8*  GLUCOSE 183* 193*  BUN 27* 24*  CREATININE 0.73 0.83  CALCIUM 8.9 8.5   PT/INR No results found for this basename: LABPROT:2,INR:2 in the last 72 hours ABG No results found for this basename: PHART:2,PCO2:2,PO2:2,HCO3:2 in the last 72 hours  Studies/Results: No results found.  Anti-infectives: Anti-infectives     Start     Dose/Rate Route Frequency Ordered Stop   08/03/12 2145   ciprofloxacin (CIPRO) IVPB 400 mg        400 mg 200 mL/hr over 60 Minutes Intravenous Every 12 hours 08/03/12 2134 08/03/12 2343   08/03/12 1310   polymyxin B 500,000 Units, bacitracin 50,000 Units in sodium chloride irrigation 0.9 %  500 mL irrigation  Status:  Discontinued          As needed 08/03/12 1310 08/03/12 1525   08/03/12 0830   cefOXitin (MEFOXIN) 2 g in dextrose 5 % 50 mL IVPB        2 g 100 mL/hr over 30 Minutes Intravenous On call to O.R. 08/03/12 0744 08/03/12 1114   08/03/12 0745   ceFAZolin (ANCEF) IVPB 2 g/50 mL premix  Status:  Discontinued     Comments: Give on call to OR      2 g 100 mL/hr over 30 Minutes Intravenous  Once 08/03/12 0744 08/03/12 0750   08/02/12 1430   ciprofloxacin (CIPRO) IVPB 400 mg        400 mg 200 mL/hr over 60 Minutes Intravenous  Once 08/02/12 1421 08/02/12 1553   08/02/12 1430   metroNIDAZOLE (FLAGYL) IVPB 500 mg        500 mg 100 mL/hr over 60 Minutes Intravenous  Once 08/02/12 1421 08/02/12 1741          Assessment/Plan: s/p Procedure(s) (LRB) with comments: HERNIA REPAIR VENTRAL ADULT (N/A) EXPLORATORY LAPAROTOMY (N/A) - with extensive enterolysis INSERTION OF MESH (N/A)  Patient Active Problem List  Diagnosis  . DM   1. Will dc PCA (hold further narcotics for now) 2. Give Narcan now (continue to monitor mental status) 3. Change IVF to D5w wth 20 meq K+ 4. Recheck labs in  am 5. Follow clinical presentation   LOS: 3 days    Golda Acre [facility] Pager # [phone] 08/05/2012

## 2012-08-05 NOTE — Progress Notes (Signed)
Patient interviewed and examined, agree with PA note above. Patient is now much more alert as on seeing her but does have incisional pain. Her abdomen seems benign and dressing is dry and intact. Additional meds have been ordered. Pain control was difficult but I do not see evidence of complications at this point. Mariella Saa MD, FACS  08/05/2012 7:29 PM

## 2012-08-06 LAB — GLUCOSE, CAPILLARY
Glucose-Capillary: 193 mg/dL — ABNORMAL HIGH (ref 70–99)
Glucose-Capillary: 169 mg/dL — ABNORMAL HIGH (ref 70–99)
Glucose-Capillary: 163 mg/dL — ABNORMAL HIGH (ref 70–99)
Glucose-Capillary: 145 mg/dL — ABNORMAL HIGH (ref 70–99)

## 2012-08-06 LAB — BASIC METABOLIC PANEL
GFR calc Af Amer: >90 mL/min (ref >90)
GFR calc non Af Amer: >90 mL/min (ref >90)
Glucose, Bld: 206 mg/dL — ABNORMAL HIGH (ref 70–99)
Potassium: 2.8 mEq/L — ABNORMAL LOW (ref 3.5–5.1)
Sodium: 155 mEq/L — ABNORMAL HIGH (ref 135–145)

## 2012-08-06 MED ORDER — FENTANYL CITRATE 0.05 MG/ML IJ SOLN
25.0000 ug | INTRAMUSCULAR | Status: DC | PRN
  Administered 2012-08-06 (×3): 25 ug via INTRAVENOUS
  Administered 2012-08-06 (×2): 50 ug via INTRAVENOUS
  Administered 2012-08-07 (×3): 25 ug via INTRAVENOUS
  Filled 2012-08-06 (×6): qty 2

## 2012-08-06 MED ORDER — ONDANSETRON HCL 4 MG/2ML IJ SOLN
4.0000 mg | Freq: Four times a day (QID) | INTRAMUSCULAR | Status: DC | PRN
  Administered 2012-08-06 (×2): 4 mg via INTRAVENOUS
  Filled 2012-08-06 (×3): qty 2

## 2012-08-06 NOTE — Progress Notes (Signed)
Patient interviewed and examined, agree with NP note above. She has had very minimal NG output and is passing gas and I have discontinued her NG tube. She is much more comfortable today. Mariella Saa MD, FACS  08/06/2012 7:07 PM

## 2012-08-06 NOTE — Progress Notes (Signed)
0110 Patient c/o pain unrelieved by IV tylenol and ultram. 0114 Dr. Donell Beers notified of pain issue, orders received. 0126 fentanyl given IV and zofran 4mg  IV . 0200 Patient resting. Will continue to monitor.

## 2012-08-06 NOTE — Progress Notes (Addendum)
Patient ID: Nancy [name], female   DOB: [birth year], 46 y.o.   MRN: [id number] 3 Days Post-Op  Subjective: Patient appears much more comfortable this morning. NAD.  Objective: Vital signs in last 24 hours: Temp:  [97.7 F (36.5 C)-98.6 F (37 C)] 97.7 F (36.5 C) (12/17 0524) Pulse Rate:  [97-123] 97  (12/17 0524) Resp:  [18-24] 18  (12/17 0524) BP: (112-127)/(57-69) 112/68 mmHg (12/17 0524) SpO2:  [97 %-99 %] 97 % (12/17 0524) Last BM Date: 08/02/12  Intake/Output from previous day: 12/16 0701 - 12/17 0700 In: 2710.4 [I.V.:2310.4; IV Piggyback:400] Out: 2470 [Urine:2200; Emesis/NG output:200; Drains:70] Intake/Output this shift:    General appearance: NAD, more alert this am. No c/o of abdominal pain states that her pain is better today. Chest: CTA Cardiac: RRR Abdomen: obese, abdominal binder in place, dressing C/D/I, NG in place,9253ml past 24 hr) + flatus per patient. JP drain (SS drng 70ml past 24 hrs) Extremiteis: no edema or tenderness, + pulses. Labs: WBC are trending down, Hypernatremic (changed IVF ) BGS 183. Lab Results:   Basename 08/05/12 0734 08/04/12 0730  WBC 13.4* 21.3*  HGB 14.4 15.0  HCT 43.8 46.0  PLT 265 321   BMET  Basename 08/05/12 0734 08/04/12 0730  NA 150* 141  K 4.2 4.6  CL 119* 108  CO2 9* 8*  GLUCOSE 183* 193*  BUN 27* 24*  CREATININE 0.73 0.83  CALCIUM 8.9 8.5   PT/INR No results found for this basename: LABPROT:2,INR:2 in the last 72 hours ABG No results found for this basename: PHART:2,PCO2:2,PO2:2,HCO3:2 in the last 72 hours  Studies/Results: No results found.  Anti-infectives: Anti-infectives     Start     Dose/Rate Route Frequency Ordered Stop   08/03/12 2145   ciprofloxacin (CIPRO) IVPB 400 mg        400 mg 200 mL/hr over 60 Minutes Intravenous Every 12 hours 08/03/12 2134 08/03/12 2343   08/03/12 1310   polymyxin B 500,000 Units, bacitracin 50,000 Units in sodium chloride irrigation 0.9 % 500 mL irrigation  Status:   Discontinued          As needed 08/03/12 1310 08/03/12 1525   08/03/12 0830   cefOXitin (MEFOXIN) 2 g in dextrose 5 % 50 mL IVPB        2 g 100 mL/hr over 30 Minutes Intravenous On call to O.R. 08/03/12 0744 08/03/12 1114   08/03/12 0745   ceFAZolin (ANCEF) IVPB 2 g/50 mL premix  Status:  Discontinued     Comments: Give on call to OR      2 g 100 mL/hr over 30 Minutes Intravenous  Once 08/03/12 0744 08/03/12 0750   08/02/12 1430   ciprofloxacin (CIPRO) IVPB 400 mg        400 mg 200 mL/hr over 60 Minutes Intravenous  Once 08/02/12 1421 08/02/12 1553   08/02/12 1430   metroNIDAZOLE (FLAGYL) IVPB 500 mg        500 mg 100 mL/hr over 60 Minutes Intravenous  Once 08/02/12 1421 08/02/12 1741          Assessment/Plan: s/p Procedure(s) (LRB) with comments: HERNIA REPAIR VENTRAL ADULT (N/A) EXPLORATORY LAPAROTOMY (N/A) - with extensive enterolysis INSERTION OF MESH (N/A)  Patient Active Problem List  Diagnosis  . DM   1. Clamp NG tube 2. Continue IVF 3. OOB/ambulate 4. Follow clinical picture.   LOS: 4 days    Golda Acre [facility] Pager # [phone] 08/06/2012

## 2012-08-07 LAB — GLUCOSE, CAPILLARY: Glucose-Capillary: 157 mg/dL — ABNORMAL HIGH (ref 70–99)

## 2012-08-07 MED ORDER — POTASSIUM CL IN DEXTROSE 5% 20 MEQ/L IV SOLN
20.0000 meq | INTRAVENOUS | Status: DC
  Filled 2012-08-07 (×4): qty 1000

## 2012-08-07 MED ORDER — HYDROCODONE-ACETAMINOPHEN 5-325 MG PO TABS
1.0000 | ORAL_TABLET | ORAL | Status: DC | PRN
  Administered 2012-08-07 – 2012-08-10 (×8): 2 via ORAL
  Filled 2012-08-07 (×8): qty 2

## 2012-08-07 MED ORDER — PNEUMOCOCCAL VAC POLYVALENT 25 MCG/0.5ML IJ INJ
0.5000 mL | INJECTION | INTRAMUSCULAR | Status: AC
  Administered 2012-08-08: 0.5 mL via INTRAMUSCULAR
  Filled 2012-08-07: qty 0.5

## 2012-08-07 NOTE — Progress Notes (Signed)
Patient ID: Nancy [name], female   DOB: July 12, 1966, 46 y.o.   MRN: [id number] 4 Days Post-Op   Subjective: Patient appears much more comfortable this morning. NAD.  Objective: Vital signs in last 24 hours: Temp:  [97.7 F (36.5 C)-98.5 F (36.9 C)] 97.7 F (36.5 C) (12/18 1000) Pulse Rate:  [70-83] 70  (12/18 1000) Resp:  [16-20] 20  (12/18 1000) BP: (112-122)/(48-64) 112/64 mmHg (12/18 1000) SpO2:  [94 %-98 %] 97 % (12/18 1000) Last BM Date: 08/02/12  Intake/Output from previous day: 12/17 0701 - 12/18 0700 In: 30 [NG/GT:30] Out: 375 [Urine:350; Drains:25] Intake/Output this shift:    General appearance: NAD, more alert this am. No c/o of abdominal pain states that her pain is better today. Chest: CTA Cardiac: RRR Abdomen: obese, abdominal binder in place, dressing C/D/I,  + flatus per patient. JP drain (SS drng 70ml past 24 hrs) Extremiteis: no edema or tenderness, + pulses. Labs: WBC are trending down, Hypernatremic (changed IVF ) BGS 183. Lab Results:   [facility] 08/05/12 0734  WBC 13.4*  HGB 14.4  HCT 43.8  PLT 265   BMET  Basename 08/06/12 0920 08/05/12 0734  NA 155* 150*  K 2.8* 4.2  CL 124* 119*  CO2 12* 9*  GLUCOSE 206* 183*  BUN 21 27*  CREATININE 0.61 0.73  CALCIUM 9.0 8.9   PT/INR No results found for this basename: LABPROT:2,INR:2 in the last 72 hours ABG No results found for this basename: PHART:2,PCO2:2,PO2:2,HCO3:2 in the last 72 hours  Studies/Results: No results found.  Anti-infectives: Anti-infectives     Start     Dose/Rate Route Frequency Ordered Stop   08/03/12 2145   ciprofloxacin (CIPRO) IVPB 400 mg        400 mg 200 mL/hr over 60 Minutes Intravenous Every 12 hours 08/03/12 2134 08/03/12 2343   08/03/12 1310   polymyxin B 500,000 Units, bacitracin 50,000 Units in sodium chloride irrigation 0.9 % 500 mL irrigation  Status:  Discontinued          As needed 08/03/12 1310 08/03/12 1525   08/03/12 0830   cefOXitin (MEFOXIN) 2 g in  dextrose 5 % 50 mL IVPB        2 g 100 mL/hr over 30 Minutes Intravenous On call to O.R. 08/03/12 0744 08/03/12 1114   08/03/12 0745   ceFAZolin (ANCEF) IVPB 2 g/50 mL premix  Status:  Discontinued     Comments: Give on call to OR      2 g 100 mL/hr over 30 Minutes Intravenous  Once 08/03/12 0744 08/03/12 0750   08/02/12 1430   ciprofloxacin (CIPRO) IVPB 400 mg        400 mg 200 mL/hr over 60 Minutes Intravenous  Once 08/02/12 1421 08/02/12 1553   08/02/12 1430   metroNIDAZOLE (FLAGYL) IVPB 500 mg        500 mg 100 mL/hr over 60 Minutes Intravenous  Once 08/02/12 1421 08/02/12 1741          Assessment/Plan: s/p Procedure(s) (LRB) with comments: HERNIA REPAIR VENTRAL ADULT (N/A) EXPLORATORY LAPAROTOMY (N/A) - with extensive enterolysis INSERTION OF MESH (N/A)  Patient Active Problem List  Diagnosis  . DM  1. Will start trial of clears, transition to po pain meds. 2. OOB/ambulate 3. Follow clinical picture.   LOS: 5 days    Golda Acre [facility] Pager # [phone] 08/07/2012

## 2012-08-07 NOTE — Progress Notes (Signed)
Patient interviewed and examined, agree with NP note above.  Mariella Saa MD, FACS  08/07/2012 5:41 PM

## 2012-08-08 LAB — GLUCOSE, CAPILLARY
Glucose-Capillary: 159 mg/dL — ABNORMAL HIGH (ref 70–99)
Glucose-Capillary: 172 mg/dL — ABNORMAL HIGH (ref 70–99)
Glucose-Capillary: 173 mg/dL — ABNORMAL HIGH (ref 70–99)
Glucose-Capillary: 174 mg/dL — ABNORMAL HIGH (ref 70–99)
Glucose-Capillary: 180 mg/dL — ABNORMAL HIGH (ref 70–99)
Glucose-Capillary: 199 mg/dL — ABNORMAL HIGH (ref 70–99)

## 2012-08-08 LAB — CBC
HCT: 39.4 % (ref 36.0–46.0)
MCHC: 34.5 g/dL (ref 30.0–36.0)
MCV: 85.8 fL (ref 78.0–100.0)
Platelets: 180 10*3/uL (ref 150–400)
RDW: 14.2 % (ref 11.5–15.5)
WBC: 7.4 10*3/uL (ref 4.0–10.5)

## 2012-08-08 LAB — COMPREHENSIVE METABOLIC PANEL
AST: 31 U/L (ref 0–37)
Albumin: 2.8 g/dL — ABNORMAL LOW (ref 3.5–5.2)
BUN: 15 mg/dL (ref 6–23)
Chloride: 109 mEq/L (ref 96–112)
Creatinine, Ser: 0.43 mg/dL — ABNORMAL LOW (ref 0.50–1.10)
Potassium: 2.6 mEq/L — CL (ref 3.5–5.1)
Total Protein: 6.1 g/dL (ref 6.0–8.3)

## 2012-08-08 MED ORDER — LEVOTHYROXINE SODIUM 175 MCG PO TABS
175.0000 ug | ORAL_TABLET | Freq: Every day | ORAL | Status: DC
  Administered 2012-08-09 – 2012-08-10 (×2): 175 ug via ORAL
  Filled 2012-08-08 (×4): qty 1

## 2012-08-08 MED ORDER — METRONIDAZOLE 0.75 % EX GEL
1.0000 "application " | Freq: Two times a day (BID) | CUTANEOUS | Status: DC | PRN
  Filled 2012-08-08: qty 45

## 2012-08-08 MED ORDER — POTASSIUM CHLORIDE CRYS ER 20 MEQ PO TBCR
30.0000 meq | EXTENDED_RELEASE_TABLET | Freq: Three times a day (TID) | ORAL | Status: DC
  Administered 2012-08-08 – 2012-08-10 (×8): 30 meq via ORAL
  Filled 2012-08-08 (×12): qty 1

## 2012-08-08 MED ORDER — POTASSIUM CHLORIDE IN NACL 40-0.9 MEQ/L-% IV SOLN
INTRAVENOUS | Status: DC
  Administered 2012-08-08: 75 mL/h via INTRAVENOUS
  Administered 2012-08-09 – 2012-08-10 (×3): via INTRAVENOUS
  Filled 2012-08-08 (×6): qty 1000

## 2012-08-08 MED ORDER — ENALAPRIL MALEATE 5 MG PO TABS
5.0000 mg | ORAL_TABLET | Freq: Every day | ORAL | Status: DC
  Administered 2012-08-09: 5 mg via ORAL
  Filled 2012-08-08 (×2): qty 1

## 2012-08-08 NOTE — Progress Notes (Signed)
5 Days Post-Op  Subjective: Feeling better, tolerating clears, and minimal walking  Objective: Vital signs in last 24 hours: Temp:  [98 F (36.7 C)-98.3 F (36.8 C)] 98.1 F (36.7 C) (12/19 1324) Pulse Rate:  [54-78] 78  (12/19 1324) Resp:  [18-20] 20  (12/19 1324) BP: (116-136)/(69-80) 116/69 mmHg (12/19 1324) SpO2:  [99 %-100 %] 100 % (12/19 1324) Last BM Date: 08/08/12 Diet: clears, 1080 ml PO recorded, +BM, afebrile, VSS, last K+ 2.8  Intake/Output from previous day: 12/18 0701 - 12/19 0700 In: 2806 [P.O.:1080; I.V.:1726] Out: 50 [Drains:50] Intake/Output this shift: Total I/O In: 480 [P.O.:480] Out: -   General appearance: alert, cooperative and no distress GI: soft, non-tender; bowel sounds normal; no masses,  no organomegaly and still tender, incision looks good, drainage is old serous bloody drain from JP.  Lab Results:   [facility] 08/08/12 1251  WBC 7.4  HGB 13.6  HCT 39.4  PLT 180    BMET  Basename 08/08/12 1251 08/06/12 0920  NA 146* 155*  K 2.6* 2.8*  CL 109 124*  CO2 27 12*  GLUCOSE 191* 206*  BUN 15 21  CREATININE 0.43* 0.61  CALCIUM 8.6 9.0   PT/INR No results found for this basename: LABPROT:2,INR:2 in the last 72 hours   Lab 08/08/12 1251 08/02/12 1256  AST 31 12  ALT 27 15  ALKPHOS 66 74  BILITOT 0.7 1.4*  PROT 6.1 8.6*  ALBUMIN 2.8* 4.3     Lipase     Component Value Date/Time   LIPASE 51 08/02/2012 1256     Studies/Results: No results found.  Medications:    . enoxaparin (LOVENOX) injection  40 mg Subcutaneous Q24H  . insulin aspart  0-9 Units Subcutaneous Q4H  . insulin glargine  20 Units Subcutaneous BID  . metoprolol  5 mg Intravenous Q6H  . pantoprazole (PROTONIX) IV  40 mg Intravenous QHS  . potassium chloride  30 mEq Oral TID AC & HS    Assessment/Plan Incarcerated hernia with SBO, prior hx of LLQ colostomy and reversal. Small bowel obstruction with multiple fascial defects and extensive adhesions S/p  Ventral hernia repair with extensive lysis of adhesions, [name], MD, 08/03/2012. Diabetes mellitus  Back pain  Thyroid disease  Rosacea Body mass index is 35.8 Obtunded on 08/05/12, PCA held,  Plan:  Full liquids, check labs, mobilize, home soon. Will need to decide on drain. Mag 1.9 K+ 2.6  Will replace K+.        LOS: 6 days    Nancy [name] 08/08/2012

## 2012-08-08 NOTE — Progress Notes (Signed)
Patient interviewed and examined, agree with PA note above. I expect she will be able to go home tomorrow. Mariella Saa MD, FACS  08/08/2012 6:26 PM

## 2012-08-09 LAB — BASIC METABOLIC PANEL
Calcium: 8.5 mg/dL (ref 8.4–10.5)
GFR calc Af Amer: >90 mL/min (ref >90)
GFR calc non Af Amer: >90 mL/min (ref >90)
Potassium: 2.9 mEq/L — ABNORMAL LOW (ref 3.5–5.1)
Sodium: 149 mEq/L — ABNORMAL HIGH (ref 135–145)

## 2012-08-09 LAB — GLUCOSE, CAPILLARY
Glucose-Capillary: 117 mg/dL — ABNORMAL HIGH (ref 70–99)
Glucose-Capillary: 122 mg/dL — ABNORMAL HIGH (ref 70–99)
Glucose-Capillary: 224 mg/dL — ABNORMAL HIGH (ref 70–99)

## 2012-08-09 LAB — MAGNESIUM: Magnesium: 1.7 mg/dL (ref 1.5–2.5)

## 2012-08-09 MED ORDER — NYSTATIN 100000 UNIT/ML MT SUSP
5.0000 mL | Freq: Four times a day (QID) | OROMUCOSAL | Status: DC
  Administered 2012-08-09 – 2012-08-10 (×6): 500000 [IU] via ORAL
  Filled 2012-08-09 (×8): qty 5

## 2012-08-09 MED ORDER — INSULIN ASPART 100 UNIT/ML ~~LOC~~ SOLN
0.0000 [IU] | Freq: Three times a day (TID) | SUBCUTANEOUS | Status: DC
  Administered 2012-08-09 – 2012-08-10 (×3): 2 [IU] via SUBCUTANEOUS

## 2012-08-09 MED ORDER — INSULIN ASPART 100 UNIT/ML ~~LOC~~ SOLN
0.0000 [IU] | Freq: Every day | SUBCUTANEOUS | Status: DC
  Administered 2012-08-09: 2 [IU] via SUBCUTANEOUS

## 2012-08-09 MED ORDER — MAGIC MOUTHWASH W/LIDOCAINE
5.0000 mL | Freq: Three times a day (TID) | ORAL | Status: DC | PRN
  Administered 2012-08-09: 5 mL via ORAL
  Filled 2012-08-09: qty 5

## 2012-08-09 MED ORDER — PANTOPRAZOLE SODIUM 40 MG PO TBEC
40.0000 mg | DELAYED_RELEASE_TABLET | Freq: Every day | ORAL | Status: DC
  Administered 2012-08-09 – 2012-08-10 (×2): 40 mg via ORAL
  Filled 2012-08-09 (×2): qty 1

## 2012-08-09 NOTE — Progress Notes (Signed)
Patient interviewed and examined, agree with PA note above. Anticipate discharge tomorrow Mariella Saa MD, FACS  08/09/2012 2:59 PM

## 2012-08-09 NOTE — Progress Notes (Signed)
Patient ID: KIYANA VAZGUEZ, female   DOB: 17-May-1966, 46 y.o.   MRN: [id number] 6 Days Post-Op  Subjective: Doing very well this am, +bm and flatus, looking forward to discharge but worried about K+  Objective: Vital signs in last 24 hours: Temp:  [98 F (36.7 C)-98.5 F (36.9 C)] 98.5 F (36.9 C) (12/20 0622) Pulse Rate:  [68-78] 70  (12/20 0622) Resp:  [18-20] 18  (12/20 0622) BP: (107-116)/(50-69) 112/50 mmHg (12/20 0622) SpO2:  [98 %-100 %] 98 % (12/20 0622) Last BM Date: 08/08/12 Diet: clears, 1080 ml PO recorded, +BM, afebrile, VSS, last K+ 2.8  Intake/Output from previous day: 12/19 0701 - 12/20 0700 In: 960 [P.O.:960] Out: 40 [Drains:40] Intake/Output this shift:    General appearance: alert, cooperative and no distress GI: soft, mildly tender, +BS, incision C/d/i  Lab Results:   Basename 08/08/12 1251  WBC 7.4  HGB 13.6  HCT 39.4  PLT 180    BMET  Basename 08/09/12 0602 08/08/12 1251  NA 149* 146*  K 2.9* 2.6*  CL 112 109  CO2 29 27  GLUCOSE 126* 191*  BUN 9 15  CREATININE 0.38* 0.43*  CALCIUM 8.5 8.6   PT/INR No results found for this basename: LABPROT:2,INR:2 in the last 72 hours   Lab 08/08/12 1251 08/02/12 1256  AST 31 12  ALT 27 15  ALKPHOS 66 74  BILITOT 0.7 1.4*  PROT 6.1 8.6*  ALBUMIN 2.8* 4.3     Lipase     Component Value Date/Time   LIPASE 51 08/02/2012 1256     Studies/Results: No results found.  Medications:    . enalapril  5 mg Oral QHS  . enoxaparin (LOVENOX) injection  40 mg Subcutaneous Q24H  . insulin aspart  0-9 Units Subcutaneous Q4H  . insulin glargine  20 Units Subcutaneous BID  . levothyroxine  175 mcg Oral Q breakfast  . metoprolol  5 mg Intravenous Q6H  . pantoprazole (PROTONIX) IV  40 mg Intravenous QHS  . potassium chloride  30 mEq Oral TID AC & HS    Assessment/Plan 1. POD#5: Incarcerated hernia with SBO, S/p Ventral hernia repair with extensive lysis of adhesions, [name], MD, 08/03/2012:  tolerating regular diet, +bowel function  --ready to go from surgical standpoint but still with hypokalemia  --?increase PO dose vs waiting to see if it will normalized Diabetes mellitus  Back pain  Thyroid disease  Rosacea Body mass index is 35.8 Obtunded on 08/05/12, PCA held,  .        LOS: 7 days    Aairah Negrette 08/09/2012

## 2012-08-10 DIAGNOSIS — E876 Hypokalemia: Secondary | ICD-10-CM | POA: Diagnosis not present

## 2012-08-10 DIAGNOSIS — L719 Rosacea, unspecified: Secondary | ICD-10-CM | POA: Insufficient documentation

## 2012-08-10 DIAGNOSIS — E039 Hypothyroidism, unspecified: Secondary | ICD-10-CM | POA: Insufficient documentation

## 2012-08-10 DIAGNOSIS — K436 Other and unspecified ventral hernia with obstruction, without gangrene: Secondary | ICD-10-CM | POA: Diagnosis present

## 2012-08-10 DIAGNOSIS — G8929 Other chronic pain: Secondary | ICD-10-CM | POA: Insufficient documentation

## 2012-08-10 LAB — BASIC METABOLIC PANEL
BUN: 7 mg/dL (ref 6–23)
Chloride: 107 mEq/L (ref 96–112)
GFR calc Af Amer: >90 mL/min (ref >90)
GFR calc non Af Amer: >90 mL/min (ref >90)
Potassium: 3.9 mEq/L (ref 3.5–5.1)
Sodium: 142 mEq/L (ref 135–145)

## 2012-08-10 LAB — GLUCOSE, CAPILLARY: Glucose-Capillary: 197 mg/dL — ABNORMAL HIGH (ref 70–99)

## 2012-08-10 MED ORDER — POTASSIUM CHLORIDE CRYS ER 15 MEQ PO TBCR: 30.0000 meq | EXTENDED_RELEASE_TABLET | Freq: Three times a day (TID) | ORAL | Status: DC

## 2012-08-10 MED ORDER — MAGIC MOUTHWASH W/LIDOCAINE: 5.0000 mL | Freq: Three times a day (TID) | ORAL | Status: DC | PRN

## 2012-08-10 MED ORDER — NYSTATIN 100000 UNIT/ML MT SUSP: 5.0000 mL | Freq: Four times a day (QID) | OROMUCOSAL | Status: DC

## 2012-08-10 MED ORDER — HYDROCODONE-ACETAMINOPHEN 5-325 MG PO TABS: 1.0000 | ORAL_TABLET | ORAL | Status: DC | PRN

## 2012-08-10 NOTE — Discharge Summary (Signed)
Physician Discharge Summary  Patient ID: Nancy [name] MRN: [id number] DOB/AGE: Apr 13, 1966 46 y.o.  Admit date: 08/02/2012 Discharge date: 08/10/2012  Discharge Diagnoses Patient Active Problem List   Diagnosis Date Noted  . Chronic back pain 08/10/2012  . Hypothyroidism 08/10/2012  . Ventral hernia with obstruction 08/10/2012  . Rosacea 08/10/2012  . Hypokalemia 08/10/2012  . DM 02/15/2009    Consultants None   Procedures Ventral hernia repair by Dr. Jimmye Norman   HPI: Nancy [name] presented after a three-day history of abdominal pain that had been worsening primarily centered on the left side of her abdomen in particular her left lower quadrant. This had been associated with significant nausea which prevented her from eating. She also has had several episodes of emesis. She denies any fevers. She denies any urinary symptoms. She has not passed flatus or had a bowel movement in the last 3 days either. She has a prior history of a colonic resection for what sounds to be possibly ischemia done in [facility]. She had a left lower quadrant colostomy at that time. Subsequent to that she underwent reversal of her colostomy. This eventually was complicated by a hernia that was repaired in [place], according to the patient, with a laparotomy and a mesh repair. She also has had a cholecystectomy and endometrial ablation. Workup showed a ventral hernia with small bowel inside causing an obstruction. She was taken to the OR for repair.   Hospital Course: The patient underwent the procedure without complication. She had the expected post-operative ileus that resolved quickly. Her diet was able to be advanced to regular without difficulty. She became somewhat oversedated on her PCA initially and this was stopped in favor of oral medications. She did much better after that. She did have some issues with hypokalemia but this was corrected here in the hospital and she was discharged on  supplementation. She also developed some thrush-like symptoms and was started on nystatin mouthwash. After several days her pain was controlled and she was able to be discharged home in improved condition.      Medication List     As of 08/10/2012 12:16 PM    TAKE these medications         acetaminophen 500 MG tablet   Commonly known as: TYLENOL   Take 1,000 mg by mouth every 6 (six) hours as needed. For pain      aspirin 81 MG EC tablet   Take 81 mg by mouth every evening.      atorvastatin 10 MG tablet   Commonly known as: LIPITOR   Take 10 mg by mouth at bedtime.      Cholecalciferol 5000 UNITS capsule   Take 5,000 Units by mouth every morning. Vitamin D      doxycycline 100 MG tablet   Commonly known as: VIBRA-TABS   Take 100 mg by mouth 2 (two) times daily as needed. For rosacea      enalapril 5 MG tablet   Commonly known as: VASOTEC   Take 5 mg by mouth at bedtime.      HYDROcodone-acetaminophen 5-325 MG per tablet   Commonly known as: NORCO/VICODIN   Take 1-2 tablets by mouth every 4 (four) hours as needed.      INVOKANA 300 MG Tabs   Generic drug: Canagliflozin   Take 300 mg by mouth every morning. Before breakfast      LANTUS SOLOSTAR 100 UNIT/ML injection   Generic drug: insulin glargine   Inject 60 Units into the  skin 2 (two) times daily.      levothyroxine 175 MCG tablet   Commonly known as: SYNTHROID, LEVOTHROID   Take 175 mcg by mouth every morning.      magic mouthwash w/lidocaine Soln   Take 5 mLs by mouth 3 (three) times daily as needed.      metFORMIN 1000 MG tablet   Commonly known as: GLUCOPHAGE   Take 1,000 mg by mouth 2 (two) times daily.      methocarbamol 500 MG tablet   Commonly known as: ROBAXIN   Take 500 mg by mouth every 8 (eight) hours as needed. For muscle spasms      metroNIDAZOLE 0.75 % cream   Commonly known as: METROCREAM   Apply 1 application topically 2 (two) times daily as needed. For rosacea      nystatin 100000  UNIT/ML suspension   Commonly known as: MYCOSTATIN   Take 5 mLs (500,000 Units total) by mouth 4 (four) times daily.      pantoprazole 20 MG tablet   Commonly known as: PROTONIX   Take 2 tablets (40 mg total) by mouth daily.      potassium chloride SA 15 MEQ tablet   Commonly known as: KLOR-CON M15   Take 2 tablets (30 mEq total) by mouth 4 (four) times daily -  before meals and at bedtime.      promethazine 25 MG tablet   Commonly known as: PHENERGAN   Take 1 tablet (25 mg total) by mouth every 6 (six) hours as needed for nausea.      VICTOZA 18 MG/3ML Soln   Generic drug: Liraglutide   Inject 1.8 mg into the skin at bedtime.             Follow-up Information    Follow up with [facility]. Call on 08/12/2012.   Contact information:   [address] [place] [address] [phone]         Signed: Melody Haver Pager: [phone]   08/10/2012, 12:16 PM

## 2012-08-10 NOTE — Progress Notes (Signed)
Discussed discharge instructions and medications with pt. Pt showed no barriers to discharge. IV removed. Assessment unchanged from morning. Abdominal dressing changed prior to discharge. Pt discharged to home with husband.

## 2012-08-10 NOTE — Progress Notes (Signed)
Patient ID: Nancy [name], female   DOB: 10/29/1965, 46 y.o.   MRN: [id number]   LOS: 8 days   Subjective: No c/o. Ready to go home.  Objective: Vital signs in last 24 hours: Temp:  [97.9 F (36.6 C)-98.1 F (36.7 C)] 98.1 F (36.7 C) (12/21 0620) Pulse Rate:  [63-75] 63  (12/21 0620) Resp:  [18-19] 18  (12/21 0620) BP: (107-125)/(57-70) 107/57 mmHg (12/21 0620) SpO2:  [95 %-98 %] 96 % (12/21 0620) Last BM Date: 08/09/12   JP: 212ml/24h   Lab Results:  BMET  Basename 08/10/12 1050 08/09/12 0602  NA 142 149*  K 3.9 2.9*  CL 107 112  CO2 24 29  GLUCOSE 243* 126*  BUN 7 9  CREATININE 0.34* 0.38*  CALCIUM 8.5 8.5    General appearance: alert and no distress Resp: clear to auscultation bilaterally Cardio: regular rate and rhythm GI: Soft, +BS. Incision C/D/I.   Assessment/Plan: VH w/SBO s/p VHR Hypokalemia -- Resolved Mult med probs -- Stable Dispo -- Home   Freeman Caldron, PA-C Pager: [phone] [facility] Pager: [phone]   08/10/2012

## 2012-08-11 NOTE — Progress Notes (Signed)
Nancy [name] M. Nancy Tagliaferro, MD, FACS General, Bariatric, & Minimally Invasive Surgery [facility]  

## 2012-08-11 NOTE — Discharge Summary (Signed)
Miyoko Hashimi M. Leshaun Biebel, MD, FACS General, Bariatric, & Minimally Invasive Surgery [facility]  

## 2012-08-12 ENCOUNTER — Telehealth (INDEPENDENT_AMBULATORY_CARE_PROVIDER_SITE_OTHER): Payer: Self-pay | Admitting: General Surgery

## 2012-08-12 NOTE — Telephone Encounter (Signed)
Patient called in stating she was instructed at discharge from hospital to call and be seen on 08/13/12 for staple removal, also has a drain tube in. Hernia repair done by Dr. Lindie Spruce. Called Marcelino Duster who advised she can come in to for a nurse only visit tomorrow at 10:20. Patient advised and agreed.

## 2012-08-13 ENCOUNTER — Ambulatory Visit (INDEPENDENT_AMBULATORY_CARE_PROVIDER_SITE_OTHER): Payer: Medicare Other

## 2012-08-13 DIAGNOSIS — Z4802 Encounter for removal of sutures: Secondary | ICD-10-CM

## 2012-08-13 DIAGNOSIS — B370 Candidal stomatitis: Secondary | ICD-10-CM

## 2012-08-13 MED ORDER — MAGIC MOUTHWASH W/LIDOCAINE: 5.0000 mL | Freq: Three times a day (TID) | ORAL | Status: DC | PRN

## 2012-08-13 NOTE — Progress Notes (Signed)
Patient came in for staple removal and drain check from hernia repair on 12/14. Patient has redness around drain site. Dr Lindie Spruce came in and look at area and suggested we go ahead and pull drain since she was draining through wound causing irritation. Dr Lindie Spruce pulled the drain. I then removed all staples and applied steri strips. Patient will follow up with [facility] on 08/22/12.

## 2012-08-15 ENCOUNTER — Encounter (INDEPENDENT_AMBULATORY_CARE_PROVIDER_SITE_OTHER): Payer: Self-pay

## 2012-08-22 ENCOUNTER — Other Ambulatory Visit (INDEPENDENT_AMBULATORY_CARE_PROVIDER_SITE_OTHER): Payer: Self-pay

## 2012-08-22 ENCOUNTER — Ambulatory Visit (INDEPENDENT_AMBULATORY_CARE_PROVIDER_SITE_OTHER): Payer: Medicare Other | Admitting: General Surgery

## 2012-08-22 ENCOUNTER — Encounter (INDEPENDENT_AMBULATORY_CARE_PROVIDER_SITE_OTHER): Payer: Self-pay | Admitting: General Surgery

## 2012-08-22 VITALS — BP 124/80 | HR 74 | Temp 98.1°F | Resp 16 | Ht 66.0 in | Wt 218.8 lb

## 2012-08-22 DIAGNOSIS — IMO0002 Reserved for concepts with insufficient information to code with codable children: Secondary | ICD-10-CM | POA: Diagnosis not present

## 2012-08-22 DIAGNOSIS — Z9889 Other specified postprocedural states: Secondary | ICD-10-CM | POA: Diagnosis not present

## 2012-08-22 LAB — BASIC METABOLIC PANEL
Calcium: 9.5 mg/dL (ref 8.4–10.5)
Creat: 0.53 mg/dL (ref 0.50–1.10)
Glucose, Bld: 85 mg/dL (ref 70–99)
Sodium: 142 mEq/L (ref 135–145)

## 2012-08-22 MED ORDER — AMOXICILLIN-POT CLAVULANATE 875-125 MG PO TABS: 1.0000 | ORAL_TABLET | Freq: Two times a day (BID) | ORAL | Status: AC

## 2012-08-22 NOTE — Progress Notes (Signed)
The patient comes in status post repair of large ventral hernia along with the extensive enterocleisis for bowel obstruction.  The patient has a large amount of pannus in the lower portion of the abdomen and in the area of a previous hernia there is a large amount of fluid accumulation. There is been some drainage onto her clothing.  After cleaning the area with Betadine I was able to aspirate approximately 85 cc of slightly turbid serous fluid from the lower portion of the wound. There was minimal erythema. The patient states that she has been running a low-grade fever although she is afebrile in the office today.  A culture was sent. A we'll start the patient on Augmentin. I have her come back to see me on January 10 for recheck.

## 2012-08-23 ENCOUNTER — Telehealth (INDEPENDENT_AMBULATORY_CARE_PROVIDER_SITE_OTHER): Payer: Self-pay

## 2012-08-23 NOTE — Telephone Encounter (Signed)
Called pt and told her potassium was good, no need to take potassium pill. Also she said pharmacy called her and they did receive pain med refill.

## 2012-08-23 NOTE — Telephone Encounter (Signed)
Message copied by Brennan Bailey on Fri Aug 23, 2012  9:34 AM ------      Message from: Marnette Burgess      Created: Fri Aug 23, 2012  9:20 AM      Contact: [phone]       Calling for results on her potassium from labs drawn yesterday and also when she went to the pharmacy to pick up her meds they didn't have an order for her pain meds, please call.

## 2012-08-25 LAB — WOUND CULTURE
Gram Stain: NONE SEEN
Gram Stain: NONE SEEN

## 2012-08-30 ENCOUNTER — Encounter (INDEPENDENT_AMBULATORY_CARE_PROVIDER_SITE_OTHER): Payer: Self-pay | Admitting: General Surgery

## 2012-08-30 ENCOUNTER — Ambulatory Visit (INDEPENDENT_AMBULATORY_CARE_PROVIDER_SITE_OTHER): Payer: Medicare Other | Admitting: General Surgery

## 2012-08-30 VITALS — BP 112/72 | HR 86 | Temp 97.7°F | Resp 16 | Ht 66.0 in | Wt 215.6 lb

## 2012-08-30 DIAGNOSIS — IMO0002 Reserved for concepts with insufficient information to code with codable children: Secondary | ICD-10-CM | POA: Diagnosis not present

## 2012-08-30 NOTE — Progress Notes (Signed)
The patient is doing well status post ventral hernia repair an enterocleisis.  She continues to drain straw-colored fluid from her midline wound. It is not gaping open. She states that she usually has to change 1 ABG pad daily.  The cultures from her wound did not grow out any bacteria. She is taking Augmentin because of the possibility of wound infection. She asked about 12 tablets left I told her to take those until they were gone.  Today in the office though some clear yellow fluid without any foul smell coming from her midline lower portion of the wound in the area of the deepest pannus. I was able to aspirate approximately 180 cc of clear straw-colored fluid through the midline incision after it had been prepped with Betadine.  We subsequently dressed the wound with  Triple antibiotic ointment and 4 x 4 gauze. A binder was subsequently applied. I will see her back in 3 weeks for further followup.

## 2012-09-02 ENCOUNTER — Ambulatory Visit (HOSPITAL_COMMUNITY): Payer: Medicare Other

## 2012-09-03 DIAGNOSIS — B353 Tinea pedis: Secondary | ICD-10-CM | POA: Diagnosis not present

## 2012-09-03 DIAGNOSIS — K56609 Unspecified intestinal obstruction, unspecified as to partial versus complete obstruction: Secondary | ICD-10-CM | POA: Diagnosis not present

## 2012-09-05 ENCOUNTER — Telehealth (INDEPENDENT_AMBULATORY_CARE_PROVIDER_SITE_OTHER): Payer: Self-pay | Admitting: General Surgery

## 2012-09-05 NOTE — Telephone Encounter (Signed)
Pt called for pain meds.  Review of records show no refills since surgery; will call in Vicodin per standing orders.  Hydrocodone 5/325 mg,  # 30, 1-2 po Q4-6H prn pain, no refill called to [facility]:  [phone].

## 2012-09-17 ENCOUNTER — Encounter (INDEPENDENT_AMBULATORY_CARE_PROVIDER_SITE_OTHER): Payer: Self-pay | Admitting: General Surgery

## 2012-09-17 ENCOUNTER — Ambulatory Visit (INDEPENDENT_AMBULATORY_CARE_PROVIDER_SITE_OTHER): Payer: Medicare Other | Admitting: General Surgery

## 2012-09-17 VITALS — BP 120/82 | HR 72 | Temp 97.0°F | Resp 14 | Ht 67.0 in | Wt 214.2 lb

## 2012-09-17 DIAGNOSIS — IMO0002 Reserved for concepts with insufficient information to code with codable children: Secondary | ICD-10-CM

## 2012-09-17 NOTE — Progress Notes (Signed)
The patient comes in today doing very well. She states that she's had no drainage from her abdominal wound for the last 3 days.  On examination her wound is healed well. There is no drainage from the wound however the pannus does appear to be heavy as if there is a seroma underneath.  The patient is also complaining of a rash in her bilateral upper extremities. She is not taking any antibiotics for this the last 12 days. This rash appears to be unrelated to the Augmentin which was taken. She did recently get a dose of topical ketoconazole for a inframammary rash related to Candida.  Under sterile conditions I was able to aspirate 225 cc of clear-looking serous fluid from her abdominal wound. Those minimal discomfort. I went through the midline wound. We covered it with antibiotic ointment and 4 x 4 gauze. She's return to see me in approximately 3 weeks. This will be strictly for reassessment and may be re\re aspiration. She's otherwise doing well.  For the patient's rash in her upper extremities I have taken pictures which I will attempt to download into the electronic medical record. I suggested that she use 1% hydrocortisone cream twice a day for the rash.

## 2012-09-23 ENCOUNTER — Telehealth (INDEPENDENT_AMBULATORY_CARE_PROVIDER_SITE_OTHER): Payer: Self-pay

## 2012-09-23 ENCOUNTER — Other Ambulatory Visit (INDEPENDENT_AMBULATORY_CARE_PROVIDER_SITE_OTHER): Payer: Self-pay | Admitting: General Surgery

## 2012-09-23 NOTE — Telephone Encounter (Signed)
Spoke with Chelsey at [employer] for medication she faxed form and ref number was given .This information was given to [facility] /Dr  Lindie Spruce

## 2012-09-23 NOTE — Telephone Encounter (Signed)
Pt called stating she spoke with Marcelino Duster and was advised to call and ask for her when she needed refill on her hydrocodone. Pt states Marcelino Duster would do prior [place]. For her insurance. Pt request refill be called to [facility] [phone].  Pt can be reached at [phone].

## 2012-10-01 ENCOUNTER — Ambulatory Visit (INDEPENDENT_AMBULATORY_CARE_PROVIDER_SITE_OTHER): Payer: Medicare Other | Admitting: General Surgery

## 2012-10-01 ENCOUNTER — Encounter (INDEPENDENT_AMBULATORY_CARE_PROVIDER_SITE_OTHER): Payer: Self-pay | Admitting: General Surgery

## 2012-10-01 VITALS — BP 112/70 | HR 76 | Temp 97.2°F | Resp 16 | Ht 66.0 in | Wt 215.0 lb

## 2012-10-01 DIAGNOSIS — Z09 Encounter for follow-up examination after completed treatment for conditions other than malignant neoplasm: Secondary | ICD-10-CM | POA: Insufficient documentation

## 2012-10-01 NOTE — Progress Notes (Signed)
I'm happy to report that the patient is doing well with minimal reaccumulation of fluid. In the left lower side of her pannus there is a small superficial pustular area that has broken out. The patient has kept the wound covered with 4 x 4 gauze.  On examination today I did not probe deeply into the small open area in the lower aspect of her pannus. It appeared to be a superficial infection. He can be treated with soap and water and troponin I corner covered by 4 x 4 gauze. She is return to see me in one month for her final wound check. No drainage of seroma was performed today.

## 2012-10-11 DIAGNOSIS — E1129 Type 2 diabetes mellitus with other diabetic kidney complication: Secondary | ICD-10-CM | POA: Diagnosis not present

## 2012-10-11 DIAGNOSIS — E785 Hyperlipidemia, unspecified: Secondary | ICD-10-CM | POA: Diagnosis not present

## 2012-10-18 DIAGNOSIS — E559 Vitamin D deficiency, unspecified: Secondary | ICD-10-CM | POA: Diagnosis not present

## 2012-10-18 DIAGNOSIS — E1129 Type 2 diabetes mellitus with other diabetic kidney complication: Secondary | ICD-10-CM | POA: Diagnosis not present

## 2012-10-22 ENCOUNTER — Encounter (INDEPENDENT_AMBULATORY_CARE_PROVIDER_SITE_OTHER): Payer: Medicare Other | Admitting: General Surgery

## 2012-11-21 ENCOUNTER — Ambulatory Visit (INDEPENDENT_AMBULATORY_CARE_PROVIDER_SITE_OTHER): Payer: Medicare Other | Admitting: General Surgery

## 2012-11-21 ENCOUNTER — Encounter (INDEPENDENT_AMBULATORY_CARE_PROVIDER_SITE_OTHER): Payer: Self-pay | Admitting: General Surgery

## 2012-11-21 ENCOUNTER — Other Ambulatory Visit: Payer: Self-pay | Admitting: Obstetrics & Gynecology

## 2012-11-21 VITALS — BP 130/76 | HR 68 | Temp 97.6°F | Resp 18 | Ht 63.0 in | Wt 218.0 lb

## 2012-11-21 DIAGNOSIS — Z09 Encounter for follow-up examination after completed treatment for conditions other than malignant neoplasm: Secondary | ICD-10-CM

## 2012-11-21 DIAGNOSIS — E119 Type 2 diabetes mellitus without complications: Secondary | ICD-10-CM | POA: Diagnosis not present

## 2012-11-21 DIAGNOSIS — Z139 Encounter for screening, unspecified: Secondary | ICD-10-CM

## 2012-11-21 DIAGNOSIS — I10 Essential (primary) hypertension: Secondary | ICD-10-CM | POA: Diagnosis not present

## 2012-11-21 NOTE — Progress Notes (Signed)
The patient is doing much better. She has no more drainage from her wound. She showed me a site on her left flank where she had a burn from her binder. This can be treated with Mederma cream.  On examination of her midline wound she has still some induration in the lower portion of her wound, but no drainage. There was no erythema.  The patient will followup with me on an as-needed basis.

## 2012-11-25 ENCOUNTER — Ambulatory Visit (HOSPITAL_COMMUNITY)
Admission: RE | Admit: 2012-11-25 | Discharge: 2012-11-25 | Disposition: A | Discharge status: Home / Self Care | Payer: Medicare Other | Source: Ambulatory Visit | Attending: Obstetrics & Gynecology | Admitting: Obstetrics & Gynecology

## 2012-11-25 DIAGNOSIS — Z1231 Encounter for screening mammogram for malignant neoplasm of breast: Secondary | ICD-10-CM | POA: Diagnosis not present

## 2012-11-25 DIAGNOSIS — Z139 Encounter for screening, unspecified: Secondary | ICD-10-CM

## 2012-11-27 ENCOUNTER — Other Ambulatory Visit: Payer: Self-pay | Admitting: Obstetrics & Gynecology

## 2012-11-27 DIAGNOSIS — R928 Other abnormal and inconclusive findings on diagnostic imaging of breast: Secondary | ICD-10-CM

## 2012-12-02 DIAGNOSIS — D235 Other benign neoplasm of skin of trunk: Secondary | ICD-10-CM | POA: Diagnosis not present

## 2012-12-02 DIAGNOSIS — L719 Rosacea, unspecified: Secondary | ICD-10-CM | POA: Diagnosis not present

## 2012-12-09 ENCOUNTER — Emergency Department (HOSPITAL_COMMUNITY): Payer: Medicare Other

## 2012-12-09 ENCOUNTER — Inpatient Hospital Stay (HOSPITAL_COMMUNITY)
Admission: EM | Admit: 2012-12-09 | Discharge: 2012-12-11 | DRG: 863 | Disposition: A | Discharge status: Home / Self Care | Payer: Medicare Other | Attending: General Surgery | Admitting: General Surgery

## 2012-12-09 ENCOUNTER — Telehealth (INDEPENDENT_AMBULATORY_CARE_PROVIDER_SITE_OTHER): Payer: Self-pay

## 2012-12-09 ENCOUNTER — Encounter (HOSPITAL_COMMUNITY): Payer: Self-pay | Admitting: *Deleted

## 2012-12-09 ENCOUNTER — Encounter: Payer: Self-pay | Admitting: Obstetrics & Gynecology

## 2012-12-09 DIAGNOSIS — I10 Essential (primary) hypertension: Secondary | ICD-10-CM | POA: Diagnosis present

## 2012-12-09 DIAGNOSIS — IMO0002 Reserved for concepts with insufficient information to code with codable children: Principal | ICD-10-CM | POA: Diagnosis present

## 2012-12-09 DIAGNOSIS — Z7982 Long term (current) use of aspirin: Secondary | ICD-10-CM | POA: Diagnosis not present

## 2012-12-09 DIAGNOSIS — M793 Panniculitis, unspecified: Secondary | ICD-10-CM | POA: Diagnosis present

## 2012-12-09 DIAGNOSIS — E039 Hypothyroidism, unspecified: Secondary | ICD-10-CM | POA: Diagnosis present

## 2012-12-09 DIAGNOSIS — E785 Hyperlipidemia, unspecified: Secondary | ICD-10-CM | POA: Diagnosis present

## 2012-12-09 DIAGNOSIS — E119 Type 2 diabetes mellitus without complications: Secondary | ICD-10-CM | POA: Diagnosis present

## 2012-12-09 DIAGNOSIS — Y849 Medical procedure, unspecified as the cause of abnormal reaction of the patient, or of later complication, without mention of misadventure at the time of the procedure: Secondary | ICD-10-CM | POA: Diagnosis present

## 2012-12-09 DIAGNOSIS — R188 Other ascites: Secondary | ICD-10-CM | POA: Diagnosis not present

## 2012-12-09 DIAGNOSIS — M545 Low back pain, unspecified: Secondary | ICD-10-CM | POA: Diagnosis present

## 2012-12-09 DIAGNOSIS — R1031 Right lower quadrant pain: Secondary | ICD-10-CM | POA: Diagnosis not present

## 2012-12-09 DIAGNOSIS — R109 Unspecified abdominal pain: Secondary | ICD-10-CM | POA: Diagnosis not present

## 2012-12-09 HISTORY — DX: Ventral hernia without obstruction or gangrene: K43.9

## 2012-12-09 HISTORY — DX: Unspecified intestinal obstruction, unspecified as to partial versus complete obstruction: K56.609

## 2012-12-09 LAB — CBC WITH DIFFERENTIAL/PLATELET
Basophils Absolute: 0 10*3/uL (ref 0.0–0.1)
Basophils Relative: 0 % (ref 0–1)
HCT: 41.7 % (ref 36.0–46.0)
Lymphocytes Relative: 29 % (ref 12–46)
MCHC: 34.8 g/dL (ref 30.0–36.0)
Monocytes Absolute: 0.7 10*3/uL (ref 0.1–1.0)
Neutro Abs: 5.5 10*3/uL (ref 1.7–7.7)
Neutrophils Relative %: 62 % (ref 43–77)
Platelets: 276 10*3/uL (ref 150–400)
RDW: 13.5 % (ref 11.5–15.5)
WBC: 8.9 10*3/uL (ref 4.0–10.5)

## 2012-12-09 LAB — COMPREHENSIVE METABOLIC PANEL
Alkaline Phosphatase: 57 U/L (ref 39–117)
CO2: 25 mEq/L (ref 19–32)
Calcium: 9.4 mg/dL (ref 8.4–10.5)
Chloride: 103 mEq/L (ref 96–112)
GFR calc Af Amer: >90 mL/min (ref >90)
Potassium: 3.6 mEq/L (ref 3.5–5.1)
Sodium: 139 mEq/L (ref 135–145)
Total Protein: 7.5 g/dL (ref 6.0–8.3)

## 2012-12-09 LAB — URINALYSIS, MICROSCOPIC ONLY
Bilirubin Urine: NEGATIVE
Nitrite: NEGATIVE
Specific Gravity, Urine: 1.027 (ref 1.005–1.030)
Urobilinogen, UA: 0.2 mg/dL (ref 0.0–1.0)

## 2012-12-09 LAB — LIPASE, BLOOD: Lipase: 47 U/L (ref 11–59)

## 2012-12-09 MED ORDER — HYDROMORPHONE HCL PF 1 MG/ML IJ SOLN
1.0000 mg | Freq: Once | INTRAMUSCULAR | Status: AC
  Administered 2012-12-09: 1 mg via INTRAVENOUS
  Filled 2012-12-09: qty 1

## 2012-12-09 MED ORDER — IOHEXOL 300 MG/ML  SOLN
50.0000 mL | Freq: Once | INTRAMUSCULAR | Status: AC | PRN
  Administered 2012-12-09: 50 mL via ORAL

## 2012-12-09 MED ORDER — ONDANSETRON HCL 4 MG/2ML IJ SOLN
4.0000 mg | Freq: Once | INTRAMUSCULAR | Status: AC
  Administered 2012-12-09: 4 mg via INTRAVENOUS
  Filled 2012-12-09: qty 2

## 2012-12-09 MED ORDER — IOHEXOL 300 MG/ML  SOLN
100.0000 mL | Freq: Once | INTRAMUSCULAR | Status: AC | PRN
  Administered 2012-12-09: 100 mL via INTRAVENOUS

## 2012-12-09 NOTE — Telephone Encounter (Signed)
Spoke to [place] MD who suggests patient should be referred to ED due to various symptoms that don't corollate with a recent procedure.  Patient made aware at this time.  Patient is very anxious about this being her appendix however it doesn't sound like it is in the same area as the appendix.  Patient states she will go to [facility] ED.

## 2012-12-09 NOTE — ED Provider Notes (Signed)
History     CSN: [id number]  Arrival date & time 12/09/12  1715   First MD Initiated Contact with Patient 12/09/12 2033      Chief Complaint  Patient presents with  . Abdominal Pain    (Consider location/radiation/quality/duration/timing/severity/associated sxs/prior treatment) HPI History provided by pt.   Pt has had severe, non-radiating, intermittent pain in RLQ for the past 3 days.  Describes as pulling/burning.  No aggravating/alleviating factors.  Associated w/ N/V and abdominal swelling.  Denies fever, anorexia, change in bowels, urinary and vaginal sx.  Has never had pain like this before.  H/o several abdominal surgeries, most recently ventral hernia repairs in 07/2012, as well as SBO in 08/02/12.   Past Medical History  Diagnosis Date  . Diabetes mellitus   . Back pain   . Thyroid disease   . Rosacea   . Ventral hernia   . Small bowel obstruction     Past Surgical History  Procedure Laterality Date  . Cholecystectomy    . Endometrial ablation    . Back surgery    . Colon surgery    . Colostomy    . Revision colostomy    . Ventral hernia repair  08/03/2012    Procedure: HERNIA REPAIR VENTRAL ADULT;  Surgeon: [name], MD;  Location: [facility] OR;  Service: General;  Laterality: N/A;  . Laparotomy  08/03/2012    Procedure: EXPLORATORY LAPAROTOMY;  Surgeon: [name], MD;  Location: [facility] OR;  Service: General;  Laterality: N/A;  with extensive enterolysis  . Insertion of mesh  08/03/2012    Procedure: INSERTION OF MESH;  Surgeon: [name], MD;  Location: [facility] OR;  Service: General;  Laterality: N/A;  . Carpatunnel surgery    . Liver biopsy      No family history on file.  History  Substance Use Topics  . Smoking status: Never Smoker   . Smokeless tobacco: Not on file  . Alcohol Use: No    OB History   Grav Para Term Preterm Abortions TAB SAB Ect Mult Living                  Review of Systems  All other systems reviewed and are  negative.    Allergies  Chocolate and Sulfa antibiotics  Home Medications   Current Outpatient Rx  Name  Route  Sig  Dispense  Refill  . aspirin 81 MG EC tablet   Oral   Take 81 mg by mouth every evening.          Marland Kitchen atorvastatin (LIPITOR) 10 MG tablet   Oral   Take 10 mg by mouth at bedtime.          . Canagliflozin (INVOKANA) 300 MG TABS   Oral   Take 300 mg by mouth every morning. Before breakfast         . Cholecalciferol 5000 UNITS capsule   Oral   Take 5,000 Units by mouth every morning. Vitamin D         . doxycycline (VIBRA-TABS) 100 MG tablet   Oral   Take 100 mg by mouth 2 (two) times daily as needed. For rosacea          . enalapril (VASOTEC) 5 MG tablet   Oral   Take 5 mg by mouth at bedtime.           . insulin glargine (LANTUS SOLOSTAR) 100 UNIT/ML injection   Subcutaneous   Inject 30 Units into  the skin 2 (two) times daily.          Marland Kitchen levothyroxine (SYNTHROID, LEVOTHROID) 150 MCG tablet   Oral   Take 150 mcg by mouth daily before breakfast.         . Liraglutide (VICTOZA) 18 MG/3ML SOLN   Subcutaneous   Inject 1.8 mg into the skin at bedtime.          . metFORMIN (GLUCOPHAGE) 1000 MG tablet   Oral   Take 1,000 mg by mouth 2 (two) times daily.           . methocarbamol (ROBAXIN) 500 MG tablet   Oral   Take 500 mg by mouth every 8 (eight) hours as needed. For muscle spasms         . metroNIDAZOLE (METROCREAM) 0.75 % cream   Topical   Apply 1 application topically 2 (two) times daily as needed. For rosacea            BP 119/81  Pulse 79  Temp(Src) 97.7 F (36.5 C) (Oral)  Resp 18  SpO2 97%  Physical Exam  Nursing note and vitals reviewed. Constitutional: She is oriented to person, place, and time. She appears well-developed and well-nourished.  Pt appears very uncomfortable and is tearful and mildly anxious  HENT:  Head: Normocephalic and atraumatic.  Eyes:  Normal appearance  Neck: Normal range of motion.   Cardiovascular: Normal rate and regular rhythm.   Pulmonary/Chest: Effort normal and breath sounds normal. No respiratory distress.  Abdominal: Soft. Bowel sounds are normal. She exhibits no mass. There is no rebound.  Obese.  Several surgical scars.  Diffuse tenderness, worst in RLQ w/ guarding.   Genitourinary:  No CVA tenderness  Musculoskeletal: Normal range of motion.  Neurological: She is alert and oriented to person, place, and time.  Skin: Skin is warm and dry. No rash noted.  Psychiatric: She has a normal mood and affect. Her behavior is normal.    ED Course  Procedures (including critical care time)  Labs Reviewed  COMPREHENSIVE METABOLIC PANEL - Abnormal; Notable for the following:    Creatinine, Ser 0.46 (*)    All other components within normal limits  URINALYSIS, MICROSCOPIC ONLY - Abnormal; Notable for the following:    Glucose, UA >1000 (*)    Hgb urine dipstick TRACE (*)    Ketones, ur 15 (*)    All other components within normal limits  CBC WITH DIFFERENTIAL  LIPASE, BLOOD   Ct Abdomen Pelvis W Contrast  12/09/2012  *RADIOLOGY REPORT*  Clinical Data: Abdominal pain  CT ABDOMEN AND PELVIS WITH CONTRAST  Technique:  Multidetector CT imaging of the abdomen and pelvis was performed following the standard protocol during bolus administration of intravenous contrast.  Contrast: OMNIPAQUE IOHEXOL 300 MG/ML  SOLN  Comparison: 08/03/2012  Findings: Limited images through the lung bases demonstrate no significant appreciable abnormality. The heart size is within normal limits. No pleural or pericardial effusion.  8 mm hypodensity within the right hepatic lobe on image 27 is nonspecific, unchanged.  Posterior to this, there is a hypodensity on image 25 that appears water density, most in keeping with a biliary cyst.  Focal hypodensity adjacent to the falciform is unchanged and favored to reflect focal fat or variant perfusion.  Absent gallbladder.  No biliary ductal  dilatation.  Unremarkable spleen, pancreas, right adrenal gland.  Left adrenal nodule measuring 1.5 cm is indeterminate however without significant interval change.  There are subcentimeter hypodensities within the left  kidney, incompletely characterized. Otherwise, symmetric renal enhancement. No hydronephrosis or hydroureter.  Sigmoid colonic anastomotic suture.  Colonic diverticulosis.  No CT evidence for colitis or diverticulitis.  Normal appendix.  Small bowel loops are normal course and caliber.  No free intraperitoneal air or fluid.  Infraumbilical ventral subcutaneous fluid pocket measuring 4.2 x 2.5 cm.  Thickening/stranding of the adjacent subcutaneous fat.  No peritoneal communication.  No lymphadenopathy.  Normal caliber aorta and branch vessels.  Thin-walled bladder.  Unremarkable CT appearance to the uterus. Bilateral ovarian cysts and a more tubular cystic density left adnexa may reflect hydrosalpinx or a paraovarian cyst.  Multilevel degenerative changes of the imaged spine. No acute or aggressive appearing osseous lesion.  Postoperative changes of the lower lumbar spine with L4-5 fusion.  IMPRESSION:  2.5 x 4.2 cm fluid pocket within the infraumbilical ventral subcutaneous fat.  Infection not excluded.  Other unchanged findings as above.   Original Report Authenticated By: Jearld Lesch, M.D.      1. Panniculitis       MDM  47yo F w/ h/o several abdominal surgeries as well as SBO, presents w/ severe, intermittent, RLQ pain x 3 days.  On exam, uncomfortable appearing, afebrile, abd soft/non-distended, severe tenderness w/ guarding at RLQ.  Labs unremakarble.  CT pending.  Pt had relief of sx w/ IV dilaudid and zofran.    CT shows infraumbilical fluid collection.  Consulted Dr. Corliss Skains and he will admit for IV abx.  Symptoms are currently controlled but abdominal exam unchanged.          Otilio Miu, PA-C 12/10/12 [phone]

## 2012-12-09 NOTE — ED Notes (Addendum)
Pt with hx of ventral hernia and small bowel obstruction 08/02/12 with mesh placement. PT c/o RLQ pain described as "burning" and "pulling" and is intermittent.  When it comes she feels like she is going to pass out.  Pt called Dr Dixon Boos office today and he stated it did not sound like a problem r/t surgery and that pt should come to ED.  LBM this am. Denies nvd.

## 2012-12-09 NOTE — Telephone Encounter (Signed)
Patient called back to state that the burning, pulling, and pain in her stomach is becoming unbearable.  Patient also states it is causing her to become pale and weak.  Wyatt MD paged, awaiting response at this time.

## 2012-12-09 NOTE — Telephone Encounter (Signed)
The pt called in reporting some burning and pulling in her abdomen.  She had hernia repair back in December.  Her incisions are healed and she has no fever.  She has not done any lifting or activities out of the ordinary.  She has no nausea or vomiting.  Her bowels are moving.  I reviewed her op note and mesh was used.  I told her what she is feeling is still probably part of healing.  I asked her to call if the pains get worse or she notices a bulge.

## 2012-12-10 ENCOUNTER — Inpatient Hospital Stay (HOSPITAL_COMMUNITY): Payer: Medicare Other

## 2012-12-10 ENCOUNTER — Encounter (HOSPITAL_COMMUNITY): Payer: Self-pay | Admitting: *Deleted

## 2012-12-10 DIAGNOSIS — M793 Panniculitis, unspecified: Secondary | ICD-10-CM

## 2012-12-10 DIAGNOSIS — R1031 Right lower quadrant pain: Secondary | ICD-10-CM | POA: Diagnosis not present

## 2012-12-10 DIAGNOSIS — R188 Other ascites: Secondary | ICD-10-CM | POA: Diagnosis not present

## 2012-12-10 LAB — GLUCOSE, CAPILLARY
Glucose-Capillary: 105 mg/dL — ABNORMAL HIGH (ref 70–99)
Glucose-Capillary: 147 mg/dL — ABNORMAL HIGH (ref 70–99)
Glucose-Capillary: 66 mg/dL — ABNORMAL LOW (ref 70–99)
Glucose-Capillary: 72 mg/dL (ref 70–99)
Glucose-Capillary: 75 mg/dL (ref 70–99)

## 2012-12-10 LAB — HEMOGLOBIN A1C: Mean Plasma Glucose: 128 mg/dL — ABNORMAL HIGH (ref <117)

## 2012-12-10 MED ORDER — ENALAPRIL MALEATE 5 MG PO TABS
5.0000 mg | ORAL_TABLET | Freq: Every day | ORAL | Status: DC
  Administered 2012-12-10 (×2): 5 mg via ORAL
  Filled 2012-12-10 (×4): qty 1

## 2012-12-10 MED ORDER — LEVOTHYROXINE SODIUM 150 MCG PO TABS
150.0000 ug | ORAL_TABLET | Freq: Every day | ORAL | Status: DC
  Administered 2012-12-10 – 2012-12-11 (×2): 150 ug via ORAL
  Filled 2012-12-10 (×3): qty 1

## 2012-12-10 MED ORDER — INSULIN ASPART 100 UNIT/ML ~~LOC~~ SOLN
0.0000 [IU] | SUBCUTANEOUS | Status: DC
  Administered 2012-12-10: 3 [IU] via SUBCUTANEOUS
  Administered 2012-12-10: 4 [IU] via SUBCUTANEOUS
  Administered 2012-12-11 (×2): 3 [IU] via SUBCUTANEOUS

## 2012-12-10 MED ORDER — FENTANYL CITRATE 0.05 MG/ML IJ SOLN
INTRAMUSCULAR | Status: AC | PRN
  Administered 2012-12-10: 50 ug via INTRAVENOUS
  Administered 2012-12-10: 25 ug via INTRAVENOUS

## 2012-12-10 MED ORDER — GLUCOSE-VITAMIN C 4-6 GM-MG PO CHEW
4.0000 | CHEWABLE_TABLET | ORAL | Status: DC | PRN
  Administered 2012-12-10: 1 via ORAL

## 2012-12-10 MED ORDER — ATORVASTATIN CALCIUM 10 MG PO TABS
10.0000 mg | ORAL_TABLET | Freq: Every day | ORAL | Status: DC
  Administered 2012-12-10 (×2): 10 mg via ORAL
  Filled 2012-12-10 (×4): qty 1

## 2012-12-10 MED ORDER — CEFAZOLIN SODIUM-DEXTROSE 2-3 GM-% IV SOLR
2.0000 g | Freq: Three times a day (TID) | INTRAVENOUS | Status: DC
  Administered 2012-12-10 – 2012-12-11 (×6): 2 g via INTRAVENOUS
  Filled 2012-12-10 (×8): qty 50

## 2012-12-10 MED ORDER — MIDAZOLAM HCL 2 MG/2ML IJ SOLN
INTRAMUSCULAR | Status: AC
  Filled 2012-12-10: qty 4

## 2012-12-10 MED ORDER — LORAZEPAM 2 MG/ML IJ SOLN
INTRAMUSCULAR | Status: AC | PRN
  Administered 2012-12-10: 1 mg via INTRAVENOUS

## 2012-12-10 MED ORDER — POTASSIUM CHLORIDE IN NACL 20-0.9 MEQ/L-% IV SOLN
INTRAVENOUS | Status: DC
  Administered 2012-12-10: 02:00:00 via INTRAVENOUS
  Filled 2012-12-10 (×3): qty 1000

## 2012-12-10 MED ORDER — ONDANSETRON HCL 4 MG/2ML IJ SOLN
4.0000 mg | Freq: Four times a day (QID) | INTRAMUSCULAR | Status: DC | PRN
  Administered 2012-12-10 – 2012-12-11 (×3): 4 mg via INTRAVENOUS
  Filled 2012-12-10 (×3): qty 2

## 2012-12-10 MED ORDER — MIDAZOLAM HCL 5 MG/5ML IJ SOLN
INTRAMUSCULAR | Status: AC | PRN
  Administered 2012-12-10: 1 mg via INTRAVENOUS

## 2012-12-10 MED ORDER — FENTANYL CITRATE 0.05 MG/ML IJ SOLN
INTRAMUSCULAR | Status: AC
  Filled 2012-12-10: qty 4

## 2012-12-10 MED ORDER — PANTOPRAZOLE SODIUM 40 MG IV SOLR
40.0000 mg | Freq: Every day | INTRAVENOUS | Status: DC
  Administered 2012-12-10 (×2): 40 mg via INTRAVENOUS
  Filled 2012-12-10 (×4): qty 40

## 2012-12-10 MED ORDER — POTASSIUM CL IN DEXTROSE 5% 20 MEQ/L IV SOLN
20.0000 meq | INTRAVENOUS | Status: DC
  Administered 2012-12-10 (×2): 20 meq via INTRAVENOUS
  Filled 2012-12-10 (×4): qty 1000

## 2012-12-10 MED ORDER — HYDROMORPHONE HCL PF 1 MG/ML IJ SOLN
1.0000 mg | INTRAMUSCULAR | Status: DC | PRN
  Administered 2012-12-10 – 2012-12-11 (×9): 1 mg via INTRAVENOUS
  Filled 2012-12-10 (×9): qty 1

## 2012-12-10 NOTE — Progress Notes (Signed)
Blood sugar at 1230 was 66.  Aris Georgia PA notified.  IV fluids changed to D5, and order received for Dextrose tabs PO.  Patient states she takes 1 dextrose tab at home.  1 Dextrose tab given at 1230.  Patient asymptomatic and ambulating to bathroom without difficulty.  CT tech notified of blood sugar and treatment.

## 2012-12-10 NOTE — H&P (Signed)
Nancy [name] is an 47 y.o. female.   Chief Complaint: Right lower quadrant abdominal pain HPI: This is a 47 yo female with multiple medical problems that underwent urgent ventral hernia repair for bowel obstruction/ incarcerated ventral hernia on 08/03/12.  This was repaired with Physiomesh underlay.  Post-operatively, she had issues with a subcutaneous seroma that required some aspiration.  She was started on antibiotics for a short course in early January.  She had several seroma aspirations, but was last seen 11/21/12.    Her initial abdominal operation was apparently a partial colectomy in [place], [place] with a temporary descending colostomy.  Subsequently, she had colostomy reversal, as well as a ventral hernia repair with mesh in [place].    She presents with several days of pain and burning in the right side of her abdomen near her incision.  She denies any fever, nausea, or vomiting.  Bowel movements have been normal.  The pain has become quite severe, so she presented to the ED for evaluation.    Past Medical History  Diagnosis Date  . Diabetes mellitus   . Back pain   . Thyroid disease   . Rosacea   . Ventral hernia   . Small bowel obstruction     Past Surgical History  Procedure Laterality Date  . Cholecystectomy    . Endometrial ablation    . Back surgery    . Colon surgery    . Colostomy    . Revision colostomy    . Ventral hernia repair  08/03/2012    Procedure: HERNIA REPAIR VENTRAL ADULT;  Surgeon: [name], MD;  Location: [facility] OR;  Service: General;  Laterality: N/A;  . Laparotomy  08/03/2012    Procedure: EXPLORATORY LAPAROTOMY;  Surgeon: [name], MD;  Location: [facility] OR;  Service: General;  Laterality: N/A;  with extensive enterolysis  . Insertion of mesh  08/03/2012    Procedure: INSERTION OF MESH;  Surgeon: [name], MD;  Location: [facility] OR;  Service: General;  Laterality: N/A;  . Carpatunnel surgery    . Liver biopsy      No family history on  file. Social History:  reports that she has never smoked. She does not have any smokeless tobacco history on file. She reports that she does not drink alcohol or use illicit drugs.  Allergies:  Allergies  Allergen Reactions  . Chocolate Other (See Comments)    rosacea   . Sulfa Antibiotics Nausea Only    Prior to Admission medications   Medication Sig Start Date End Date Taking? Authorizing Provider  aspirin 81 MG EC tablet Take 81 mg by mouth every evening.    Yes Historical Provider, MD  atorvastatin (LIPITOR) 10 MG tablet Take 10 mg by mouth at bedtime.    Yes Historical Provider, MD  Canagliflozin (INVOKANA) 300 MG TABS Take 300 mg by mouth every morning. Before breakfast   Yes Historical Provider, MD  Cholecalciferol 5000 UNITS capsule Take 5,000 Units by mouth every morning. Vitamin D   Yes Historical Provider, MD  doxycycline (VIBRA-TABS) 100 MG tablet Take 100 mg by mouth 2 (two) times daily as needed. For rosacea    Yes Historical Provider, MD  enalapril (VASOTEC) 5 MG tablet Take 5 mg by mouth at bedtime.     Yes Historical Provider, MD  insulin glargine (LANTUS SOLOSTAR) 100 UNIT/ML injection Inject 30 Units into the skin 2 (two) times daily.    Yes Historical Provider, MD  levothyroxine (SYNTHROID, LEVOTHROID)  150 MCG tablet Take 150 mcg by mouth daily before breakfast.   Yes Historical Provider, MD  Liraglutide (VICTOZA) 18 MG/3ML SOLN Inject 1.8 mg into the skin at bedtime.    Yes Historical Provider, MD  metFORMIN (GLUCOPHAGE) 1000 MG tablet Take 1,000 mg by mouth 2 (two) times daily.     Yes Historical Provider, MD  methocarbamol (ROBAXIN) 500 MG tablet Take 500 mg by mouth every 8 (eight) hours as needed. For muscle spasms   Yes Historical Provider, MD  metroNIDAZOLE (METROCREAM) 0.75 % cream Apply 1 application topically 2 (two) times daily as needed. For rosacea    Yes Historical Provider, MD    Results for orders placed during the hospital encounter of 12/09/12 (from  the past 48 hour(s))  CBC WITH DIFFERENTIAL     Status: None   Collection Time    12/09/12  6:13 PM      Result Value Range   WBC 8.9  4.0 - 10.5 K/uL   RBC 4.97  3.87 - 5.11 MIL/uL   Hemoglobin 14.5  12.0 - 15.0 g/dL   HCT 16.1  09.6 - 04.5 %   MCV 83.9  78.0 - 100.0 fL   MCH 29.2  26.0 - 34.0 pg   MCHC 34.8  30.0 - 36.0 g/dL   RDW 40.9  81.1 - 91.4 %   Platelets 276  150 - 400 K/uL   Neutrophils Relative 62  43 - 77 %   Neutro Abs 5.5  1.7 - 7.7 K/uL   Lymphocytes Relative 29  12 - 46 %   Lymphs Abs 2.6  0.7 - 4.0 K/uL   Monocytes Relative 8  3 - 12 %   Monocytes Absolute 0.7  0.1 - 1.0 K/uL   Eosinophils Relative 1  0 - 5 %   Eosinophils Absolute 0.1  0.0 - 0.7 K/uL   Basophils Relative 0  0 - 1 %   Basophils Absolute 0.0  0.0 - 0.1 K/uL  COMPREHENSIVE METABOLIC PANEL     Status: Abnormal   Collection Time    12/09/12  6:13 PM      Result Value Range   Sodium 139  135 - 145 mEq/L   Potassium 3.6  3.5 - 5.1 mEq/L   Chloride 103  96 - 112 mEq/L   CO2 25  19 - 32 mEq/L   Glucose, Bld 86  70 - 99 mg/dL   BUN 9  6 - 23 mg/dL   Creatinine, Ser 7.82 (*) 0.50 - 1.10 mg/dL   Calcium 9.4  8.4 - 95.6 mg/dL   Total Protein 7.5  6.0 - 8.3 g/dL   Albumin 4.1  3.5 - 5.2 g/dL   AST 20  0 - 37 U/L   ALT 17  0 - 35 U/L   Alkaline Phosphatase 57  39 - 117 U/L   Total Bilirubin 0.5  0.3 - 1.2 mg/dL   GFR calc non Af Amer >90  >90 mL/min   GFR calc Af Amer >90  >90 mL/min   Comment:            The eGFR has been calculated     using the CKD EPI equation.     This calculation has not been     validated in all clinical     situations.     eGFR's persistently     <90 mL/min signify     possible Chronic Kidney Disease.  LIPASE, BLOOD  Status: None   Collection Time    12/09/12  6:13 PM      Result Value Range   Lipase 47  11 - 59 U/L  URINALYSIS, MICROSCOPIC ONLY     Status: Abnormal   Collection Time    12/09/12  6:19 PM      Result Value Range   Color, Urine YELLOW  YELLOW    APPearance CLEAR  CLEAR   Specific Gravity, Urine 1.027  1.005 - 1.030   pH 5.5  5.0 - 8.0   Glucose, UA >1000 (*) NEGATIVE mg/dL   Hgb urine dipstick TRACE (*) NEGATIVE   Bilirubin Urine NEGATIVE  NEGATIVE   Ketones, ur 15 (*) NEGATIVE mg/dL   Protein, ur NEGATIVE  NEGATIVE mg/dL   Urobilinogen, UA 0.2  0.0 - 1.0 mg/dL   Nitrite NEGATIVE  NEGATIVE   Leukocytes, UA NEGATIVE  NEGATIVE   WBC, UA 0-2  <3 WBC/hpf   RBC / HPF 0-2  <3 RBC/hpf   Squamous Epithelial / LPF RARE  RARE   Ct Abdomen Pelvis W Contrast  12/09/2012  *RADIOLOGY REPORT*  Clinical Data: Abdominal pain  CT ABDOMEN AND PELVIS WITH CONTRAST  Technique:  Multidetector CT imaging of the abdomen and pelvis was performed following the standard protocol during bolus administration of intravenous contrast.  Contrast: OMNIPAQUE IOHEXOL 300 MG/ML  SOLN  Comparison: 08/03/2012  Findings: Limited images through the lung bases demonstrate no significant appreciable abnormality. The heart size is within normal limits. No pleural or pericardial effusion.  8 mm hypodensity within the right hepatic lobe on image 27 is nonspecific, unchanged.  Posterior to this, there is a hypodensity on image 25 that appears water density, most in keeping with a biliary cyst.  Focal hypodensity adjacent to the falciform is unchanged and favored to reflect focal fat or variant perfusion.  Absent gallbladder.  No biliary ductal dilatation.  Unremarkable spleen, pancreas, right adrenal gland.  Left adrenal nodule measuring 1.5 cm is indeterminate however without significant interval change.  There are subcentimeter hypodensities within the left kidney, incompletely characterized. Otherwise, symmetric renal enhancement. No hydronephrosis or hydroureter.  Sigmoid colonic anastomotic suture.  Colonic diverticulosis.  No CT evidence for colitis or diverticulitis.  Normal appendix.  Small bowel loops are normal course and caliber.  No free intraperitoneal air or  fluid.  Infraumbilical ventral subcutaneous fluid pocket measuring 4.2 x 2.5 cm.  Thickening/stranding of the adjacent subcutaneous fat.  No peritoneal communication.  No lymphadenopathy.  Normal caliber aorta and branch vessels.  Thin-walled bladder.  Unremarkable CT appearance to the uterus. Bilateral ovarian cysts and a more tubular cystic density left adnexa may reflect hydrosalpinx or a paraovarian cyst.  Multilevel degenerative changes of the imaged spine. No acute or aggressive appearing osseous lesion.  Postoperative changes of the lower lumbar spine with L4-5 fusion.  IMPRESSION:  2.5 x 4.2 cm fluid pocket within the infraumbilical ventral subcutaneous fat.  Infection not excluded.  Other unchanged findings as above.   Original Report Authenticated By: Jearld Lesch, M.D.     Review of Systems  Eyes: Negative.   Gastrointestinal: Positive for abdominal pain.  Neurological: Negative.   Endo/Heme/Allergies: Negative.     Blood pressure 119/81, pulse 79, temperature 97.7 F (36.5 C), temperature source [place], resp. rate 18, SpO2 97.00%. Physical Exam  WDWN in NAD HEENT:  EOMI, sclera anicteric Neck:  No masses, no thyromegaly Lungs:  CTA bilaterally; normal respiratory effort CV:  Regular rate and rhythm; no  murmurs Abd:  +bowel sounds, obese; well-healed midline incision; no palpable hernia; no erythema or drainage On the right side of the lower half of the incision, there is some deep firmness and tenderness that is palpable.  This is the localized area of her tenderness. Ext:  Well-perfused; no edema Skin:  Warm, dry; no sign of jaundice  Assessment/Plan Inflammation/ 2.5 x 4.2 cm fluid collection in the lower subcutaneous pannus - possible infected seroma  Plan:  Admit overnight for pain control/ IV antibiotics May need image-guided aspiration or incision/ drainage of fluid collection Will keep patient NPO overnight.  Hashem Goynes K. 12/10/2012, 12:11 AM

## 2012-12-10 NOTE — Progress Notes (Signed)
Subjective: Pt resting comfortably in bed.  Complains of constant RLQ pain.  Improves with pain medication.  Denies nausea, vomiting or diarrhea.  Denies fever, chills or sweats.  Voiding without any difficulties.    Objective: Vital signs in last 24 hours: Temp:  [97.3 F (36.3 C)-98.4 F (36.9 C)] 97.3 F (36.3 C) (04/22 0556) Pulse Rate:  [66-82] 66 (04/22 0556) Resp:  [18] 18 (04/22 0556) BP: (107-140)/(66-85) 107/66 mmHg (04/22 0556) SpO2:  [97 %-99 %] 98 % (04/22 0556) Weight:  [213 lb 3 oz (96.7 kg)] 213 lb 3 oz (96.7 kg) (04/22 0125) Last BM Date: 12/09/12  Intake/Output from previous day: 04/21 0701 - 04/22 0700 In: 500 [I.V.:500] Out: -  Intake/Output this shift:    General appearance: alert, cooperative and no distress Resp: clear to auscultation bilaterally Cardio: regular rate and rhythm, S1, S2 normal, no murmur, click, rub or gallop GI: obese, non distended, +BS x4 quadrants.  Well healed midline incision, RLQ tenderness, firmness.  No hernias.  No organomegaly. Pulses: 2+ and symmetric  Lab Results:   Recent Labs  12/09/12 1813  WBC 8.9  HGB 14.5  HCT 41.7  PLT 276   BMET  Recent Labs  12/09/12 1813  NA 139  K 3.6  CL 103  CO2 25  GLUCOSE 86  BUN 9  CREATININE 0.46*  CALCIUM 9.4    Studies/Results: Ct Abdomen Pelvis W Contrast  12/09/2012  *RADIOLOGY REPORT*  Clinical Data: Abdominal pain  CT ABDOMEN AND PELVIS WITH CONTRAST  Technique:  Multidetector CT imaging of the abdomen and pelvis was performed following the standard protocol during bolus administration of intravenous contrast.  Contrast: OMNIPAQUE IOHEXOL 300 MG/ML  SOLN  Comparison: 08/03/2012  Findings: Limited images through the lung bases demonstrate no significant appreciable abnormality. The heart size is within normal limits. No pleural or pericardial effusion.  8 mm hypodensity within the right hepatic lobe on image 27 is nonspecific, unchanged.  Posterior to this,  there is a hypodensity on image 25 that appears water density, most in keeping with a biliary cyst.  Focal hypodensity adjacent to the falciform is unchanged and favored to reflect focal fat or variant perfusion.  Absent gallbladder.  No biliary ductal dilatation.  Unremarkable spleen, pancreas, right adrenal gland.  Left adrenal nodule measuring 1.5 cm is indeterminate however without significant interval change.  There are subcentimeter hypodensities within the left kidney, incompletely characterized. Otherwise, symmetric renal enhancement. No hydronephrosis or hydroureter.  Sigmoid colonic anastomotic suture.  Colonic diverticulosis.  No CT evidence for colitis or diverticulitis.  Normal appendix.  Small bowel loops are normal course and caliber.  No free intraperitoneal air or fluid.  Infraumbilical ventral subcutaneous fluid pocket measuring 4.2 x 2.5 cm.  Thickening/stranding of the adjacent subcutaneous fat.  No peritoneal communication.  No lymphadenopathy.  Normal caliber aorta and branch vessels.  Thin-walled bladder.  Unremarkable CT appearance to the uterus. Bilateral ovarian cysts and a more tubular cystic density left adnexa may reflect hydrosalpinx or a paraovarian cyst.  Multilevel degenerative changes of the imaged spine. No acute or aggressive appearing osseous lesion.  Postoperative changes of the lower lumbar spine with L4-5 fusion.  IMPRESSION:  2.5 x 4.2 cm fluid pocket within the infraumbilical ventral subcutaneous fat.  Infection not excluded.  Other unchanged findings as above.   Original Report Authenticated By: Jearld Lesch, M.D.     Anti-infectives: Anti-infectives   Start     Dose/Rate Route Frequency Ordered Stop  12/10/12 0145  ceFAZolin (ANCEF) IVPB 2 g/50 mL premix     2 g 100 mL/hr over 30 Minutes Intravenous 3 times per day 12/10/12 0134        Assessment/Plan: 47 year old female s/p incarcerated ventral hernia repear 12/13 complicated with seroma postoperatively.   She has a history of partial colectomy with temporary descending colostomy with subsequent reversal, diabetes mellitus, hypertension, hyperlipidemia, hypothyroidism, cholecystectomy and endometrial ablation.  RLQ subcutaneous fluid collection -cefazolin -NPO for procedure -schedule for IR guided drainage of collection.  Obtain C&S. -Pain control -CBC in am  VTE Prophylaxis -ambulate in hallways -SCD   Diabetes Mellitus -BG 70-90s -BG monitoring -Sliding scale   LOS: 1 day    Merrissa Giacobbe, [facility] 12/10/2012

## 2012-12-10 NOTE — ED Notes (Signed)
General Surgery in room with pt

## 2012-12-10 NOTE — Progress Notes (Signed)
Patient had a percutaneous drain placed for a very small amount of fluid.  Will try to get that out as soon as possible because of the risk of infecting the mesh which is in place.  If the output is down tomorrow, will consider removing the drain.  Marta Lamas. Gae Bon, MD, FACS [phone] [facility] Surgery

## 2012-12-10 NOTE — Progress Notes (Signed)
Hypoglycemic Event  CBG: 66  Treatment:1 glucose tabe  Symptoms: None  Follow-up CBG: Time1300 CBG Result:72  Possible Reasons for Event: Inadequate meal intake  Comments/MD notified:Megan Dort PA    Nancy [name]  Remember to initiate Hypoglycemia Order Set & complete

## 2012-12-10 NOTE — H&P (Signed)
Nancy [name] is an 47 y.o. female.   Chief Complaint: Ventral hernia repair 08/03/12 Has had development of seroma at site and aspirated In office 3-4 times; last asp 11/21/12 New onset pain; abd pain; fever CT reveals periumbilical collection Scheduled for aspiration/drain placement today HPI: DM; thyroid dz; SBO/ventral hernia  Past Medical History  Diagnosis Date  . Diabetes mellitus   . Back pain   . Thyroid disease   . Rosacea   . Ventral hernia   . Small bowel obstruction     Past Surgical History  Procedure Laterality Date  . Cholecystectomy    . Endometrial ablation    . Back surgery    . Colon surgery    . Colostomy    . Revision colostomy    . Ventral hernia repair  08/03/2012    Procedure: HERNIA REPAIR VENTRAL ADULT;  Surgeon: [name], MD;  Location: [facility] OR;  Service: General;  Laterality: N/A;  . Laparotomy  08/03/2012    Procedure: EXPLORATORY LAPAROTOMY;  Surgeon: [name], MD;  Location: [facility] OR;  Service: General;  Laterality: N/A;  with extensive enterolysis  . Insertion of mesh  08/03/2012    Procedure: INSERTION OF MESH;  Surgeon: [name], MD;  Location: [facility] OR;  Service: General;  Laterality: N/A;  . Carpatunnel surgery    . Liver biopsy      History reviewed. No pertinent family history. Social History:  reports that she has never smoked. She does not have any smokeless tobacco history on file. She reports that she does not drink alcohol or use illicit drugs.  Allergies:  Allergies  Allergen Reactions  . Chocolate Other (See Comments)    rosacea   . Sulfa Antibiotics Nausea Only    Medications Prior to Admission  Medication Sig Dispense Refill  . aspirin 81 MG EC tablet Take 81 mg by mouth every evening.       Marland Kitchen atorvastatin (LIPITOR) 10 MG tablet Take 10 mg by mouth at bedtime.       . Canagliflozin (INVOKANA) 300 MG TABS Take 300 mg by mouth every morning. Before breakfast      . Cholecalciferol 5000 UNITS capsule Take 5,000  Units by mouth every morning. Vitamin D      . doxycycline (VIBRA-TABS) 100 MG tablet Take 100 mg by mouth 2 (two) times daily as needed. For rosacea       . enalapril (VASOTEC) 5 MG tablet Take 5 mg by mouth at bedtime.        . insulin glargine (LANTUS SOLOSTAR) 100 UNIT/ML injection Inject 30 Units into the skin 2 (two) times daily.       Marland Kitchen levothyroxine (SYNTHROID, LEVOTHROID) 150 MCG tablet Take 150 mcg by mouth daily before breakfast.      . Liraglutide (VICTOZA) 18 MG/3ML SOLN Inject 1.8 mg into the skin at bedtime.       . metFORMIN (GLUCOPHAGE) 1000 MG tablet Take 1,000 mg by mouth 2 (two) times daily.        . methocarbamol (ROBAXIN) 500 MG tablet Take 500 mg by mouth every 8 (eight) hours as needed. For muscle spasms      . metroNIDAZOLE (METROCREAM) 0.75 % cream Apply 1 application topically 2 (two) times daily as needed. For rosacea         Results for orders placed during the hospital encounter of 12/09/12 (from the past 48 hour(s))  CBC WITH DIFFERENTIAL     Status: None  Collection Time    12/09/12  6:13 PM      Result Value Range   WBC 8.9  4.0 - 10.5 K/uL   RBC 4.97  3.87 - 5.11 MIL/uL   Hemoglobin 14.5  12.0 - 15.0 g/dL   HCT 16.1  09.6 - 04.5 %   MCV 83.9  78.0 - 100.0 fL   MCH 29.2  26.0 - 34.0 pg   MCHC 34.8  30.0 - 36.0 g/dL   RDW 40.9  81.1 - 91.4 %   Platelets 276  150 - 400 K/uL   Neutrophils Relative 62  43 - 77 %   Neutro Abs 5.5  1.7 - 7.7 K/uL   Lymphocytes Relative 29  12 - 46 %   Lymphs Abs 2.6  0.7 - 4.0 K/uL   Monocytes Relative 8  3 - 12 %   Monocytes Absolute 0.7  0.1 - 1.0 K/uL   Eosinophils Relative 1  0 - 5 %   Eosinophils Absolute 0.1  0.0 - 0.7 K/uL   Basophils Relative 0  0 - 1 %   Basophils Absolute 0.0  0.0 - 0.1 K/uL  COMPREHENSIVE METABOLIC PANEL     Status: Abnormal   Collection Time    12/09/12  6:13 PM      Result Value Range   Sodium 139  135 - 145 mEq/L   Potassium 3.6  3.5 - 5.1 mEq/L   Chloride 103  96 - 112 mEq/L   CO2  25  19 - 32 mEq/L   Glucose, Bld 86  70 - 99 mg/dL   BUN 9  6 - 23 mg/dL   Creatinine, Ser 7.82 (*) 0.50 - 1.10 mg/dL   Calcium 9.4  8.4 - 95.6 mg/dL   Total Protein 7.5  6.0 - 8.3 g/dL   Albumin 4.1  3.5 - 5.2 g/dL   AST 20  0 - 37 U/L   ALT 17  0 - 35 U/L   Alkaline Phosphatase 57  39 - 117 U/L   Total Bilirubin 0.5  0.3 - 1.2 mg/dL   GFR calc non Af Amer >90  >90 mL/min   GFR calc Af Amer >90  >90 mL/min   Comment:            The eGFR has been calculated     using the CKD EPI equation.     This calculation has not been     validated in all clinical     situations.     eGFR's persistently     <90 mL/min signify     possible Chronic Kidney Disease.  LIPASE, BLOOD     Status: None   Collection Time    12/09/12  6:13 PM      Result Value Range   Lipase 47  11 - 59 U/L  URINALYSIS, MICROSCOPIC ONLY     Status: Abnormal   Collection Time    12/09/12  6:19 PM      Result Value Range   Color, Urine YELLOW  YELLOW   APPearance CLEAR  CLEAR   Specific Gravity, Urine 1.027  1.005 - 1.030   pH 5.5  5.0 - 8.0   Glucose, UA >1000 (*) NEGATIVE mg/dL   Hgb urine dipstick TRACE (*) NEGATIVE   Bilirubin Urine NEGATIVE  NEGATIVE   Ketones, ur 15 (*) NEGATIVE mg/dL   Protein, ur NEGATIVE  NEGATIVE mg/dL   Urobilinogen, UA 0.2  0.0 - 1.0 mg/dL   Nitrite  NEGATIVE  NEGATIVE   Leukocytes, UA NEGATIVE  NEGATIVE   WBC, UA 0-2  <3 WBC/hpf   RBC / HPF 0-2  <3 RBC/hpf   Squamous Epithelial / LPF RARE  RARE  GLUCOSE, CAPILLARY     Status: None   Collection Time    12/10/12  1:36 AM      Result Value Range   Glucose-Capillary 75  70 - 99 mg/dL  GLUCOSE, CAPILLARY     Status: None   Collection Time    12/10/12  3:43 AM      Result Value Range   Glucose-Capillary 79  70 - 99 mg/dL  GLUCOSE, CAPILLARY     Status: None   Collection Time    12/10/12  7:43 AM      Result Value Range   Glucose-Capillary 93  70 - 99 mg/dL   Comment 1 Notify RN     Ct Abdomen Pelvis W Contrast  12/09/2012   *RADIOLOGY REPORT*  Clinical Data: Abdominal pain  CT ABDOMEN AND PELVIS WITH CONTRAST  Technique:  Multidetector CT imaging of the abdomen and pelvis was performed following the standard protocol during bolus administration of intravenous contrast.  Contrast: OMNIPAQUE IOHEXOL 300 MG/ML  SOLN  Comparison: 08/03/2012  Findings: Limited images through the lung bases demonstrate no significant appreciable abnormality. The heart size is within normal limits. No pleural or pericardial effusion.  8 mm hypodensity within the right hepatic lobe on image 27 is nonspecific, unchanged.  Posterior to this, there is a hypodensity on image 25 that appears water density, most in keeping with a biliary cyst.  Focal hypodensity adjacent to the falciform is unchanged and favored to reflect focal fat or variant perfusion.  Absent gallbladder.  No biliary ductal dilatation.  Unremarkable spleen, pancreas, right adrenal gland.  Left adrenal nodule measuring 1.5 cm is indeterminate however without significant interval change.  There are subcentimeter hypodensities within the left kidney, incompletely characterized. Otherwise, symmetric renal enhancement. No hydronephrosis or hydroureter.  Sigmoid colonic anastomotic suture.  Colonic diverticulosis.  No CT evidence for colitis or diverticulitis.  Normal appendix.  Small bowel loops are normal course and caliber.  No free intraperitoneal air or fluid.  Infraumbilical ventral subcutaneous fluid pocket measuring 4.2 x 2.5 cm.  Thickening/stranding of the adjacent subcutaneous fat.  No peritoneal communication.  No lymphadenopathy.  Normal caliber aorta and branch vessels.  Thin-walled bladder.  Unremarkable CT appearance to the uterus. Bilateral ovarian cysts and a more tubular cystic density left adnexa may reflect hydrosalpinx or a paraovarian cyst.  Multilevel degenerative changes of the imaged spine. No acute or aggressive appearing osseous lesion.  Postoperative changes of the  lower lumbar spine with L4-5 fusion.  IMPRESSION:  2.5 x 4.2 cm fluid pocket within the infraumbilical ventral subcutaneous fat.  Infection not excluded.  Other unchanged findings as above.   Original Report Authenticated By: Jearld Lesch, M.D.     Review of Systems  Constitutional: Negative for fever.  Respiratory: Negative for shortness of breath.   Cardiovascular: Negative for chest pain.  Gastrointestinal: Positive for abdominal pain. Negative for vomiting.  Neurological: Negative for headaches.    Blood pressure 107/66, pulse 66, temperature 97.3 F (36.3 C), temperature source Oral, resp. rate 18, height 5\' 6"  (1.676 m), weight 213 lb 3 oz (96.7 kg), SpO2 98.00%. Physical Exam  Constitutional: She is oriented to person, place, and time. She appears well-nourished.  Cardiovascular: Normal rate, regular rhythm and normal heart sounds.  No murmur heard. Respiratory: Effort normal and breath sounds normal. She has no wheezes.  GI: Soft. Bowel sounds are normal. There is tenderness.  Musculoskeletal: Normal range of motion.  Neurological: She is alert and oriented to person, place, and time.  Psychiatric: She has a normal mood and affect. Her behavior is normal. Judgment and thought content normal.     Assessment/Plan Recurrent seroma at site of ventral hernia repair Now with new abd pain and fever CT shows periumbilical collection Scheduled for asp/drain placement Pt aware of procedure benefits and risks and agreeable to proceed Consent signed and in chart  Gadiel John A 12/10/2012, 8:57 AM

## 2012-12-10 NOTE — Procedures (Signed)
Technically successful CT guided drainage catheter placement into supraumbilical ant abdominal wall yielding 15 cc of serous, brown colored fluid.  Samples sent to laboratory.  No immediate post procedural complications.

## 2012-12-10 NOTE — Progress Notes (Signed)
Hypoglycemic Event  CBG: 69  Treatment: D5 at 100/hr started  Symptoms: None  Follow-up CBG: Time:1215 CBG Result:66  Possible Reasons for Event: NPO for procedure  Comments/MD notified:Megan Dort PA notified    Nancy [name]  Remember to initiate Hypoglycemia Order Set & complete

## 2012-12-11 ENCOUNTER — Encounter (HOSPITAL_COMMUNITY): Payer: Medicare Other

## 2012-12-11 DIAGNOSIS — M793 Panniculitis, unspecified: Secondary | ICD-10-CM | POA: Diagnosis not present

## 2012-12-11 DIAGNOSIS — R1031 Right lower quadrant pain: Secondary | ICD-10-CM | POA: Diagnosis not present

## 2012-12-11 LAB — CBC
HCT: 43.4 % (ref 36.0–46.0)
MCH: 29 pg (ref 26.0–34.0)
MCV: 84.6 fL (ref 78.0–100.0)
Platelets: 243 10*3/uL (ref 150–400)
RBC: 5.13 MIL/uL — ABNORMAL HIGH (ref 3.87–5.11)

## 2012-12-11 LAB — GLUCOSE, CAPILLARY
Glucose-Capillary: 125 mg/dL — ABNORMAL HIGH (ref 70–99)
Glucose-Capillary: 125 mg/dL — ABNORMAL HIGH (ref 70–99)
Glucose-Capillary: 110 mg/dL — ABNORMAL HIGH (ref 70–99)

## 2012-12-11 MED ORDER — METFORMIN HCL 1000 MG PO TABS: 1000.0000 mg | ORAL_TABLET | Freq: Two times a day (BID) | ORAL | Status: AC

## 2012-12-11 MED ORDER — CEPHALEXIN 500 MG PO CAPS: 500.0000 mg | ORAL_CAPSULE | Freq: Four times a day (QID) | ORAL | Status: AC

## 2012-12-11 MED ORDER — PANTOPRAZOLE SODIUM 40 MG PO TBEC
40.0000 mg | DELAYED_RELEASE_TABLET | Freq: Every day | ORAL | Status: DC
  Administered 2012-12-11: 40 mg via ORAL
  Filled 2012-12-11: qty 1

## 2012-12-11 MED ORDER — HYDROCODONE-ACETAMINOPHEN 5-325 MG PO TABS: 1.0000 | ORAL_TABLET | Freq: Four times a day (QID) | ORAL | Status: DC | PRN

## 2012-12-11 NOTE — Progress Notes (Signed)
Subjective: Pt reports feeling much better today.  The RLQ pain is a pulling faint pain, but nothing like upon admission.  Denies fever or chills.  She became nauseated this morning, no vomiting and alleviated with anti-emetics.  Voiding without difficulties, last bm 4/21.  Tolerating oral intake well.  Objective: Vital signs in last 24 hours: Temp:  [97.5 F (36.4 C)-98.2 F (36.8 C)] 97.9 F (36.6 C) (04/23 4540) Pulse Rate:  [64-98] 66 (04/23 0608) Resp:  [11-21] 18 (04/23 9811) BP: (98-121)/(46-70) 103/46 mmHg (04/23 0608) SpO2:  [94 %-100 %] 100 % (04/23 9147) Last BM Date: 12/09/12  Intake/Output from previous day: 04/22 0701 - 04/23 0700 In: 2266.7 [I.V.:2251.7] Out: 12 [Drains:12] Intake/Output this shift:    General appearance: alert, cooperative and no distress Resp: clear to auscultation bilaterally Cardio: regular rate and rhythm, S1, S2 normal, no murmur, click, rub or gallop GI: soft, non tender, +BS x4 quadrants.  No masses, hernias or organomegaly.  LLQ perc drain with minimal serous output Extremities: extremities normal, atraumatic, no cyanosis or edema, no edema, redness or tenderness in the calves or thighs and no ulcers, gangrene or trophic changes  Lab Results:   Recent Labs  12/09/12 1813 12/11/12 0622  WBC 8.9 8.4  HGB 14.5 14.9  HCT 41.7 43.4  PLT 276 243   BMET  Recent Labs  12/09/12 1813  NA 139  K 3.6  CL 103  CO2 25  GLUCOSE 86  BUN 9  CREATININE 0.46*  CALCIUM 9.4   PT/INR  Recent Labs  12/10/12 0839  LABPROT 14.3  INR 1.13   ABG No results found for this basename: PHART, PCO2, PO2, HCO3,  in the last 72 hours  Studies/Results: Ct Abdomen Pelvis W Contrast  12/09/2012  *RADIOLOGY REPORT*  Clinical Data: Abdominal pain  CT ABDOMEN AND PELVIS WITH CONTRAST  Technique:  Multidetector CT imaging of the abdomen and pelvis was performed following the standard protocol during bolus administration of intravenous contrast.   Contrast: OMNIPAQUE IOHEXOL 300 MG/ML  SOLN  Comparison: 08/03/2012  Findings: Limited images through the lung bases demonstrate no significant appreciable abnormality. The heart size is within normal limits. No pleural or pericardial effusion.  8 mm hypodensity within the right hepatic lobe on image 27 is nonspecific, unchanged.  Posterior to this, there is a hypodensity on image 25 that appears water density, most in keeping with a biliary cyst.  Focal hypodensity adjacent to the falciform is unchanged and favored to reflect focal fat or variant perfusion.  Absent gallbladder.  No biliary ductal dilatation.  Unremarkable spleen, pancreas, right adrenal gland.  Left adrenal nodule measuring 1.5 cm is indeterminate however without significant interval change.  There are subcentimeter hypodensities within the left kidney, incompletely characterized. Otherwise, symmetric renal enhancement. No hydronephrosis or hydroureter.  Sigmoid colonic anastomotic suture.  Colonic diverticulosis.  No CT evidence for colitis or diverticulitis.  Normal appendix.  Small bowel loops are normal course and caliber.  No free intraperitoneal air or fluid.  Infraumbilical ventral subcutaneous fluid pocket measuring 4.2 x 2.5 cm.  Thickening/stranding of the adjacent subcutaneous fat.  No peritoneal communication.  No lymphadenopathy.  Normal caliber aorta and branch vessels.  Thin-walled bladder.  Unremarkable CT appearance to the uterus. Bilateral ovarian cysts and a more tubular cystic density left adnexa may reflect hydrosalpinx or a paraovarian cyst.  Multilevel degenerative changes of the imaged spine. No acute or aggressive appearing osseous lesion.  Postoperative changes of the lower lumbar spine  with L4-5 fusion.  IMPRESSION:  2.5 x 4.2 cm fluid pocket within the infraumbilical ventral subcutaneous fat.  Infection not excluded.  Other unchanged findings as above.   Original Report Authenticated By: Jearld Lesch, M.D.     Ct Image Guided Fluid Drain By Catheter  12/10/2012  *RADIOLOGY REPORT*  Indication: History of ventral hernia repair, now with abdominal pain and fever.  Recent CT scan demonstrates indeterminate fluid collection within the infra umbilical ventral subcutaneous fat.  CT GUIDED VENTRAL ABDOMINAL WALL DRAINAGE CATHETER PLACEMENT  Comparison: CT abdomen pelvis - 12/10/2038  Medications: Fentanyl 75 mcg IV; Versed 25 mg IV  Total Moderate Sedation time: 24 minutes  Contrast: None  Complications: None immediate  Technique / Findings:  Informed written consent was obtained from the patient after a discussion of the risks, benefits and alternatives to treatment. The patient was placed supine on the CT gantry and a pre procedural CT was performed re-demonstrating the known abscess/fluid collection within the ventral soft tissues of the midline of the abdomen, measuring approximately 4.1 x 2.4 cm in diameter (image 21, series one).  The procedure was planned.   A timeout was performed prior to the initiation of the procedure.  The skin overlying the left lower abdominal quadrant was prepped and draped in the usual sterile fashion.   The overlying soft tissues were anesthetized with 1% lidocaine with epinephrine. Appropriate trajectory was planned with the use of a 22 gauge spinal needle.  An 18 gauge trocar needle was advanced into the abscess/fluid collection and a short Amplatz super stiff wire was coiled within the collection.   Appropriate positioning was confirmed with a limited CT scan.  The tract was serially dilated however there is difficulty placing the 10-French all-purpose drainage catheter   with the catheter coiled external to the fluid collection.  As such, the fluid collection again accessed with a 5-French Yueh sheath needle.  An Amplatz wire was coiled within the collection. Appropriate positioning was confirmed with a limited CT can.  This allowed for placement of an 8-French all-purpose drainage  catheter which was coiled within the collection.  Appropriate positioning was confirmed with a limited postprocedural CT scan.  15 ml of reddish/brown serous fluid was aspirated. The aspirated fluid was capped and sent to the laboratory for analysis.  The tube was connected to a drainage bag and sutured in place.  A dressing was placed.  The patient tolerated the procedure well without immediate post procedural complication.  Impression:  Successful CT guided placement of a 8 [place] all purpose drain catheter into the ventral abdominal wall fluid collection with aspiration of 15 mL of reddish/brown serous fluid.  Samples were sent to the laboratory as requested by the ordering clinical team.   Original Report Authenticated By: Tacey Ruiz, MD     Anti-infectives: Anti-infectives   Start     Dose/Rate Route Frequency Ordered Stop   12/10/12 0145  ceFAZolin (ANCEF) IVPB 2 g/50 mL premix     2 g 100 mL/hr over 30 Minutes Intravenous 3 times per day 12/10/12 0134        Assessment/Plan: 48 year old female s/p incarcerated ventral hernia repear 12/13 complicated with seroma postoperatively. She has a history of partial colectomy with temporary descending colostomy with subsequent reversal, diabetes mellitus, hypertension, hyperlipidemia, hypothyroidism, cholecystectomy and endometrial ablation.   RLQ subcutaneous fluid collection  -improving -cefazolin  -IR drain placement 4/22.  15ml of serous, brown colored fluid drained, since she has only  had 12ml out.  She is at high risk of infection because of the mesh, remove drain today.  Ralene Muskrat PA-C was notified. -Normal CBC this morning -Culture is pending, no growth to date. -Discontinue IVF -Pain control   VTE Prophylaxis  -ambulate in hallways  -SCD  Diabetes Mellitus  -Diabetic diet -BG monitoring  -Sliding scale    LOS: 2 days    Nancy [name], [facility] 12/11/2012

## 2012-12-11 NOTE — Progress Notes (Signed)
Discharge instructions gone over with patient. Home medications reviewed. Prescription given and one faxed to patient's pharmacy. Follow up appointment has been made. Diet, activity, and incisional care gone over. Signs and symptoms of infection and or worsening condition discussed. My chart instructions gone over. Patient verbalized understanding of instructions and had no questions at this time.

## 2012-12-11 NOTE — Progress Notes (Signed)
Subjective: Pt feeling better  Objective: Physical Exam: BP 103/46  Pulse 66  Temp(Src) 97.9 F (36.6 C) (Oral)  Resp 18  Ht 5\' 6"  (1.676 m)  Wt 213 lb 3 oz (96.7 kg)  BMI 34.43 kg/m2  SpO2 100% LLQ drain intact, site clean. Minimal amount of serosanguinous output. Drain removed at request of surgical team, no complications.   Labs: CBC  Recent Labs  12/09/12 1813 12/11/12 0622  WBC 8.9 8.4  HGB 14.5 14.9  HCT 41.7 43.4  PLT 276 243   BMET  Recent Labs  12/09/12 1813  NA 139  K 3.6  CL 103  CO2 25  GLUCOSE 86  BUN 9  CREATININE 0.46*  CALCIUM 9.4   LFT  Recent Labs  12/09/12 1813  PROT 7.5  ALBUMIN 4.1  AST 20  ALT 17  ALKPHOS 57  BILITOT 0.5  LIPASE 47   PT/INR  Recent Labs  12/10/12 0839  LABPROT 14.3  INR 1.13     Studies/Results: Ct Abdomen Pelvis W Contrast  12/09/2012  *RADIOLOGY REPORT*  Clinical Data: Abdominal pain  CT ABDOMEN AND PELVIS WITH CONTRAST  Technique:  Multidetector CT imaging of the abdomen and pelvis was performed following the standard protocol during bolus administration of intravenous contrast.  Contrast: OMNIPAQUE IOHEXOL 300 MG/ML  SOLN  Comparison: 08/03/2012  Findings: Limited images through the lung bases demonstrate no significant appreciable abnormality. The heart size is within normal limits. No pleural or pericardial effusion.  8 mm hypodensity within the right hepatic lobe on image 27 is nonspecific, unchanged.  Posterior to this, there is a hypodensity on image 25 that appears water density, most in keeping with a biliary cyst.  Focal hypodensity adjacent to the falciform is unchanged and favored to reflect focal fat or variant perfusion.  Absent gallbladder.  No biliary ductal dilatation.  Unremarkable spleen, pancreas, right adrenal gland.  Left adrenal nodule measuring 1.5 cm is indeterminate however without significant interval change.  There are subcentimeter hypodensities within the left kidney,  incompletely characterized. Otherwise, symmetric renal enhancement. No hydronephrosis or hydroureter.  Sigmoid colonic anastomotic suture.  Colonic diverticulosis.  No CT evidence for colitis or diverticulitis.  Normal appendix.  Small bowel loops are normal course and caliber.  No free intraperitoneal air or fluid.  Infraumbilical ventral subcutaneous fluid pocket measuring 4.2 x 2.5 cm.  Thickening/stranding of the adjacent subcutaneous fat.  No peritoneal communication.  No lymphadenopathy.  Normal caliber aorta and branch vessels.  Thin-walled bladder.  Unremarkable CT appearance to the uterus. Bilateral ovarian cysts and a more tubular cystic density left adnexa may reflect hydrosalpinx or a paraovarian cyst.  Multilevel degenerative changes of the imaged spine. No acute or aggressive appearing osseous lesion.  Postoperative changes of the lower lumbar spine with L4-5 fusion.  IMPRESSION:  2.5 x 4.2 cm fluid pocket within the infraumbilical ventral subcutaneous fat.  Infection not excluded.  Other unchanged findings as above.   Original Report Authenticated By: Jearld Lesch, M.D.    Ct Image Guided Fluid Drain By Catheter  12/10/2012  *RADIOLOGY REPORT*  Indication: History of ventral hernia repair, now with abdominal pain and fever.  Recent CT scan demonstrates indeterminate fluid collection within the infra umbilical ventral subcutaneous fat.  CT GUIDED VENTRAL ABDOMINAL WALL DRAINAGE CATHETER PLACEMENT  Comparison: CT abdomen pelvis - 12/10/2038  Medications: Fentanyl 75 mcg IV; Versed 25 mg IV  Total Moderate Sedation time: 24 minutes  Contrast: None  Complications: None immediate  Technique / Findings:  Informed written consent was obtained from the patient after a discussion of the risks, benefits and alternatives to treatment. The patient was placed supine on the CT gantry and a pre procedural CT was performed re-demonstrating the known abscess/fluid collection within the ventral soft tissues of  the midline of the abdomen, measuring approximately 4.1 x 2.4 cm in diameter (image 21, series one).  The procedure was planned.   A timeout was performed prior to the initiation of the procedure.  The skin overlying the left lower abdominal quadrant was prepped and draped in the usual sterile fashion.   The overlying soft tissues were anesthetized with 1% lidocaine with epinephrine. Appropriate trajectory was planned with the use of a 22 gauge spinal needle.  An 18 gauge trocar needle was advanced into the abscess/fluid collection and a short Amplatz super stiff wire was coiled within the collection.   Appropriate positioning was confirmed with a limited CT scan.  The tract was serially dilated however there is difficulty placing the 10-French all-purpose drainage catheter   with the catheter coiled external to the fluid collection.  As such, the fluid collection again accessed with a 5-French Yueh sheath needle.  An Amplatz wire was coiled within the collection. Appropriate positioning was confirmed with a limited CT can.  This allowed for placement of an 8-French all-purpose drainage catheter which was coiled within the collection.  Appropriate positioning was confirmed with a limited postprocedural CT scan.  15 ml of reddish/brown serous fluid was aspirated. The aspirated fluid was capped and sent to the laboratory for analysis.  The tube was connected to a drainage bag and sutured in place.  A dressing was placed.  The patient tolerated the procedure well without immediate post procedural complication.  Impression:  Successful CT guided placement of a 8 [place] all purpose drain catheter into the ventral abdominal wall fluid collection with aspiration of 15 mL of reddish/brown serous fluid.  Samples were sent to the laboratory as requested by the ordering clinical team.   Original Report Authenticated By: Tacey Ruiz, MD     Assessment/Plan: S/p perc drain LLQ seroma, removed today. Call IR is further help  needed.    LOS: 2 days    Brayton El PA-C 12/11/2012 9:23 AM

## 2012-12-11 NOTE — Discharge Summary (Signed)
Physician Discharge Summary  Nancy [name] JWJ:[id number] DOB: 1966/07/12 DOA: 12/09/2012  PCP: Carylon Perches, MD  Consultation: Interventional Radiology  Admit date: 12/09/2012 Discharge date: 12/11/2012  Recommendations for Outpatient Follow-up:   Follow-up Information   Follow up with WYATT, Marta Lamas, MD. Schedule an appointment as soon as possible for a visit on 01/06/2013. (Your appointment is scheduled at 2:15.  Please be sure to arrive at 2pm.)    Contact information:   [address]  [facility] [place] [address] [phone]      Discharge Diagnoses:  1. RLQ Abdominal pain 2. Subcutaneous fluid collection  Procedures: CT guided drainage catheter placement  Discharge Condition: stable   Diet recommendation: diabetic diet  Filed Weights   12/10/12 0125  Weight: 213 lb 3 oz (96.7 kg)       Hospital Course:  47 year old female s/p incarcerated ventral hernia repear 12/13 complicated with seroma postoperatively. She has a history of partial colectomy with temporary descending colostomy with subsequent reversal, diabetes mellitus, hypertension, hyperlipidemia, hypothyroidism, cholecystectomy and endometrial ablation.  She presented to ED with severe RLQ abdominal pain.  CT revealed  RLQ subq fluid collection. She underwent a CT guided tube placement at which yielded 15cc of serous, brown fluid.  The following day the drain was discontinued due to low output and concerns for infection with history of mesh placement.  Her vital signs remained stable.  Laboratory evaluation unremarkable.  Pt was on sliding scale insulin and D5 when NPO to prevent hypoglycemia, given glucagon tablet x1 and no further hypoglycemia occurred.  She remained afebrile.  Her pain improved and she was ambulating in hallways, eating and voiding without difficulties.  She was maintained on IV cefazolin.  With considerable improvement in symptoms it was felt she could be safely  discharged home with keflex for 7 days along with pain medication which she was asked to use sparingly.  She understands the medication may cause drowsiness and constipation.  A follow up has been scheduled for her, but was encouraged to call sooner with any concerns.  Wound care instructions given to pt, verbalizes understanding.   Discharge Instructions   Future Appointments Provider Department Dept Phone   12/27/2012 2:15 PM [name], MD [facility] [place] [phone]       Medication List    TAKE these medications       aspirin 81 MG EC tablet  Take 81 mg by mouth every evening.     atorvastatin 10 MG tablet  Commonly known as:  LIPITOR  Take 10 mg by mouth at bedtime.     cephALEXin 500 MG capsule  Commonly known as:  KEFLEX  Take 1 capsule (500 mg total) by mouth 4 (four) times daily.     Cholecalciferol 5000 UNITS capsule  Take 5,000 Units by mouth every morning. Vitamin D     doxycycline 100 MG tablet  Commonly known as:  VIBRA-TABS  Take 100 mg by mouth 2 (two) times daily as needed. For rosacea     enalapril 5 MG tablet  Commonly known as:  VASOTEC  Take 5 mg by mouth at bedtime.     HYDROcodone-acetaminophen 5-325 MG per tablet  Commonly known as:  NORCO  Take 1-2 tablets by mouth every 6 (six) hours as needed for pain.     INVOKANA 300 MG Tabs  Generic drug:  Canagliflozin  Take 300 mg by mouth every morning. Before breakfast  LANTUS SOLOSTAR 100 UNIT/ML injection  Generic drug:  insulin glargine  Inject 30 Units into the skin 2 (two) times daily.     levothyroxine 150 MCG tablet  Commonly known as:  SYNTHROID, LEVOTHROID  Take 150 mcg by mouth daily before breakfast.     metFORMIN 1000 MG tablet  Commonly known as:  GLUCOPHAGE  Take 1 tablet (1,000 mg total) by mouth 2 (two) times daily.     methocarbamol 500 MG tablet  Commonly known as:  ROBAXIN  Take 500 mg by mouth every 8 (eight) hours as needed. For muscle spasms      metroNIDAZOLE 0.75 % cream  Commonly known as:  METROCREAM  Apply 1 application topically 2 (two) times daily as needed. For rosacea     VICTOZA 18 MG/3ML Soln injection  Generic drug:  Liraglutide  Inject 1.8 mg into the skin at bedtime.           Follow-up Information   Follow up with WYATT, Marta Lamas, MD. Schedule an appointment as soon as possible for a visit on 01/06/2013. (Your appointment is scheduled at 2:15.  Please be sure to arrive at 2pm.)    Contact information:   [address]  [facility] [place] [place] [address] [phone]        The results of significant diagnostics from this hospitalization (including imaging, microbiology, ancillary and laboratory) are listed below for reference.    Significant Diagnostic Studies: Ct Abdomen Pelvis W Contrast  12/09/2012  *RADIOLOGY REPORT*  Clinical Data: Abdominal pain  CT ABDOMEN AND PELVIS WITH CONTRAST  Technique:  Multidetector CT imaging of the abdomen and pelvis was performed following the standard protocol during bolus administration of intravenous contrast.  Contrast: OMNIPAQUE IOHEXOL 300 MG/ML  SOLN  Comparison: 08/03/2012  Findings: Limited images through the lung bases demonstrate no significant appreciable abnormality. The heart size is within normal limits. No pleural or pericardial effusion.  8 mm hypodensity within the right hepatic lobe on image 27 is nonspecific, unchanged.  Posterior to this, there is a hypodensity on image 25 that appears water density, most in keeping with a biliary cyst.  Focal hypodensity adjacent to the falciform is unchanged and favored to reflect focal fat or variant perfusion.  Absent gallbladder.  No biliary ductal dilatation.  Unremarkable spleen, pancreas, right adrenal gland.  Left adrenal nodule measuring 1.5 cm is indeterminate however without significant interval change.  There are subcentimeter hypodensities within the left kidney, incompletely  characterized. Otherwise, symmetric renal enhancement. No hydronephrosis or hydroureter.  Sigmoid colonic anastomotic suture.  Colonic diverticulosis.  No CT evidence for colitis or diverticulitis.  Normal appendix.  Small bowel loops are normal course and caliber.  No free intraperitoneal air or fluid.  Infraumbilical ventral subcutaneous fluid pocket measuring 4.2 x 2.5 cm.  Thickening/stranding of the adjacent subcutaneous fat.  No peritoneal communication.  No lymphadenopathy.  Normal caliber aorta and branch vessels.  Thin-walled bladder.  Unremarkable CT appearance to the uterus. Bilateral ovarian cysts and a more tubular cystic density left adnexa may reflect hydrosalpinx or a paraovarian cyst.  Multilevel degenerative changes of the imaged spine. No acute or aggressive appearing osseous lesion.  Postoperative changes of the lower lumbar spine with L4-5 fusion.  IMPRESSION:  2.5 x 4.2 cm fluid pocket within the infraumbilical ventral subcutaneous fat.  Infection not excluded.  Other unchanged findings as above.   Original Report Authenticated By: Jearld Lesch, M.D.    [facility]  Digital Screening  11/26/2012  *RADIOLOGY REPORT*  Clinical Data: Screening.  DIGITAL BILATERAL SCREENING MAMMOGRAM WITH CAD  Comparison:  Previous exams.  FINDINGS:  ACR Breast Density Category 2: There is a scattered fibroglandular pattern.  In the left breast, a possible mass warrants further evaluation with spot compression views and possibly ultrasound.  In the right breast, no masses or malignant type calcifications are identified.  Images were processed with CAD.  IMPRESSION: Further evaluation is suggested for possible mass in the left breast.  RECOMMENDATION: Diagnostic mammogram and possibly ultrasound of the left breast. (Code:FI-L-58M)  The patient will be contacted regarding the findings, and additional imaging will be scheduled.  BI-RADS CATEGORY 0:  Incomplete.  Need additional imaging evaluation and/or prior mammograms  for comparison.   Original Report Authenticated By: Rolla Plate, M.D.    Ct Image Guided Fluid Drain By Catheter  12/10/2012  *RADIOLOGY REPORT*  Indication: History of ventral hernia repair, now with abdominal pain and fever.  Recent CT scan demonstrates indeterminate fluid collection within the infra umbilical ventral subcutaneous fat.  CT GUIDED VENTRAL ABDOMINAL WALL DRAINAGE CATHETER PLACEMENT  Comparison: CT abdomen pelvis - 12/10/2038  Medications: Fentanyl 75 mcg IV; Versed 25 mg IV  Total Moderate Sedation time: 24 minutes  Contrast: None  Complications: None immediate  Technique / Findings:  Informed written consent was obtained from the patient after a discussion of the risks, benefits and alternatives to treatment. The patient was placed supine on the CT gantry and a pre procedural CT was performed re-demonstrating the known abscess/fluid collection within the ventral soft tissues of the midline of the abdomen, measuring approximately 4.1 x 2.4 cm in diameter (image 21, series one).  The procedure was planned.   A timeout was performed prior to the initiation of the procedure.  The skin overlying the left lower abdominal quadrant was prepped and draped in the usual sterile fashion.   The overlying soft tissues were anesthetized with 1% lidocaine with epinephrine. Appropriate trajectory was planned with the use of a 22 gauge spinal needle.  An 18 gauge trocar needle was advanced into the abscess/fluid collection and a short Amplatz super stiff wire was coiled within the collection.   Appropriate positioning was confirmed with a limited CT scan.  The tract was serially dilated however there is difficulty placing the 10-French all-purpose drainage catheter   with the catheter coiled external to the fluid collection.  As such, the fluid collection again accessed with a 5-French Yueh sheath needle.  An Amplatz wire was coiled within the collection. Appropriate positioning was confirmed with a limited  CT can.  This allowed for placement of an 8-French all-purpose drainage catheter which was coiled within the collection.  Appropriate positioning was confirmed with a limited postprocedural CT scan.  15 ml of reddish/brown serous fluid was aspirated. The aspirated fluid was capped and sent to the laboratory for analysis.  The tube was connected to a drainage bag and sutured in place.  A dressing was placed.  The patient tolerated the procedure well without immediate post procedural complication.  Impression:  Successful CT guided placement of a 8 [place] all purpose drain catheter into the ventral abdominal wall fluid collection with aspiration of 15 mL of reddish/brown serous fluid.  Samples were sent to the laboratory as requested by the ordering clinical team.   Original Report Authenticated By: Tacey Ruiz, MD     Microbiology: Recent Results (from the past 240 hour(s))  CULTURE, ROUTINE-ABSCESS  Status: None   Collection Time    12/10/12  2:29 PM      Result Value Range Status   Specimen Description ABSCESS ABDOMEN   Final   Special Requests ANTERIOR ABDOMINAL WALL FLUID COLLECION   Final   Gram Stain     Final   Value: MODERATE WBC PRESENT,BOTH PMN AND MONONUCLEAR     NO SQUAMOUS EPITHELIAL CELLS SEEN     NO ORGANISMS SEEN   Culture NO GROWTH 1 DAY   Final   Report Status PENDING   Incomplete     Labs: Basic Metabolic Panel:  Recent Labs Lab 12/09/12 1813  NA 139  K 3.6  CL 103  CO2 25  GLUCOSE 86  BUN 9  CREATININE 0.46*  CALCIUM 9.4   Liver Function Tests:  Recent Labs Lab 12/09/12 1813  AST 20  ALT 17  ALKPHOS 57  BILITOT 0.5  PROT 7.5  ALBUMIN 4.1    Recent Labs Lab 12/09/12 1813  LIPASE 47   No results found for this basename: AMMONIA,  in the last 168 hours CBC:  Recent Labs Lab 12/09/12 1813 12/11/12 0622  WBC 8.9 8.4  NEUTROABS 5.5  --   HGB 14.5 14.9  HCT 41.7 43.4  MCV 83.9 84.6  PLT 276 243     Recent Labs Lab 12/10/12 1947  12/10/12 2347 12/11/12 0352 12/11/12 0746 12/11/12 1204  GLUCAP 147* 105* 125* 125* 110*     Signed:  Chaley Castellanos, ANP-BC

## 2012-12-11 NOTE — Discharge Summary (Signed)
Home on antibiotics for a few days.  Will check cultures over the next few days.  Marta Lamas. Gae Bon, MD, FACS [phone] [facility]

## 2012-12-11 NOTE — Progress Notes (Signed)
PHARMACIST - PHYSICIAN COMMUNICATION  CONCERNING: Protonix IV to Oral Route Change Policy  RECOMMENDATION: This patient is receiving Protonix by the intravenous route.  Based on criteria approved by the Pharmacy and Therapeutics Committee, this is being converted to the equivalent oral dose form(s).   DESCRIPTION: These criteria include:  The patient is not neutropenic and does not exhibit a GI malabsorption state. Did not have GIB this admit.  The patient is eating (either orally or via tube) and/or has been taking other orally administered medications for a least 24 hours  If you have questions about this conversion, please contact the Pharmacy Department  Jeslin Bazinet K. Allena Katz, PharmD, BCPS.  Clinical Pharmacist Pager [phone]. 12/11/2012 9:23 AM

## 2012-12-11 NOTE — Progress Notes (Signed)
Doing well now.  Still has some discomfort, but much better.  Okay to go home with total five days of antibiotics, Keflex.  Marta Lamas. Gae Bon, MD, FACS [phone] [facility]

## 2012-12-11 NOTE — Progress Notes (Signed)
Inpatient Diabetes Program Recommendations  AACE/ADA: New Consensus Statement on Inpatient Glycemic Control (2013)  Target Ranges:  Prepandial:   less than 140 mg/dL      Peak postprandial:   less than 180 mg/dL (1-2 hours)      Critically ill patients:  140 - 180 mg/dL   Reason for Visit: Results for RMONI, KEPLINGER (MRN [id number]) as of 12/11/2012 13:10  Ref. Range 12/10/2012 23:47 12/11/2012 03:52 12/11/2012 06:22 12/11/2012 07:46 12/11/2012 12:04  Glucose-Capillary Latest Range: 70-99 mg/dL 829 (H) 562 (H)  130 (H) 110 (H)   Note patient was taking Lantus 30 units bid prior to admission according to medication reconciliation.  Patient does not appear to need Lantus at this time.  May consider decreasing correction to moderate q 4 hours.  Will follow.

## 2012-12-12 NOTE — ED Provider Notes (Signed)
Medical screening examination/treatment/procedure(s) were performed by non-physician practitioner and as supervising physician I was immediately available for consultation/collaboration.   Gwyneth Sprout, MD 12/12/12 2259

## 2012-12-13 LAB — CULTURE, ROUTINE-ABSCESS

## 2012-12-18 ENCOUNTER — Ambulatory Visit (HOSPITAL_COMMUNITY)
Admission: RE | Admit: 2012-12-18 | Discharge: 2012-12-18 | Disposition: A | Discharge status: Home / Self Care | Payer: Medicare Other | Source: Ambulatory Visit | Attending: Obstetrics & Gynecology | Admitting: Obstetrics & Gynecology

## 2012-12-18 DIAGNOSIS — R928 Other abnormal and inconclusive findings on diagnostic imaging of breast: Secondary | ICD-10-CM | POA: Diagnosis not present

## 2012-12-25 ENCOUNTER — Encounter (HOSPITAL_COMMUNITY): Payer: Medicare Other

## 2012-12-27 ENCOUNTER — Encounter (INDEPENDENT_AMBULATORY_CARE_PROVIDER_SITE_OTHER): Payer: Self-pay | Admitting: General Surgery

## 2012-12-27 ENCOUNTER — Ambulatory Visit (INDEPENDENT_AMBULATORY_CARE_PROVIDER_SITE_OTHER): Payer: Medicare Other | Admitting: General Surgery

## 2012-12-27 VITALS — BP 122/60 | HR 96 | Temp 98.6°F | Resp 18 | Ht 66.0 in | Wt 215.0 lb

## 2012-12-27 DIAGNOSIS — R109 Unspecified abdominal pain: Secondary | ICD-10-CM

## 2012-12-27 DIAGNOSIS — G8918 Other acute postprocedural pain: Secondary | ICD-10-CM

## 2012-12-27 MED ORDER — HYDROCODONE-ACETAMINOPHEN 5-325 MG PO TABS: 1.0000 | ORAL_TABLET | Freq: Four times a day (QID) | ORAL | Status: DC | PRN

## 2012-12-27 NOTE — Progress Notes (Signed)
The patient continues to have abdominal pain or discomfort mostly in the right lateral aspect of her incision in the upper part of her abdomen. She also remarks that she has similar but less severe discomfort in the left side. She has excellent bowel sounds. All looks great without any evidence of infection. There is no obvious fluid collection or edema.  I believe the patient's discomfort is coming from the tension and pressure on the suture repair at the margins of the hernia repair. I do not believe there is any infection. She is afebrile and her other vital signs are stable. I will see the patient back in 3 months to reassess her but I told her that it could take some time before the discomfort from a large hernia repair and starts to get better. In the meantime I will give her some additional pain medicines, hydrocodone with acetaminophen #60.

## 2013-01-15 DIAGNOSIS — E785 Hyperlipidemia, unspecified: Secondary | ICD-10-CM | POA: Diagnosis not present

## 2013-01-15 DIAGNOSIS — E039 Hypothyroidism, unspecified: Secondary | ICD-10-CM | POA: Diagnosis not present

## 2013-01-15 DIAGNOSIS — E559 Vitamin D deficiency, unspecified: Secondary | ICD-10-CM | POA: Diagnosis not present

## 2013-01-15 DIAGNOSIS — E1129 Type 2 diabetes mellitus with other diabetic kidney complication: Secondary | ICD-10-CM | POA: Diagnosis not present

## 2013-01-24 DIAGNOSIS — E039 Hypothyroidism, unspecified: Secondary | ICD-10-CM | POA: Diagnosis not present

## 2013-01-24 DIAGNOSIS — E8881 Metabolic syndrome: Secondary | ICD-10-CM | POA: Diagnosis not present

## 2013-01-24 DIAGNOSIS — E282 Polycystic ovarian syndrome: Secondary | ICD-10-CM | POA: Diagnosis not present

## 2013-01-24 DIAGNOSIS — R809 Proteinuria, unspecified: Secondary | ICD-10-CM | POA: Diagnosis not present

## 2013-01-24 DIAGNOSIS — E559 Vitamin D deficiency, unspecified: Secondary | ICD-10-CM | POA: Diagnosis not present

## 2013-01-24 DIAGNOSIS — E785 Hyperlipidemia, unspecified: Secondary | ICD-10-CM | POA: Diagnosis not present

## 2013-01-24 DIAGNOSIS — E1129 Type 2 diabetes mellitus with other diabetic kidney complication: Secondary | ICD-10-CM | POA: Diagnosis not present

## 2013-01-24 DIAGNOSIS — E1165 Type 2 diabetes mellitus with hyperglycemia: Secondary | ICD-10-CM | POA: Diagnosis not present

## 2013-02-06 DIAGNOSIS — B029 Zoster without complications: Secondary | ICD-10-CM | POA: Diagnosis not present

## 2013-03-11 ENCOUNTER — Encounter (INDEPENDENT_AMBULATORY_CARE_PROVIDER_SITE_OTHER): Payer: Self-pay | Admitting: General Surgery

## 2013-03-11 ENCOUNTER — Ambulatory Visit (INDEPENDENT_AMBULATORY_CARE_PROVIDER_SITE_OTHER): Payer: Medicare Other | Admitting: General Surgery

## 2013-03-11 VITALS — BP 122/70 | HR 80 | Resp 16 | Ht 66.0 in | Wt 215.4 lb

## 2013-03-11 DIAGNOSIS — Z09 Encounter for follow-up examination after completed treatment for conditions other than malignant neoplasm: Secondary | ICD-10-CM

## 2013-03-11 NOTE — Progress Notes (Signed)
The patient is doing well from a hernia repair standpoint. However she has developed shingles on her right flank. This is well away from her operative site. Otherwise she is doing well. She is to return to see me on a when necessary basis.

## 2013-03-12 DIAGNOSIS — L0293 Carbuncle, unspecified: Secondary | ICD-10-CM | POA: Diagnosis not present

## 2013-03-14 ENCOUNTER — Telehealth (INDEPENDENT_AMBULATORY_CARE_PROVIDER_SITE_OTHER): Payer: Self-pay

## 2013-03-14 NOTE — Telephone Encounter (Signed)
Called pt and she told me the area she showed Dr Lindie Spruce on 7/22 swelled up later that night. She called her medical doctor the next morning and they had her come in. Her MD "froze" the area and then aspirated some fluid from it. Patient states area is draining a little now but it looks much better. She is covering area with band aid. She just wanted to update [place].

## 2013-03-14 NOTE — Telephone Encounter (Signed)
Message copied by Brennan Bailey on Fri Mar 14, 2013  3:47 PM ------      Message from: Larry Sierras      Created: Fri Mar 14, 2013 12:53 PM      Regarding: INFO      Contact: [phone]       PT HAS INFO RE: LUMP NOTICED ON OFFICE VISIT/03-11-13/NO ER/PT HAS BEEN SEEN PER MED MD/WAS INSTRUCTED PER JW TO INFORM HIM AS TO STATUS OF AREA/GM ------

## 2013-03-25 ENCOUNTER — Telehealth (INDEPENDENT_AMBULATORY_CARE_PROVIDER_SITE_OTHER): Payer: Self-pay

## 2013-03-25 DIAGNOSIS — T148XXA Other injury of unspecified body region, initial encounter: Secondary | ICD-10-CM | POA: Diagnosis not present

## 2013-03-25 NOTE — Telephone Encounter (Signed)
Patient states she is having bloody drainage from post incision site , red swollen and painful. She called her  PCP he advised her to  follow up with Dr. Lindie Spruce . Advised her Dr. Lindie Spruce is not available today and Urg office is full. I scheduled her for URg 03/26/13 and to call DR Doylene Bode to see if she could be seen today there and to call if she was able to be seen to cancel for 03/26/13 URG ov She was also advised  if her condition got worse to go to the ER

## 2013-03-26 ENCOUNTER — Encounter (INDEPENDENT_AMBULATORY_CARE_PROVIDER_SITE_OTHER): Payer: Medicare Other | Admitting: Surgery

## 2013-03-27 ENCOUNTER — Ambulatory Visit (INDEPENDENT_AMBULATORY_CARE_PROVIDER_SITE_OTHER): Payer: Medicare Other | Admitting: General Surgery

## 2013-03-27 ENCOUNTER — Encounter (INDEPENDENT_AMBULATORY_CARE_PROVIDER_SITE_OTHER): Payer: Self-pay | Admitting: General Surgery

## 2013-03-27 VITALS — BP 96/68 | HR 72 | Resp 16 | Ht 66.0 in | Wt 212.4 lb

## 2013-03-27 DIAGNOSIS — S301XXA Contusion of abdominal wall, initial encounter: Secondary | ICD-10-CM | POA: Diagnosis not present

## 2013-03-27 DIAGNOSIS — S3011XA Contusion of abdominal wall, initial encounter: Secondary | ICD-10-CM | POA: Diagnosis not present

## 2013-03-27 DIAGNOSIS — T148XXA Other injury of unspecified body region, initial encounter: Secondary | ICD-10-CM | POA: Insufficient documentation

## 2013-03-27 LAB — CREATININE, SERUM: Creat: 0.5 mg/dL (ref 0.50–1.10)

## 2013-03-27 LAB — BUN: BUN: 14 mg/dL (ref 6–23)

## 2013-03-27 NOTE — Progress Notes (Signed)
Subjective:     Patient ID: CLARITY CISZEK, female   DOB: April 04, 1966, 47 y.o.   MRN: [id number]  HPI 47 year old Caucasian female comes in because of complaints of pain, drainage, and swelling in her right lateral upper abdominal wall just underneath her breast. She underwent an open repair of the incisional hernia with mesh underlay in December 2013. This was complicated by postoperative seroma that was drained. She states on June 19 she developed 3 small spots on her right lateral abdominal wall. She saw her primary care physician and was diagnosed and treated for shingles. She said the area was very painful and intense and discomfort. She completed that treatment. She states that toward the end of July she developed redness under the 3 spots. This was associated with puffiness and fluid underneath the 3 spots. She states that she saw her primary care physician who froze the area in the office and then lanced it. She states that it drained a lot of clear fluid. She was given a prescription for doxycycline which she completed. She states that after the antibiotics are finished she developed return of redness and puffiness on August 5. At this point it started draining blood. She was seen earlier today in her primary care physician's office and was evaluated. She was restarted on oral antibiotics and was sent here for evaluation. She denies any fever, nausea, vomiting, diarrhea or constipation. She denies any trauma to the area.  Review of Systems     Objective:   Physical Exam BP 96/68  Pulse 72  Resp 16  Ht 5\' 6"  (1.676 m)  Wt 212 lb 6.4 oz (96.344 kg)  BMI 34.3 kg/m2 Alert, no apparent distress Abdomen-soft nontender, and right upper quadrant on the lateral aspect near her inframammary ridge there are 3-to spots. One has a skin defect in it that is draining what appears to be old blood. Inferior to that is a slightly boggy area that feels that it may have some fluid underneath it. There is hardly  any cellulitis. There is no induration.        Assessment:     Abdominal wall hematoma     Plan:     It is unclear as to etiology of this hematoma. I not sure she had a shingles rash and vesicles became infected. The 3 small spots are directly in her inframammary ridge so wondering if this is possibly folliculitis. Nonetheless I did not think she needed further incision and drainage today. There is minimal cellulitis. The skin does not feel indurated. I encouraged her to finish the antibiotics. We will set her up for a CT scan to evaluate the area. She'll be scheduled to follow with Dr. Lindie Spruce for next week. She was instructed to call for worsening symptoms.   Mary Sella. Andrey Campanile, MD, FACS General, Bariatric, & Minimally Invasive Surgery [facility], [place]

## 2013-03-27 NOTE — Patient Instructions (Addendum)
Call if area worsens Finish antibiotics

## 2013-03-31 ENCOUNTER — Ambulatory Visit (HOSPITAL_COMMUNITY)
Admission: RE | Admit: 2013-03-31 | Discharge: 2013-03-31 | Disposition: A | Discharge status: Home / Self Care | Payer: Medicare Other | Source: Ambulatory Visit | Attending: General Surgery | Admitting: General Surgery

## 2013-03-31 ENCOUNTER — Encounter (HOSPITAL_COMMUNITY): Payer: Self-pay

## 2013-03-31 DIAGNOSIS — S301XXA Contusion of abdominal wall, initial encounter: Secondary | ICD-10-CM | POA: Insufficient documentation

## 2013-03-31 DIAGNOSIS — X58XXXA Exposure to other specified factors, initial encounter: Secondary | ICD-10-CM | POA: Insufficient documentation

## 2013-03-31 DIAGNOSIS — N859 Noninflammatory disorder of uterus, unspecified: Secondary | ICD-10-CM | POA: Diagnosis not present

## 2013-03-31 MED ORDER — IOHEXOL 300 MG/ML  SOLN
100.0000 mL | Freq: Once | INTRAMUSCULAR | Status: AC | PRN
  Administered 2013-03-31: 100 mL via INTRAVENOUS

## 2013-04-03 ENCOUNTER — Encounter (INDEPENDENT_AMBULATORY_CARE_PROVIDER_SITE_OTHER): Payer: Self-pay | Admitting: General Surgery

## 2013-04-03 ENCOUNTER — Ambulatory Visit (INDEPENDENT_AMBULATORY_CARE_PROVIDER_SITE_OTHER): Payer: Medicare Other | Admitting: General Surgery

## 2013-04-03 VITALS — BP 128/72 | HR 80 | Resp 16 | Ht 66.0 in | Wt 211.8 lb

## 2013-04-03 DIAGNOSIS — IMO0002 Reserved for concepts with insufficient information to code with codable children: Secondary | ICD-10-CM | POA: Diagnosis not present

## 2013-04-03 MED ORDER — DOXYCYCLINE HYCLATE 100 MG PO TABS: 100.0000 mg | ORAL_TABLET | Freq: Two times a day (BID) | ORAL | Status: DC

## 2013-04-03 NOTE — Progress Notes (Signed)
The patient has a mucoid and serous drainage from her right lateral chest wall in the right inframammary area. Approximately 10 cc of this mucoid material was drained.  Cultures of this area have not been taken to she has been on doxycycline. The CT scan of her abdomen and pelvis demonstrate changes in her operative area but no connection with the right chest wall area.  I believe this just represents a superficial infection in the subcutaneous and skin tissue. I will continue to treat her with doxycycline for at least another 10 days and have her reassessed. There is no need for surgical drainage at this time.

## 2013-04-08 ENCOUNTER — Encounter (INDEPENDENT_AMBULATORY_CARE_PROVIDER_SITE_OTHER): Payer: Self-pay

## 2013-04-08 ENCOUNTER — Telehealth (INDEPENDENT_AMBULATORY_CARE_PROVIDER_SITE_OTHER): Payer: Self-pay

## 2013-04-08 NOTE — Telephone Encounter (Signed)
Pt calling requesting a refill of Percocet from Dr. Lindie Spruce.  Dr. Lindie Spruce was paged and authorized protocol Norco 5/325 #30 w/ no refills which was called to pt's pharmacy.  He advised that future prescriptions should come from her PCP, Dr. Ouida Sills.  All office notes and a copy of CT report were faxed to Dr. Alonza Smoker office per pt's request.

## 2013-04-22 ENCOUNTER — Telehealth (INDEPENDENT_AMBULATORY_CARE_PROVIDER_SITE_OTHER): Payer: Self-pay | Admitting: General Surgery

## 2013-04-22 ENCOUNTER — Encounter (INDEPENDENT_AMBULATORY_CARE_PROVIDER_SITE_OTHER): Payer: Self-pay | Admitting: General Surgery

## 2013-04-22 ENCOUNTER — Ambulatory Visit (INDEPENDENT_AMBULATORY_CARE_PROVIDER_SITE_OTHER): Payer: Medicare Other | Admitting: General Surgery

## 2013-04-22 VITALS — BP 124/74 | HR 70 | Temp 98.2°F | Resp 18 | Ht 64.0 in | Wt 217.0 lb

## 2013-04-22 DIAGNOSIS — T148XXA Other injury of unspecified body region, initial encounter: Secondary | ICD-10-CM | POA: Diagnosis not present

## 2013-04-22 NOTE — Telephone Encounter (Signed)
Have not heard from Dr. Lindie Spruce, so scheduled pt in to the Urgent office today, for the wound drainage to be evaluated.

## 2013-04-22 NOTE — Progress Notes (Signed)
Patient ID: Nancy [name], female   DOB: 1966/02/13, 47 y.o.   MRN: [id number] The patient is a 47 year old female with a previous right subcostal drainage site from a seroma. The patient states she finished her antibiotics last week and yesterday he had a spontaneous drainage of foot seemed to be a bloody seroma. Pasty she has pain in this area at this time. She has any fevers noticed any surrounding erythema.  On exam: Small punctate wound with serosanguineous drainage no erythema  Assessment and plan: 47 year old female with a right subcostal seroma 1. Releases this is continuing to spontaneously drain there is no need for any incision and drainage or aspiration. 2. The patient  Can follow up with Dr. Lindie Spruce has scheduled 3. I will give the patient a prescription for Percocet for the pain.

## 2013-04-22 NOTE — Telephone Encounter (Signed)
Pt of Dr. Lindie Spruce called in to report the area is again red and puffy/ swollen.  Yesterday, blood was "pouring of the pocket" so she pressed on it to empty the pocket.  No blood since then.  Her follow up appt is in another week.  Will page and update Dr. Lindie Spruce for instructions.

## 2013-04-25 DIAGNOSIS — E1129 Type 2 diabetes mellitus with other diabetic kidney complication: Secondary | ICD-10-CM | POA: Diagnosis not present

## 2013-04-25 DIAGNOSIS — E559 Vitamin D deficiency, unspecified: Secondary | ICD-10-CM | POA: Diagnosis not present

## 2013-04-25 DIAGNOSIS — E785 Hyperlipidemia, unspecified: Secondary | ICD-10-CM | POA: Diagnosis not present

## 2013-04-25 DIAGNOSIS — E039 Hypothyroidism, unspecified: Secondary | ICD-10-CM | POA: Diagnosis not present

## 2013-04-29 ENCOUNTER — Encounter (INDEPENDENT_AMBULATORY_CARE_PROVIDER_SITE_OTHER): Payer: Self-pay | Admitting: General Surgery

## 2013-04-29 ENCOUNTER — Ambulatory Visit (INDEPENDENT_AMBULATORY_CARE_PROVIDER_SITE_OTHER): Payer: Medicare Other | Admitting: General Surgery

## 2013-04-29 VITALS — BP 114/66 | HR 68 | Resp 16 | Ht 66.5 in | Wt 217.6 lb

## 2013-04-29 DIAGNOSIS — B028 Zoster with other complications: Secondary | ICD-10-CM | POA: Diagnosis not present

## 2013-04-29 NOTE — Progress Notes (Signed)
The area on the patient's right lateral chest wall is still draining a small amount of serous fluid but he does not appear to be infected. There is no fluctuance in that area. I cannot express any drainage. It seems to be minimally tender.  This is an area of zoster that appeared to have gotten infected and is now doing well off of antibiotics for more than a week. I want to see the patient in one month while she has been off her antibiotics to reassess her for complete resolution of this problem. She is due to see her primary care physician in approximately 10 days. Doubt will provide some evaluation of this problem prior to her seeing me in approximately one month.  The wound was inspected then cover with 4 x 4 gauze without antibiotic ointment.

## 2013-05-02 DIAGNOSIS — E8881 Metabolic syndrome: Secondary | ICD-10-CM | POA: Diagnosis not present

## 2013-05-02 DIAGNOSIS — E039 Hypothyroidism, unspecified: Secondary | ICD-10-CM | POA: Diagnosis not present

## 2013-05-02 DIAGNOSIS — E785 Hyperlipidemia, unspecified: Secondary | ICD-10-CM | POA: Diagnosis not present

## 2013-05-02 DIAGNOSIS — E282 Polycystic ovarian syndrome: Secondary | ICD-10-CM | POA: Diagnosis not present

## 2013-05-02 DIAGNOSIS — E559 Vitamin D deficiency, unspecified: Secondary | ICD-10-CM | POA: Diagnosis not present

## 2013-05-02 DIAGNOSIS — E1129 Type 2 diabetes mellitus with other diabetic kidney complication: Secondary | ICD-10-CM | POA: Diagnosis not present

## 2013-05-02 DIAGNOSIS — R809 Proteinuria, unspecified: Secondary | ICD-10-CM | POA: Diagnosis not present

## 2013-05-09 DIAGNOSIS — B029 Zoster without complications: Secondary | ICD-10-CM | POA: Diagnosis not present

## 2013-05-09 DIAGNOSIS — E1129 Type 2 diabetes mellitus with other diabetic kidney complication: Secondary | ICD-10-CM | POA: Diagnosis not present

## 2013-06-03 ENCOUNTER — Encounter (INDEPENDENT_AMBULATORY_CARE_PROVIDER_SITE_OTHER): Payer: Self-pay

## 2013-06-03 ENCOUNTER — Encounter (INDEPENDENT_AMBULATORY_CARE_PROVIDER_SITE_OTHER): Payer: Self-pay | Admitting: General Surgery

## 2013-06-03 ENCOUNTER — Ambulatory Visit (INDEPENDENT_AMBULATORY_CARE_PROVIDER_SITE_OTHER): Payer: Medicare Other | Admitting: General Surgery

## 2013-06-03 VITALS — BP 130/78 | HR 80 | Temp 98.6°F | Resp 14 | Ht 66.0 in | Wt 217.6 lb

## 2013-06-03 DIAGNOSIS — B028 Zoster with other complications: Secondary | ICD-10-CM | POA: Diagnosis not present

## 2013-06-03 NOTE — Progress Notes (Signed)
The patient continues to have a small pinhole size area in the right subcostal area that drains a small amount of serous fluid daily. He does not appear to be infected. She has minimal to no discomfort in that area. The surrounding skin is darkened and discolored but not erythematous and not warm.  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 There is no surgical management needed at this time. I do think that he would continue to heal without antibiotics and subsequently closed. Advised the patient to keep it covered for at least one week after the drainage has ceased. But I do expect this to continue to improve without surgery.  I examined the patient's previous hernia repair site and hernia repair appears to be intact with no evidence of recurrence. There is no infection in this area.  The patient does not require followup visit to this office.

## 2013-06-20 DIAGNOSIS — Z23 Encounter for immunization: Secondary | ICD-10-CM | POA: Diagnosis not present

## 2013-07-31 DIAGNOSIS — E559 Vitamin D deficiency, unspecified: Secondary | ICD-10-CM | POA: Diagnosis not present

## 2013-07-31 DIAGNOSIS — E785 Hyperlipidemia, unspecified: Secondary | ICD-10-CM | POA: Diagnosis not present

## 2013-07-31 DIAGNOSIS — E1129 Type 2 diabetes mellitus with other diabetic kidney complication: Secondary | ICD-10-CM | POA: Diagnosis not present

## 2013-07-31 DIAGNOSIS — E039 Hypothyroidism, unspecified: Secondary | ICD-10-CM | POA: Diagnosis not present

## 2013-08-07 DIAGNOSIS — E785 Hyperlipidemia, unspecified: Secondary | ICD-10-CM | POA: Diagnosis not present

## 2013-08-07 DIAGNOSIS — E282 Polycystic ovarian syndrome: Secondary | ICD-10-CM | POA: Diagnosis not present

## 2013-08-07 DIAGNOSIS — E039 Hypothyroidism, unspecified: Secondary | ICD-10-CM | POA: Diagnosis not present

## 2013-08-07 DIAGNOSIS — E1129 Type 2 diabetes mellitus with other diabetic kidney complication: Secondary | ICD-10-CM | POA: Diagnosis not present

## 2013-08-07 DIAGNOSIS — E559 Vitamin D deficiency, unspecified: Secondary | ICD-10-CM | POA: Diagnosis not present

## 2013-08-07 DIAGNOSIS — E8881 Metabolic syndrome: Secondary | ICD-10-CM | POA: Diagnosis not present

## 2013-08-07 DIAGNOSIS — R809 Proteinuria, unspecified: Secondary | ICD-10-CM | POA: Diagnosis not present

## 2013-09-09 DIAGNOSIS — E785 Hyperlipidemia, unspecified: Secondary | ICD-10-CM | POA: Diagnosis not present

## 2013-09-09 DIAGNOSIS — E119 Type 2 diabetes mellitus without complications: Secondary | ICD-10-CM | POA: Diagnosis not present

## 2013-11-06 DIAGNOSIS — E039 Hypothyroidism, unspecified: Secondary | ICD-10-CM | POA: Diagnosis not present

## 2013-11-06 DIAGNOSIS — E1129 Type 2 diabetes mellitus with other diabetic kidney complication: Secondary | ICD-10-CM | POA: Diagnosis not present

## 2013-11-06 DIAGNOSIS — E785 Hyperlipidemia, unspecified: Secondary | ICD-10-CM | POA: Diagnosis not present

## 2013-11-13 ENCOUNTER — Other Ambulatory Visit: Payer: Self-pay | Admitting: Obstetrics & Gynecology

## 2013-11-13 DIAGNOSIS — E039 Hypothyroidism, unspecified: Secondary | ICD-10-CM | POA: Diagnosis not present

## 2013-11-13 DIAGNOSIS — E785 Hyperlipidemia, unspecified: Secondary | ICD-10-CM | POA: Diagnosis not present

## 2013-11-13 DIAGNOSIS — E1165 Type 2 diabetes mellitus with hyperglycemia: Secondary | ICD-10-CM | POA: Diagnosis not present

## 2013-11-13 DIAGNOSIS — E8881 Metabolic syndrome: Secondary | ICD-10-CM | POA: Diagnosis not present

## 2013-11-13 DIAGNOSIS — E559 Vitamin D deficiency, unspecified: Secondary | ICD-10-CM | POA: Diagnosis not present

## 2013-11-13 DIAGNOSIS — Z1231 Encounter for screening mammogram for malignant neoplasm of breast: Secondary | ICD-10-CM

## 2013-11-13 DIAGNOSIS — R809 Proteinuria, unspecified: Secondary | ICD-10-CM | POA: Diagnosis not present

## 2013-11-13 DIAGNOSIS — E1129 Type 2 diabetes mellitus with other diabetic kidney complication: Secondary | ICD-10-CM | POA: Diagnosis not present

## 2013-11-13 DIAGNOSIS — E282 Polycystic ovarian syndrome: Secondary | ICD-10-CM | POA: Diagnosis not present

## 2013-11-27 ENCOUNTER — Ambulatory Visit (HOSPITAL_COMMUNITY)
Admission: RE | Admit: 2013-11-27 | Discharge: 2013-11-27 | Disposition: A | Discharge status: Home / Self Care | Payer: Medicare Other | Source: Ambulatory Visit | Attending: Obstetrics & Gynecology | Admitting: Obstetrics & Gynecology

## 2013-11-27 DIAGNOSIS — Z1231 Encounter for screening mammogram for malignant neoplasm of breast: Secondary | ICD-10-CM

## 2014-01-09 DIAGNOSIS — E1129 Type 2 diabetes mellitus with other diabetic kidney complication: Secondary | ICD-10-CM | POA: Diagnosis not present

## 2014-01-09 DIAGNOSIS — I10 Essential (primary) hypertension: Secondary | ICD-10-CM | POA: Diagnosis not present

## 2014-02-10 DIAGNOSIS — E109 Type 1 diabetes mellitus without complications: Secondary | ICD-10-CM | POA: Diagnosis not present

## 2014-02-16 DIAGNOSIS — E1165 Type 2 diabetes mellitus with hyperglycemia: Secondary | ICD-10-CM | POA: Diagnosis not present

## 2014-02-16 DIAGNOSIS — E785 Hyperlipidemia, unspecified: Secondary | ICD-10-CM | POA: Diagnosis not present

## 2014-02-16 DIAGNOSIS — E1129 Type 2 diabetes mellitus with other diabetic kidney complication: Secondary | ICD-10-CM | POA: Diagnosis not present

## 2014-02-16 DIAGNOSIS — E039 Hypothyroidism, unspecified: Secondary | ICD-10-CM | POA: Diagnosis not present

## 2014-02-16 DIAGNOSIS — E559 Vitamin D deficiency, unspecified: Secondary | ICD-10-CM | POA: Diagnosis not present

## 2014-02-19 DIAGNOSIS — E282 Polycystic ovarian syndrome: Secondary | ICD-10-CM | POA: Diagnosis not present

## 2014-02-19 DIAGNOSIS — R809 Proteinuria, unspecified: Secondary | ICD-10-CM | POA: Diagnosis not present

## 2014-02-19 DIAGNOSIS — E559 Vitamin D deficiency, unspecified: Secondary | ICD-10-CM | POA: Diagnosis not present

## 2014-02-19 DIAGNOSIS — E039 Hypothyroidism, unspecified: Secondary | ICD-10-CM | POA: Diagnosis not present

## 2014-02-19 DIAGNOSIS — E1129 Type 2 diabetes mellitus with other diabetic kidney complication: Secondary | ICD-10-CM | POA: Diagnosis not present

## 2014-02-19 DIAGNOSIS — E1165 Type 2 diabetes mellitus with hyperglycemia: Secondary | ICD-10-CM | POA: Diagnosis not present

## 2014-02-19 DIAGNOSIS — E8881 Metabolic syndrome: Secondary | ICD-10-CM | POA: Diagnosis not present

## 2014-02-19 DIAGNOSIS — E785 Hyperlipidemia, unspecified: Secondary | ICD-10-CM | POA: Diagnosis not present

## 2014-03-02 DIAGNOSIS — L719 Rosacea, unspecified: Secondary | ICD-10-CM | POA: Diagnosis not present

## 2014-03-02 DIAGNOSIS — D235 Other benign neoplasm of skin of trunk: Secondary | ICD-10-CM | POA: Diagnosis not present

## 2014-03-30 ENCOUNTER — Other Ambulatory Visit (HOSPITAL_COMMUNITY): Payer: Self-pay | Admitting: Internal Medicine

## 2014-03-30 ENCOUNTER — Ambulatory Visit (HOSPITAL_COMMUNITY)
Admission: RE | Admit: 2014-03-30 | Discharge: 2014-03-30 | Disposition: A | Discharge status: Home / Self Care | Payer: Medicare Other | Source: Ambulatory Visit | Attending: Internal Medicine | Admitting: Internal Medicine

## 2014-03-30 DIAGNOSIS — E119 Type 2 diabetes mellitus without complications: Secondary | ICD-10-CM | POA: Insufficient documentation

## 2014-03-30 DIAGNOSIS — R062 Wheezing: Secondary | ICD-10-CM

## 2014-03-30 DIAGNOSIS — R059 Cough, unspecified: Secondary | ICD-10-CM

## 2014-03-30 DIAGNOSIS — R0602 Shortness of breath: Secondary | ICD-10-CM | POA: Diagnosis not present

## 2014-03-30 DIAGNOSIS — R05 Cough: Secondary | ICD-10-CM | POA: Diagnosis not present

## 2014-03-30 DIAGNOSIS — J209 Acute bronchitis, unspecified: Secondary | ICD-10-CM | POA: Diagnosis not present

## 2014-05-15 DIAGNOSIS — C44601 Unspecified malignant neoplasm of skin of unspecified upper limb, including shoulder: Secondary | ICD-10-CM | POA: Diagnosis not present

## 2014-05-15 DIAGNOSIS — Z23 Encounter for immunization: Secondary | ICD-10-CM | POA: Diagnosis not present

## 2014-05-15 DIAGNOSIS — E1129 Type 2 diabetes mellitus with other diabetic kidney complication: Secondary | ICD-10-CM | POA: Diagnosis not present

## 2014-05-21 DIAGNOSIS — E785 Hyperlipidemia, unspecified: Secondary | ICD-10-CM | POA: Diagnosis not present

## 2014-05-21 DIAGNOSIS — E559 Vitamin D deficiency, unspecified: Secondary | ICD-10-CM | POA: Diagnosis not present

## 2014-05-21 DIAGNOSIS — E1121 Type 2 diabetes mellitus with diabetic nephropathy: Secondary | ICD-10-CM | POA: Diagnosis not present

## 2014-05-21 DIAGNOSIS — E039 Hypothyroidism, unspecified: Secondary | ICD-10-CM | POA: Diagnosis not present

## 2014-05-29 DIAGNOSIS — E559 Vitamin D deficiency, unspecified: Secondary | ICD-10-CM | POA: Diagnosis not present

## 2014-05-29 DIAGNOSIS — E782 Mixed hyperlipidemia: Secondary | ICD-10-CM | POA: Diagnosis not present

## 2014-05-29 DIAGNOSIS — E1122 Type 2 diabetes mellitus with diabetic chronic kidney disease: Secondary | ICD-10-CM | POA: Diagnosis not present

## 2014-05-29 DIAGNOSIS — E282 Polycystic ovarian syndrome: Secondary | ICD-10-CM | POA: Diagnosis not present

## 2014-05-29 DIAGNOSIS — E1165 Type 2 diabetes mellitus with hyperglycemia: Secondary | ICD-10-CM | POA: Diagnosis not present

## 2014-05-29 DIAGNOSIS — E038 Other specified hypothyroidism: Secondary | ICD-10-CM | POA: Diagnosis not present

## 2014-07-30 ENCOUNTER — Encounter: Payer: Self-pay | Admitting: Obstetrics & Gynecology

## 2014-07-30 ENCOUNTER — Other Ambulatory Visit (HOSPITAL_COMMUNITY)
Admission: RE | Admit: 2014-07-30 | Discharge: 2014-07-30 | Disposition: A | Discharge status: Home / Self Care | Payer: Medicare Other | Source: Ambulatory Visit | Attending: Obstetrics & Gynecology | Admitting: Obstetrics & Gynecology

## 2014-07-30 ENCOUNTER — Ambulatory Visit (INDEPENDENT_AMBULATORY_CARE_PROVIDER_SITE_OTHER): Payer: Medicare Other | Admitting: Obstetrics & Gynecology

## 2014-07-30 VITALS — BP 122/70 | Ht 66.0 in | Wt 227.0 lb

## 2014-07-30 DIAGNOSIS — Z124 Encounter for screening for malignant neoplasm of cervix: Secondary | ICD-10-CM | POA: Diagnosis not present

## 2014-07-30 DIAGNOSIS — Z1151 Encounter for screening for human papillomavirus (HPV): Secondary | ICD-10-CM | POA: Diagnosis not present

## 2014-07-30 DIAGNOSIS — Z01419 Encounter for gynecological examination (general) (routine) without abnormal findings: Secondary | ICD-10-CM

## 2014-07-30 NOTE — Progress Notes (Signed)
Patient ID: Nancy [name], female   DOB: 25-Jul-1966, 48 y.o.   MRN: [id number] Subjective:     Nancy [name] is a 48 y.o. female here for a routine exam.  No LMP recorded. Patient has had an ablation. No obstetric history on file. Birth Control Method:  BTL Menstrual Calendar(currently): ablation   Current complaints: none.   Current acute medical issues:  diabetes   Recent Gynecologic History No LMP recorded. Patient has had an ablation. Last Pap: 2013,  normal Last mammogram: 11/2013,  normal  Past Medical History  Diagnosis Date  . Diabetes mellitus   . Back pain   . Thyroid disease   . Rosacea   . Ventral hernia   . Small bowel obstruction     Past Surgical History  Procedure Laterality Date  . Cholecystectomy    . Endometrial ablation    . Back surgery    . Colon surgery    . Colostomy    . Revision colostomy    . Ventral hernia repair  08/03/2012    Procedure: HERNIA REPAIR VENTRAL ADULT;  Surgeon: Gwenyth Ober, MD;  Location: [place];  Service: General;  Laterality: N/A;  . Laparotomy  08/03/2012    Procedure: EXPLORATORY LAPAROTOMY;  Surgeon: Gwenyth Ober, MD;  Location: [facility];  Service: General;  Laterality: N/A;  with extensive enterolysis  . Insertion of mesh  08/03/2012    Procedure: INSERTION OF MESH;  Surgeon: Gwenyth Ober, MD;  Location: [place];  Service: General;  Laterality: N/A;  . Carpatunnel surgery    . Liver biopsy      OB History    No data available      History   Social History  . Marital Status: Married    Spouse Name: N/A    Number of Children: N/A  . Years of Education: N/A   Social History Main Topics  . Smoking status: Never Smoker   . Smokeless tobacco: Never Used  . Alcohol Use: No  . Drug Use: No  . Sexual Activity: Yes   Other Topics Concern  . None   Social History Narrative    Family History  Problem Relation Age of Onset  . Thyroid disease Mother   . Heart disease Father   . Diabetes Mellitus I Maternal  Grandmother   . Diabetes Mellitus I Maternal Grandfather   . Diabetes Mellitus I Paternal Grandmother   . Diabetes Mellitus I Paternal Grandfather     Current outpatient prescriptions: aspirin 81 MG EC tablet, Take 81 mg by mouth every evening. , Disp: , Rfl: ;  atorvastatin (LIPITOR) 10 MG tablet, Take 10 mg by mouth at bedtime. , Disp: , Rfl: ;  Canagliflozin (INVOKANA) 300 MG TABS, Take 300 mg by mouth every morning. Before breakfast, Disp: , Rfl: ;  Cholecalciferol 5000 UNITS capsule, Take 5,000 Units by mouth every morning. Vitamin D, Disp: , Rfl:  doxycycline (VIBRA-TABS) 100 MG tablet, Take 1 tablet (100 mg total) by mouth 2 (two) times daily. For rosacea, Disp: 24 tablet, Rfl: 1;  enalapril (VASOTEC) 5 MG tablet, Take 5 mg by mouth at bedtime.  , Disp: , Rfl: ;  insulin glargine (LANTUS SOLOSTAR) 100 UNIT/ML injection, Inject 30 Units into the skin 2 (two) times daily. , Disp: , Rfl:  levothyroxine (SYNTHROID, LEVOTHROID) 150 MCG tablet, Take 150 mcg by mouth daily before breakfast., Disp: , Rfl: ;  Liraglutide (VICTOZA) 18 MG/3ML SOLN, Inject 1.8 mg into the skin at  bedtime. , Disp: , Rfl: ;  metFORMIN (GLUCOPHAGE) 1000 MG tablet, Take 1 tablet (1,000 mg total) by mouth 2 (two) times daily., Disp: , Rfl:  methocarbamol (ROBAXIN) 500 MG tablet, Take 500 mg by mouth every 8 (eight) hours as needed. For muscle spasms, Disp: , Rfl: ;  metroNIDAZOLE (METROCREAM) 0.75 % cream, Apply 1 application topically 2 (two) times daily as needed. For rosacea , Disp: , Rfl: ;  acyclovir (ZOVIRAX) 800 MG tablet, , Disp: , Rfl:  HYDROcodone-acetaminophen (NORCO/VICODIN) 5-325 MG per tablet, Take 1 tablet by mouth every 6 (six) hours as needed for pain. #30 q 4-6 hrs prn pain w/ no refills., Disp: , Rfl: ;  oxyCODONE-acetaminophen (PERCOCET/ROXICET) 5-325 MG per tablet, , Disp: , Rfl:   Review of Systems  Review of Systems  Constitutional: Negative for fever, chills, weight loss, malaise/fatigue and diaphoresis.   HENT: Negative for hearing loss, ear pain, nosebleeds, congestion, sore throat, neck pain, tinnitus and ear discharge.   Eyes: Negative for blurred vision, double vision, photophobia, pain, discharge and redness.  Respiratory: Negative for cough, hemoptysis, sputum production, shortness of breath, wheezing and stridor.   Cardiovascular: Negative for chest pain, palpitations, orthopnea, claudication, leg swelling and PND.  Gastrointestinal: negative for abdominal pain. Negative for heartburn, nausea, vomiting, diarrhea, constipation, blood in stool and melena.  Genitourinary: Negative for dysuria, urgency, frequency, hematuria and flank pain.  Musculoskeletal: Negative for myalgias, back pain, joint pain and falls.  Skin: Negative for itching and rash.  Neurological: Negative for dizziness, tingling, tremors, sensory change, speech change, focal weakness, seizures, loss of consciousness, weakness and headaches.  Endo/Heme/Allergies: Negative for environmental allergies and polydipsia. Does not bruise/bleed easily.  Psychiatric/Behavioral: Negative for depression, suicidal ideas, hallucinations, memory loss and substance abuse. The patient is not nervous/anxious and does not have insomnia.        Objective:  Blood pressure 122/70, height 5\' 6"  (1.676 m), weight 227 lb (102.967 kg).   Physical Exam  Vitals reviewed. Constitutional: She is oriented to person, place, and time. She appears well-developed and well-nourished.  HENT:  Head: Normocephalic and atraumatic.        Right Ear: External ear normal.  Left Ear: External ear normal.  Nose: Nose normal.  Mouth/Throat: Oropharynx is clear and moist.  Eyes: Conjunctivae and EOM are normal. Pupils are equal, round, and reactive to light. Right eye exhibits no discharge. Left eye exhibits no discharge. No scleral icterus.  Neck: Normal range of motion. Neck supple. No tracheal deviation present. No thyromegaly present.  Cardiovascular: Normal  rate, regular rhythm, normal heart sounds and intact distal pulses.  Exam reveals no gallop and no friction rub.   No murmur heard. Respiratory: Effort normal and breath sounds normal. No respiratory distress. She has no wheezes. She has no rales. She exhibits no tenderness.  GI: Soft. Bowel sounds are normal. She exhibits no distension and no mass. There is no tenderness. There is no rebound and no guarding.  Genitourinary:  Breasts no masses skin changes or nipple changes bilaterally      Vulva is normal without lesions Vagina is pink moist without discharge Cervix normal in appearance and pap is done Uterus is normal size shape and contour Adnexa is negative with normal sized ovaries   Musculoskeletal: Normal range of motion. She exhibits no edema and no tenderness.  Neurological: She is alert and oriented to person, place, and time. She has normal reflexes. She displays normal reflexes. No cranial nerve deficit. She exhibits normal muscle  tone. Coordination normal.  Skin: Skin is warm and dry. No rash noted. No erythema. No pallor.  Psychiatric: She has a normal mood and affect. Her behavior is normal. Judgment and thought content normal.       Assessment:     normal gyn exam .    Plan:    Mammogram ordered. Follow up in: 1 year.

## 2014-07-31 ENCOUNTER — Other Ambulatory Visit: Payer: Self-pay | Admitting: Obstetrics & Gynecology

## 2014-07-31 LAB — CYTOLOGY - PAP

## 2014-08-31 DIAGNOSIS — E1165 Type 2 diabetes mellitus with hyperglycemia: Secondary | ICD-10-CM | POA: Diagnosis not present

## 2014-08-31 DIAGNOSIS — E559 Vitamin D deficiency, unspecified: Secondary | ICD-10-CM | POA: Diagnosis not present

## 2014-08-31 DIAGNOSIS — E782 Mixed hyperlipidemia: Secondary | ICD-10-CM | POA: Diagnosis not present

## 2014-08-31 DIAGNOSIS — E1122 Type 2 diabetes mellitus with diabetic chronic kidney disease: Secondary | ICD-10-CM | POA: Diagnosis not present

## 2014-08-31 DIAGNOSIS — E038 Other specified hypothyroidism: Secondary | ICD-10-CM | POA: Diagnosis not present

## 2014-09-07 DIAGNOSIS — E1122 Type 2 diabetes mellitus with diabetic chronic kidney disease: Secondary | ICD-10-CM | POA: Diagnosis not present

## 2014-09-07 DIAGNOSIS — E782 Mixed hyperlipidemia: Secondary | ICD-10-CM | POA: Diagnosis not present

## 2014-09-07 DIAGNOSIS — E282 Polycystic ovarian syndrome: Secondary | ICD-10-CM | POA: Diagnosis not present

## 2014-09-07 DIAGNOSIS — E038 Other specified hypothyroidism: Secondary | ICD-10-CM | POA: Diagnosis not present

## 2014-09-07 DIAGNOSIS — E1165 Type 2 diabetes mellitus with hyperglycemia: Secondary | ICD-10-CM | POA: Diagnosis not present

## 2014-09-07 DIAGNOSIS — E559 Vitamin D deficiency, unspecified: Secondary | ICD-10-CM | POA: Diagnosis not present

## 2014-09-24 DIAGNOSIS — E1129 Type 2 diabetes mellitus with other diabetic kidney complication: Secondary | ICD-10-CM | POA: Diagnosis not present

## 2014-09-24 DIAGNOSIS — I10 Essential (primary) hypertension: Secondary | ICD-10-CM | POA: Diagnosis not present

## 2014-10-26 ENCOUNTER — Other Ambulatory Visit: Payer: Self-pay | Admitting: Obstetrics & Gynecology

## 2014-10-26 DIAGNOSIS — Z1231 Encounter for screening mammogram for malignant neoplasm of breast: Secondary | ICD-10-CM

## 2014-11-06 DIAGNOSIS — J019 Acute sinusitis, unspecified: Secondary | ICD-10-CM | POA: Diagnosis not present

## 2014-11-30 ENCOUNTER — Ambulatory Visit (HOSPITAL_COMMUNITY)
Admission: RE | Admit: 2014-11-30 | Discharge: 2014-11-30 | Disposition: A | Discharge status: Home / Self Care | Payer: Medicare Other | Source: Ambulatory Visit | Attending: Obstetrics & Gynecology | Admitting: Obstetrics & Gynecology

## 2014-11-30 ENCOUNTER — Inpatient Hospital Stay (HOSPITAL_COMMUNITY): Admission: RE | Admit: 2014-11-30 | Payer: Medicare Other | Source: Ambulatory Visit

## 2014-11-30 DIAGNOSIS — Z1231 Encounter for screening mammogram for malignant neoplasm of breast: Secondary | ICD-10-CM | POA: Diagnosis not present

## 2014-12-07 DIAGNOSIS — E1165 Type 2 diabetes mellitus with hyperglycemia: Secondary | ICD-10-CM | POA: Diagnosis not present

## 2014-12-07 DIAGNOSIS — E782 Mixed hyperlipidemia: Secondary | ICD-10-CM | POA: Diagnosis not present

## 2014-12-07 DIAGNOSIS — E559 Vitamin D deficiency, unspecified: Secondary | ICD-10-CM | POA: Diagnosis not present

## 2014-12-07 DIAGNOSIS — E038 Other specified hypothyroidism: Secondary | ICD-10-CM | POA: Diagnosis not present

## 2014-12-17 DIAGNOSIS — E782 Mixed hyperlipidemia: Secondary | ICD-10-CM | POA: Diagnosis not present

## 2014-12-17 DIAGNOSIS — E1122 Type 2 diabetes mellitus with diabetic chronic kidney disease: Secondary | ICD-10-CM | POA: Diagnosis not present

## 2014-12-17 DIAGNOSIS — E282 Polycystic ovarian syndrome: Secondary | ICD-10-CM | POA: Diagnosis not present

## 2014-12-17 DIAGNOSIS — E1165 Type 2 diabetes mellitus with hyperglycemia: Secondary | ICD-10-CM | POA: Diagnosis not present

## 2014-12-17 DIAGNOSIS — E038 Other specified hypothyroidism: Secondary | ICD-10-CM | POA: Diagnosis not present

## 2014-12-17 DIAGNOSIS — E559 Vitamin D deficiency, unspecified: Secondary | ICD-10-CM | POA: Diagnosis not present

## 2015-01-14 DIAGNOSIS — E119 Type 2 diabetes mellitus without complications: Secondary | ICD-10-CM | POA: Diagnosis not present

## 2015-01-14 DIAGNOSIS — Z794 Long term (current) use of insulin: Secondary | ICD-10-CM | POA: Diagnosis not present

## 2015-01-25 DIAGNOSIS — E1149 Type 2 diabetes mellitus with other diabetic neurological complication: Secondary | ICD-10-CM | POA: Diagnosis not present

## 2015-01-25 DIAGNOSIS — E785 Hyperlipidemia, unspecified: Secondary | ICD-10-CM | POA: Diagnosis not present

## 2015-03-04 DIAGNOSIS — L718 Other rosacea: Secondary | ICD-10-CM | POA: Diagnosis not present

## 2015-03-04 DIAGNOSIS — L723 Sebaceous cyst: Secondary | ICD-10-CM | POA: Diagnosis not present

## 2015-03-19 DIAGNOSIS — E038 Other specified hypothyroidism: Secondary | ICD-10-CM | POA: Diagnosis not present

## 2015-03-19 DIAGNOSIS — E1165 Type 2 diabetes mellitus with hyperglycemia: Secondary | ICD-10-CM | POA: Diagnosis not present

## 2015-03-19 DIAGNOSIS — E782 Mixed hyperlipidemia: Secondary | ICD-10-CM | POA: Diagnosis not present

## 2015-03-19 DIAGNOSIS — E559 Vitamin D deficiency, unspecified: Secondary | ICD-10-CM | POA: Diagnosis not present

## 2015-03-23 DIAGNOSIS — E559 Vitamin D deficiency, unspecified: Secondary | ICD-10-CM | POA: Diagnosis not present

## 2015-03-23 DIAGNOSIS — E038 Other specified hypothyroidism: Secondary | ICD-10-CM | POA: Diagnosis not present

## 2015-03-23 DIAGNOSIS — E1122 Type 2 diabetes mellitus with diabetic chronic kidney disease: Secondary | ICD-10-CM | POA: Diagnosis not present

## 2015-03-23 DIAGNOSIS — E782 Mixed hyperlipidemia: Secondary | ICD-10-CM | POA: Diagnosis not present

## 2015-03-23 DIAGNOSIS — E282 Polycystic ovarian syndrome: Secondary | ICD-10-CM | POA: Diagnosis not present

## 2015-03-23 DIAGNOSIS — E1165 Type 2 diabetes mellitus with hyperglycemia: Secondary | ICD-10-CM | POA: Diagnosis not present

## 2015-04-01 ENCOUNTER — Other Ambulatory Visit (HOSPITAL_COMMUNITY): Payer: Self-pay | Admitting: Neurological Surgery

## 2015-04-01 DIAGNOSIS — Z6841 Body Mass Index (BMI) 40.0 and over, adult: Secondary | ICD-10-CM | POA: Diagnosis not present

## 2015-04-01 DIAGNOSIS — M5441 Lumbago with sciatica, right side: Secondary | ICD-10-CM

## 2015-04-01 DIAGNOSIS — M5442 Lumbago with sciatica, left side: Principal | ICD-10-CM

## 2015-04-01 DIAGNOSIS — M5440 Lumbago with sciatica, unspecified side: Secondary | ICD-10-CM | POA: Diagnosis not present

## 2015-04-14 ENCOUNTER — Ambulatory Visit (HOSPITAL_COMMUNITY)
Admission: RE | Admit: 2015-04-14 | Discharge: 2015-04-14 | Disposition: A | Discharge status: Home / Self Care | Payer: Medicare Other | Source: Ambulatory Visit | Attending: Neurological Surgery | Admitting: Neurological Surgery

## 2015-04-14 DIAGNOSIS — M5441 Lumbago with sciatica, right side: Secondary | ICD-10-CM | POA: Diagnosis not present

## 2015-04-14 DIAGNOSIS — M47816 Spondylosis without myelopathy or radiculopathy, lumbar region: Secondary | ICD-10-CM | POA: Diagnosis not present

## 2015-04-14 DIAGNOSIS — M5442 Lumbago with sciatica, left side: Secondary | ICD-10-CM | POA: Diagnosis not present

## 2015-04-14 DIAGNOSIS — M5136 Other intervertebral disc degeneration, lumbar region: Secondary | ICD-10-CM | POA: Diagnosis not present

## 2015-04-14 LAB — POCT I-STAT CREATININE: CREATININE: 0.6 mg/dL (ref 0.44–1.00)

## 2015-04-14 MED ORDER — GADOBENATE DIMEGLUMINE 529 MG/ML IV SOLN
20.0000 mL | Freq: Once | INTRAVENOUS | Status: AC | PRN
  Administered 2015-04-14: 20 mL via INTRAVENOUS

## 2015-04-15 DIAGNOSIS — M5440 Lumbago with sciatica, unspecified side: Secondary | ICD-10-CM | POA: Diagnosis not present

## 2015-04-15 DIAGNOSIS — Z6835 Body mass index (BMI) 35.0-35.9, adult: Secondary | ICD-10-CM | POA: Diagnosis not present

## 2015-04-19 ENCOUNTER — Encounter: Payer: Self-pay | Admitting: Obstetrics & Gynecology

## 2015-04-19 ENCOUNTER — Other Ambulatory Visit: Payer: Self-pay | Admitting: Obstetrics & Gynecology

## 2015-04-19 ENCOUNTER — Ambulatory Visit (INDEPENDENT_AMBULATORY_CARE_PROVIDER_SITE_OTHER): Payer: Medicare Other | Admitting: Obstetrics & Gynecology

## 2015-04-19 ENCOUNTER — Other Ambulatory Visit (INDEPENDENT_AMBULATORY_CARE_PROVIDER_SITE_OTHER): Payer: Medicare Other

## 2015-04-19 VITALS — BP 110/80 | HR 76 | Wt 225.0 lb

## 2015-04-19 DIAGNOSIS — C569 Malignant neoplasm of unspecified ovary: Secondary | ICD-10-CM | POA: Diagnosis not present

## 2015-04-19 DIAGNOSIS — R1031 Right lower quadrant pain: Secondary | ICD-10-CM | POA: Diagnosis not present

## 2015-04-19 DIAGNOSIS — N8320 Unspecified ovarian cysts: Secondary | ICD-10-CM | POA: Diagnosis not present

## 2015-04-19 DIAGNOSIS — N83202 Unspecified ovarian cyst, left side: Principal | ICD-10-CM

## 2015-04-19 DIAGNOSIS — N83201 Unspecified ovarian cyst, right side: Secondary | ICD-10-CM

## 2015-04-19 MED ORDER — MEGESTROL ACETATE 40 MG PO TABS: 40.0000 mg | ORAL_TABLET | Freq: Every day | ORAL | Status: DC

## 2015-04-19 NOTE — Progress Notes (Signed)
US PELVIS TA/TV: heterogenous anteverted uterus w/a post. intramural fibroid 2.1 x 1.9 x 2.8cm,rt ov: 4.3 x 4.3 x 4.2 cm hemorrhagic cyst w/ a thick septation,rt hydrosalpinx,simple lt ov cyst 4.3 x 3.6 x 3.6cm,both ov's appears to be mobile w/venous and art flow seen,bilat adnexal pain during ultrasound,EEC 4.10mm

## 2015-04-19 NOTE — Progress Notes (Signed)
Patient ID: Nancy [name], female   DOB: July 11, 1966, 49 y.o.   MRN: [id number] Chief Complaint  Patient presents with  . Follow-up    had MRI.    Blood pressure 110/80, pulse 76, weight 225 lb (102.059 kg).  49 y.o. No obstetric history on file. No LMP recorded. Patient has had an ablation. The current method of family planning is tubal ligation.  Subjective Pt has been having RLQ pain for about 2 months Sharp, worsening Worse with movement twisting lifting History of back surgery and had MRI which revealed normal post op back and a 5 cm right ovarian complex  Objective Abdomen soft tender RLQ moderate no rebound  Pertinent ROS No burning with urination, frequency or urgency No nausea, vomiting or diarrhea Nor fever chills or other constitutional symptoms   Labs or studies US Transvaginal Non-ob  04/19/2015   GYNECOLOGIC SONOGRAM   Nancy [name] is a 49 y.o. s/p ablasion for a pelvic sonogram for pelvic  pain,rt ov cyst seen on MRI.  Uterus                      9.3 x 4.8 x 4.44 cm,  heterogenous anteverted  uterus w/a post. intramural fibroid 2.1 x 1.9 x 2.8cm  Endometrium          4.8 mm, symmetrical, wnl  Right ovary             5.8 x 5.03 x 5.2 cm, ,rt ov: 4.3 x 4.3 x 4.2 cm  hemorrhagic cyst w/ a thick septation,rt hydrosalpinx  Left ovary                5.3 x 4.5 x 3.8 cm, ,simple lt ov cyst 4.3 x 3.6  x 3.6cm  US PELVIS TA/TV: heterogenous anteverted uterus w/a post. intramural  fibroid 2.1 x 1.9 x 2.8cm,rt ov: 4.3 x 4.3 x 4.2 cm hemorrhagic cyst w/ a  thick septation,rt hydrosalpinx,simple lt ov cyst 4.3 x 3.6 x 3.6cm,both  ov's appears to be mobile w/venous and art flow seen,bilat adnexal pain  during ultrasound,EEC 4.49mm   Technician Comments:  US PELVIS TA/TV: heterogenous anteverted uterus w/a post. intramural  fibroid 2.1 x 1.9 x 2.8cm,rt ov: 4.3 x 4.3 x 4.2 cm hemorrhagic cyst w/ a  thick septation,rt hydrosalpinx,simple lt ov cyst 4.3 x 3.6 x 3.6cm,both  ov's appears to be  mobile w/venous and art flow seen,bilat adnexal pain  during ultrasound,EEC 4.80mm    Amber J Carl 04/19/2015 11:20 AM  Clinical Impression and recommendations:  I have reviewed the sonogram results above, combined with the patient's  current clinical course, below are my impressions and any appropriate  recommendations for management based on the sonographic findings.  Hemorrhagic cyst right ovary with post tubal hydrosalpinx Simple cyst left ovary Uterus clinically insignificant myoma   Sunnie Odden H 04/19/2015 11:30 AM     US Pelvis Complete  04/19/2015   GYNECOLOGIC SONOGRAM   Nancy [name] is a 49 y.o. s/p ablasion for a pelvic sonogram for pelvic  pain,rt ov cyst seen on MRI.  Uterus                      9.3 x 4.8 x 4.44 cm,  heterogenous anteverted  uterus w/a post. intramural fibroid 2.1 x 1.9 x 2.8cm  Endometrium          4.8 mm, symmetrical, wnl  Right ovary  5.8 x 5.03 x 5.2 cm, ,rt ov: 4.3 x 4.3 x 4.2 cm  hemorrhagic cyst w/ a thick septation,rt hydrosalpinx  Left ovary                5.3 x 4.5 x 3.8 cm, ,simple lt ov cyst 4.3 x 3.6  x 3.6cm  US PELVIS TA/TV: heterogenous anteverted uterus w/a post. intramural  fibroid 2.1 x 1.9 x 2.8cm,rt ov: 4.3 x 4.3 x 4.2 cm hemorrhagic cyst w/ a  thick septation,rt hydrosalpinx,simple lt ov cyst 4.3 x 3.6 x 3.6cm,both  ov's appears to be mobile w/venous and art flow seen,bilat adnexal pain  during ultrasound,EEC 4.19mm   Technician Comments:  US PELVIS TA/TV: heterogenous anteverted uterus w/a post. intramural  fibroid 2.1 x 1.9 x 2.8cm,rt ov: 4.3 x 4.3 x 4.2 cm hemorrhagic cyst w/ a  thick septation,rt hydrosalpinx,simple lt ov cyst 4.3 x 3.6 x 3.6cm,both  ov's appears to be mobile w/venous and art flow seen,bilat adnexal pain  during ultrasound,EEC 4.40mm    Amber J Carl 04/19/2015 11:20 AM  Clinical Impression and recommendations:  I have reviewed the sonogram results above, combined with the patient's  current clinical course, below are my impressions  and any appropriate  recommendations for management based on the sonographic findings.  Hemorrhagic cyst right ovary with post tubal hydrosalpinx Simple cyst left ovary Uterus clinically insignificant myoma   Maximos Zayas H 04/19/2015 11:30 AM        Impression Diagnoses this Encounter::   ICD-9-CM ICD-10-CM   1. Bilateral ovarian cysts 620.2 N83.20   2. Ovarian cancer, unspecified laterality 183.0 C56.9 CA 125     US Pelvis Complete     US Transvaginal Non-OB  3. RLQ abdominal pain 789.03 R10.31     Established relevant diagnosis(es):   Plan/Recommendations Meds ordered this encounter  Medications  . megestrol (MEGACE) 40 MG tablet    Sig: Take 1 tablet (40 mg total) by mouth daily.    Dispense:  30 tablet    Refill:  2    Orders Placed This Encounter  Procedures  . US Pelvis Complete  . US Transvaginal Non-OB  . CA 125    Will start megestrol if CA 125 normal See if we can suppress that side if not will need laparoscopic removal of both ovaries given her age  Follow up Return in about 2 months (around 06/19/2015) for Follow up, with Dr Elonda Husky.     Face to face time:  20 minutes  Greater than 50% of the visit time was spent in counseling and coordination of care with the patient.  The summary and outline of the counseling and care coordination is summarized in the note above.   All questions were answered.

## 2015-04-20 LAB — CA 125: CA 125: 12.2 U/mL (ref 0.0–38.1)

## 2015-05-06 ENCOUNTER — Ambulatory Visit (INDEPENDENT_AMBULATORY_CARE_PROVIDER_SITE_OTHER): Payer: Medicare Other | Admitting: Obstetrics & Gynecology

## 2015-05-06 ENCOUNTER — Encounter: Payer: Self-pay | Admitting: Obstetrics & Gynecology

## 2015-05-06 VITALS — BP 120/80 | HR 80 | Wt 224.4 lb

## 2015-05-06 DIAGNOSIS — R1031 Right lower quadrant pain: Secondary | ICD-10-CM | POA: Diagnosis not present

## 2015-05-06 DIAGNOSIS — N83202 Unspecified ovarian cyst, left side: Principal | ICD-10-CM

## 2015-05-06 DIAGNOSIS — N8320 Unspecified ovarian cysts: Secondary | ICD-10-CM | POA: Diagnosis not present

## 2015-05-06 DIAGNOSIS — N83201 Unspecified ovarian cyst, right side: Secondary | ICD-10-CM

## 2015-05-10 ENCOUNTER — Encounter (HOSPITAL_COMMUNITY): Payer: Self-pay

## 2015-05-10 ENCOUNTER — Encounter (HOSPITAL_COMMUNITY)
Admission: RE | Admit: 2015-05-10 | Discharge: 2015-05-10 | Disposition: A | Discharge status: Home / Self Care | Payer: Medicare Other | Source: Ambulatory Visit | Attending: Obstetrics & Gynecology | Admitting: Obstetrics & Gynecology

## 2015-05-10 DIAGNOSIS — N8329 Other ovarian cysts: Secondary | ICD-10-CM | POA: Diagnosis not present

## 2015-05-10 DIAGNOSIS — N7011 Chronic salpingitis: Secondary | ICD-10-CM | POA: Diagnosis not present

## 2015-05-10 DIAGNOSIS — Z01810 Encounter for preprocedural cardiovascular examination: Secondary | ICD-10-CM | POA: Diagnosis not present

## 2015-05-10 DIAGNOSIS — Z794 Long term (current) use of insulin: Secondary | ICD-10-CM | POA: Diagnosis not present

## 2015-05-10 DIAGNOSIS — Z7982 Long term (current) use of aspirin: Secondary | ICD-10-CM | POA: Diagnosis not present

## 2015-05-10 DIAGNOSIS — E119 Type 2 diabetes mellitus without complications: Secondary | ICD-10-CM | POA: Diagnosis not present

## 2015-05-10 DIAGNOSIS — Z79899 Other long term (current) drug therapy: Secondary | ICD-10-CM | POA: Diagnosis not present

## 2015-05-10 DIAGNOSIS — Z01812 Encounter for preprocedural laboratory examination: Secondary | ICD-10-CM | POA: Diagnosis not present

## 2015-05-10 DIAGNOSIS — N839 Noninflammatory disorder of ovary, fallopian tube and broad ligament, unspecified: Secondary | ICD-10-CM | POA: Diagnosis not present

## 2015-05-10 HISTORY — DX: Other specified postprocedural states: Z98.890

## 2015-05-10 HISTORY — DX: Other complications of anesthesia, initial encounter: T88.59XA

## 2015-05-10 HISTORY — DX: Adverse effect of unspecified anesthetic, initial encounter: T41.45XA

## 2015-05-10 HISTORY — DX: Other specified postprocedural states: R11.2

## 2015-05-10 LAB — COMPREHENSIVE METABOLIC PANEL
ALBUMIN: 4.1 g/dL (ref 3.5–5.0)
ALT: 25 U/L (ref 14–54)
ANION GAP: 9 (ref 5–15)
AST: 25 U/L (ref 15–41)
Alkaline Phosphatase: 56 U/L (ref 38–126)
BILIRUBIN TOTAL: 0.9 mg/dL (ref 0.3–1.2)
BUN: 11 mg/dL (ref 6–20)
CALCIUM: 8.8 mg/dL — AB (ref 8.9–10.3)
CO2: 21 mmol/L — ABNORMAL LOW (ref 22–32)
Chloride: 105 mmol/L (ref 101–111)
Creatinine, Ser: 0.52 mg/dL (ref 0.44–1.00)
GFR calc non Af Amer: >60 mL/min (ref >60)
GLUCOSE: 149 mg/dL — AB (ref 65–99)
POTASSIUM: 3.8 mmol/L (ref 3.5–5.1)
Sodium: 135 mmol/L (ref 135–145)
TOTAL PROTEIN: 6.9 g/dL (ref 6.5–8.1)

## 2015-05-10 LAB — CBC
HEMATOCRIT: 43.9 % (ref 36.0–46.0)
Hemoglobin: 14.9 g/dL (ref 12.0–15.0)
MCH: 31 pg (ref 26.0–34.0)
MCHC: 33.9 g/dL (ref 30.0–36.0)
MCV: 91.5 fL (ref 78.0–100.0)
Platelets: 263 10*3/uL (ref 150–400)
RBC: 4.8 MIL/uL (ref 3.87–5.11)
RDW: 13.4 % (ref 11.5–15.5)
WBC: 10.2 10*3/uL (ref 4.0–10.5)

## 2015-05-10 LAB — TYPE AND SCREEN
ABO/RH(D): A POS
ANTIBODY SCREEN: NEGATIVE

## 2015-05-10 NOTE — Patient Instructions (Signed)
Nancy [name]  05/10/2015     @PREFPERIOPPHARMACY @   Your procedure is scheduled on 05/12/2015.  Report to [name] at 9:10 A.M.  Call this number if you have problems the morning of surgery:  [phone]   Remember:  Do not eat food or drink liquids after midnight.  Take these medicines the morning of surgery with A SIP OF WATER Vasotec, Hydrocodone, Megace, Synthroid, Robaxin   Do not wear jewelry, make-up or nail polish.  Do not wear lotions, powders, or perfumes.  You may wear deodorant.  Do not shave 48 hours prior to surgery.  Men may shave face and neck.  Do not bring valuables to the hospital.  [facility] is not responsible for any belongings or valuables.  Contacts, dentures or bridgework may not be worn into surgery.  Leave your suitcase in the car.  After surgery it may be brought to your room.  For patients admitted to the hospital, discharge time will be determined by your treatment team.  Patients discharged the day of surgery will not be allowed to drive home.   Please read over the following fact sheets that you were given. Surgical Site Infection Prevention and Anesthesia Post-op Instructions     PATIENT INSTRUCTIONS POST-ANESTHESIA  IMMEDIATELY FOLLOWING SURGERY:  Do not drive or operate machinery for the first twenty four hours after surgery.  Do not make any important decisions for twenty four hours after surgery or while taking narcotic pain medications or sedatives.  If you develop intractable nausea and vomiting or a severe headache please notify your doctor immediately.  FOLLOW-UP:  Please make an appointment with your surgeon as instructed. You do not need to follow up with anesthesia unless specifically instructed to do so.  WOUND CARE INSTRUCTIONS (if applicable):  Keep a dry clean dressing on the anesthesia/puncture wound site if there is drainage.  Once the wound has quit draining you may leave it open to air.  Generally you should leave the  bandage intact for twenty four hours unless there is drainage.  If the epidural site drains for more than 36-48 hours please call the anesthesia department.  QUESTIONS?:  Please feel free to call your physician or the hospital operator if you have any questions, and they will be happy to assist you.      Bilateral Salpingo-Oophorectomy, Care After Refer to this sheet in the next few weeks. These instructions provide you with information on caring for yourself after your procedure. Your health care provider may also give you more specific instructions. Your treatment has been planned according to current medical practices, but problems sometimes occur. Call your health care provider if you have any problems or questions after your procedure. WHAT TO EXPECT AFTER THE PROCEDURE After your procedure, it is typical to have the following:   Abdominal pain that can be controlled with medicine.  Vaginal spotting.  Constipation.  Menopausal symptoms such as hot flashes, vaginal dryness, and mood swings. HOME CARE INSTRUCTIONS   Get plenty of rest and sleep.  Only take over-the-counter or prescription medicines as directed by your health care provider. Do not take aspirin. It can cause bleeding.  Keep incision areas clean and dry. Remove or change bandages (dressings) only as directed by your health care provider.  Take showers instead of baths for a few weeks as directed by your health care provider.  Limit exercise and activities as directed by your health care provider. Do not lift anything heavier than 5 pounds (  2.3 kg) until your health care provider approves.  Do not drive until your health care provider approves.  Follow your health care provider's advice regarding diet. You may be able to resume your usual diet right away.  Drink enough fluids to keep your urine clear or pale yellow.  Do not douche, use tampons, or have sexual intercourse for 6 weeks after the procedure.  Do not  drink alcohol until your health care provider says it is okay.  Take your temperature twice a day and write it down.  If you become constipated, you may:  Ask your health care provider about taking a mild laxative.  Add more fruit and bran to your diet.  Drink more fluids.  Follow up with your health care provider as directed. SEEK MEDICAL CARE IF:   You have swelling, redness, or increasing pain in the incision area.  You see pus coming from the incision area.  You notice a bad smell coming from the wound or dressing.  You have pain, redness, or swelling where the IV access tube was placed.  Your incision is breaking open (the edges are not staying together).  You feel dizzy or feel like fainting.  You develop pain or bleeding when you urinate.  You develop diarrhea.  You develop nausea and vomiting.  You develop abnormal vaginal discharge.  You develop a rash.  You have pain that is not controlled with medicine. SEEK IMMEDIATE MEDICAL CARE IF:   You develop a fever.  You develop abdominal pain.  You have chest pain.  You develop shortness of breath.  You pass out.  You develop pain, swelling, or redness in your leg.  You develop heavy vaginal bleeding with or without blood clots. Document Released: 08/07/2005 Document Revised: 04/09/2013 Document Reviewed: 01/29/2013 [facility] Patient Information 2015 [place], [place]. This information is not intended to replace advice given to you by your health care provider. Make sure you discuss any questions you have with your health care provider.

## 2015-05-12 ENCOUNTER — Ambulatory Visit (HOSPITAL_COMMUNITY): Payer: Medicare Other | Admitting: Anesthesiology

## 2015-05-12 ENCOUNTER — Encounter (HOSPITAL_COMMUNITY)
Admission: RE | Disposition: A | Discharge status: Home / Self Care | Payer: Self-pay | Source: Ambulatory Visit | Attending: Obstetrics & Gynecology

## 2015-05-12 ENCOUNTER — Encounter (HOSPITAL_COMMUNITY): Payer: Self-pay | Admitting: *Deleted

## 2015-05-12 ENCOUNTER — Ambulatory Visit (HOSPITAL_COMMUNITY)
Admission: RE | Admit: 2015-05-12 | Discharge: 2015-05-12 | Disposition: A | Discharge status: Home / Self Care | Payer: Medicare Other | Source: Ambulatory Visit | Attending: Obstetrics & Gynecology | Admitting: Obstetrics & Gynecology

## 2015-05-12 DIAGNOSIS — R1031 Right lower quadrant pain: Secondary | ICD-10-CM | POA: Diagnosis not present

## 2015-05-12 DIAGNOSIS — N736 Female pelvic peritoneal adhesions (postinfective): Secondary | ICD-10-CM | POA: Diagnosis not present

## 2015-05-12 DIAGNOSIS — N830 Follicular cyst of ovary: Secondary | ICD-10-CM | POA: Diagnosis not present

## 2015-05-12 DIAGNOSIS — N839 Noninflammatory disorder of ovary, fallopian tube and broad ligament, unspecified: Secondary | ICD-10-CM | POA: Insufficient documentation

## 2015-05-12 DIAGNOSIS — N7011 Chronic salpingitis: Secondary | ICD-10-CM | POA: Diagnosis not present

## 2015-05-12 DIAGNOSIS — Z01812 Encounter for preprocedural laboratory examination: Secondary | ICD-10-CM | POA: Diagnosis not present

## 2015-05-12 DIAGNOSIS — Z794 Long term (current) use of insulin: Secondary | ICD-10-CM | POA: Insufficient documentation

## 2015-05-12 DIAGNOSIS — N8329 Other ovarian cysts: Secondary | ICD-10-CM | POA: Diagnosis not present

## 2015-05-12 DIAGNOSIS — E119 Type 2 diabetes mellitus without complications: Secondary | ICD-10-CM | POA: Insufficient documentation

## 2015-05-12 DIAGNOSIS — Z7982 Long term (current) use of aspirin: Secondary | ICD-10-CM | POA: Insufficient documentation

## 2015-05-12 DIAGNOSIS — N8320 Unspecified ovarian cysts: Secondary | ICD-10-CM | POA: Diagnosis not present

## 2015-05-12 DIAGNOSIS — Z01810 Encounter for preprocedural cardiovascular examination: Secondary | ICD-10-CM | POA: Diagnosis not present

## 2015-05-12 DIAGNOSIS — Z79899 Other long term (current) drug therapy: Secondary | ICD-10-CM | POA: Insufficient documentation

## 2015-05-12 HISTORY — PX: LAPAROSCOPIC LYSIS OF ADHESIONS: SHX5905

## 2015-05-12 HISTORY — PX: LAPAROSCOPIC BILATERAL SALPINGO OOPHERECTOMY: SHX5890

## 2015-05-12 LAB — GLUCOSE, CAPILLARY: GLUCOSE-CAPILLARY: 146 mg/dL — AB (ref 65–99)

## 2015-05-12 SURGERY — SALPINGO-OOPHORECTOMY, BILATERAL, LAPAROSCOPIC
Anesthesia: General | Site: Abdomen | Laterality: Bilateral

## 2015-05-12 MED ORDER — FENTANYL CITRATE (PF) 250 MCG/5ML IJ SOLN
INTRAMUSCULAR | Status: AC
  Filled 2015-05-12: qty 25

## 2015-05-12 MED ORDER — FENTANYL CITRATE (PF) 100 MCG/2ML IJ SOLN
INTRAMUSCULAR | Status: DC | PRN
  Administered 2015-05-12 (×2): 25 ug via INTRAVENOUS
  Administered 2015-05-12: 50 ug via INTRAVENOUS
  Administered 2015-05-12 (×2): 25 ug via INTRAVENOUS
  Administered 2015-05-12 (×2): 50 ug via INTRAVENOUS
  Administered 2015-05-12: 100 ug via INTRAVENOUS

## 2015-05-12 MED ORDER — SODIUM CHLORIDE 0.9 % IR SOLN
Status: DC | PRN
Start: 2015-05-12 — End: 2015-05-12
  Administered 2015-05-12: 1000 mL

## 2015-05-12 MED ORDER — ONDANSETRON HCL 4 MG/2ML IJ SOLN: 4.0000 mg | Freq: Once | INTRAMUSCULAR | Status: DC | PRN

## 2015-05-12 MED ORDER — FENTANYL CITRATE (PF) 100 MCG/2ML IJ SOLN
INTRAMUSCULAR | Status: AC
  Filled 2015-05-12: qty 4

## 2015-05-12 MED ORDER — GLYCOPYRROLATE 0.2 MG/ML IJ SOLN
0.2000 mg | Freq: Once | INTRAMUSCULAR | Status: AC
  Administered 2015-05-12: 0.2 mg via INTRAVENOUS

## 2015-05-12 MED ORDER — BUPIVACAINE LIPOSOME 1.3 % IJ SUSP
INTRAMUSCULAR | Status: AC
  Filled 2015-05-12: qty 20

## 2015-05-12 MED ORDER — GLYCOPYRROLATE 0.2 MG/ML IJ SOLN
INTRAMUSCULAR | Status: AC
  Filled 2015-05-12: qty 3

## 2015-05-12 MED ORDER — DEXAMETHASONE SODIUM PHOSPHATE 4 MG/ML IJ SOLN
INTRAMUSCULAR | Status: AC
  Filled 2015-05-12: qty 1

## 2015-05-12 MED ORDER — THROMBIN 5000 UNITS EX SOLR
OROMUCOSAL | Status: DC | PRN
  Administered 2015-05-12: 5 mL via TOPICAL

## 2015-05-12 MED ORDER — ROCURONIUM BROMIDE 50 MG/5ML IV SOLN
INTRAVENOUS | Status: AC
  Filled 2015-05-12: qty 1

## 2015-05-12 MED ORDER — ONDANSETRON HCL 4 MG/2ML IJ SOLN
INTRAMUSCULAR | Status: AC
Start: 2015-05-12 — End: 2015-05-12
  Filled 2015-05-12: qty 2

## 2015-05-12 MED ORDER — PROPOFOL 10 MG/ML IV BOLUS
INTRAVENOUS | Status: AC
  Filled 2015-05-12: qty 20

## 2015-05-12 MED ORDER — SCOPOLAMINE 1 MG/3DAYS TD PT72
1.0000 | MEDICATED_PATCH | Freq: Once | TRANSDERMAL | Status: DC
  Administered 2015-05-12: 1.5 mg via TRANSDERMAL

## 2015-05-12 MED ORDER — DEXAMETHASONE SODIUM PHOSPHATE 4 MG/ML IJ SOLN
4.0000 mg | Freq: Once | INTRAMUSCULAR | Status: AC
  Administered 2015-05-12: 4 mg via INTRAVENOUS

## 2015-05-12 MED ORDER — THROMBIN 5000 UNITS EX SOLR
CUTANEOUS | Status: AC
  Filled 2015-05-12: qty 5000

## 2015-05-12 MED ORDER — ONDANSETRON HCL 4 MG/2ML IJ SOLN
4.0000 mg | Freq: Once | INTRAMUSCULAR | Status: AC
  Administered 2015-05-12: 4 mg via INTRAVENOUS

## 2015-05-12 MED ORDER — KETOROLAC TROMETHAMINE 30 MG/ML IJ SOLN
30.0000 mg | Freq: Once | INTRAMUSCULAR | Status: AC
  Administered 2015-05-12: 30 mg via INTRAVENOUS

## 2015-05-12 MED ORDER — MIDAZOLAM HCL 2 MG/2ML IJ SOLN
INTRAMUSCULAR | Status: AC
  Filled 2015-05-12: qty 2

## 2015-05-12 MED ORDER — GLYCOPYRROLATE 0.2 MG/ML IJ SOLN
INTRAMUSCULAR | Status: AC
  Filled 2015-05-12: qty 1

## 2015-05-12 MED ORDER — FENTANYL CITRATE (PF) 100 MCG/2ML IJ SOLN
25.0000 ug | INTRAMUSCULAR | Status: DC | PRN
  Administered 2015-05-12 (×4): 50 ug via INTRAVENOUS
  Filled 2015-05-12 (×2): qty 2

## 2015-05-12 MED ORDER — LIDOCAINE HCL (CARDIAC) 10 MG/ML IV SOLN
INTRAVENOUS | Status: DC | PRN
  Administered 2015-05-12: 50 mg via INTRAVENOUS

## 2015-05-12 MED ORDER — MIDAZOLAM HCL 2 MG/2ML IJ SOLN
1.0000 mg | INTRAMUSCULAR | Status: DC | PRN
  Administered 2015-05-12: 2 mg via INTRAVENOUS

## 2015-05-12 MED ORDER — SODIUM CHLORIDE 0.9 % IR SOLN
Status: DC | PRN
  Administered 2015-05-12: 1000 mL

## 2015-05-12 MED ORDER — PROPOFOL 10 MG/ML IV BOLUS
INTRAVENOUS | Status: DC | PRN
  Administered 2015-05-12: 150 mg via INTRAVENOUS
  Administered 2015-05-12: 50 mg via INTRAVENOUS

## 2015-05-12 MED ORDER — ROCURONIUM BROMIDE 100 MG/10ML IV SOLN
INTRAVENOUS | Status: DC | PRN
  Administered 2015-05-12: 10 mg via INTRAVENOUS
  Administered 2015-05-12 (×2): 5 mg via INTRAVENOUS
  Administered 2015-05-12: 10 mg via INTRAVENOUS
  Administered 2015-05-12: 35 mg via INTRAVENOUS
  Administered 2015-05-12: 10 mg via INTRAVENOUS
  Administered 2015-05-12: 5 mg via INTRAVENOUS

## 2015-05-12 MED ORDER — SCOPOLAMINE 1 MG/3DAYS TD PT72
MEDICATED_PATCH | TRANSDERMAL | Status: AC
  Filled 2015-05-12: qty 1

## 2015-05-12 MED ORDER — LACTATED RINGERS IV SOLN
INTRAVENOUS | Status: DC
  Administered 2015-05-12 (×2): via INTRAVENOUS

## 2015-05-12 MED ORDER — GLYCOPYRROLATE 0.2 MG/ML IJ SOLN
INTRAMUSCULAR | Status: DC | PRN
  Administered 2015-05-12: .8 mg via INTRAVENOUS

## 2015-05-12 MED ORDER — ONDANSETRON HCL 8 MG PO TABS: 8.0000 mg | ORAL_TABLET | Freq: Three times a day (TID) | ORAL | Status: DC | PRN

## 2015-05-12 MED ORDER — BUPIVACAINE LIPOSOME 1.3 % IJ SUSP
20.0000 mL | Freq: Once | INTRAMUSCULAR | Status: DC
  Filled 2015-05-12: qty 20

## 2015-05-12 MED ORDER — CEFOTETAN DISODIUM-DEXTROSE 2-2.08 GM-% IV SOLR
2.0000 g | INTRAVENOUS | Status: AC
  Administered 2015-05-12: 2 g via INTRAVENOUS

## 2015-05-12 MED ORDER — LIDOCAINE HCL (PF) 1 % IJ SOLN
INTRAMUSCULAR | Status: AC
  Filled 2015-05-12: qty 5

## 2015-05-12 MED ORDER — BUPIVACAINE LIPOSOME 1.3 % IJ SUSP
INTRAMUSCULAR | Status: DC | PRN
  Administered 2015-05-12: 20 mL

## 2015-05-12 MED ORDER — CEFOTETAN DISODIUM-DEXTROSE 2-2.08 GM-% IV SOLR
INTRAVENOUS | Status: AC
  Filled 2015-05-12: qty 50

## 2015-05-12 MED ORDER — KETOROLAC TROMETHAMINE 10 MG PO TABS: 10.0000 mg | ORAL_TABLET | Freq: Three times a day (TID) | ORAL | Status: DC | PRN

## 2015-05-12 MED ORDER — NEOSTIGMINE METHYLSULFATE 10 MG/10ML IV SOLN
INTRAVENOUS | Status: DC | PRN
  Administered 2015-05-12: 4 mg via INTRAVENOUS

## 2015-05-12 MED ORDER — OXYCODONE-ACETAMINOPHEN 7.5-325 MG PO TABS: 1.0000 | ORAL_TABLET | Freq: Four times a day (QID) | ORAL | Status: DC | PRN

## 2015-05-12 MED ORDER — KETOROLAC TROMETHAMINE 30 MG/ML IJ SOLN
INTRAMUSCULAR | Status: AC
  Filled 2015-05-12: qty 1

## 2015-05-12 SURGICAL SUPPLY — 44 items
BAG HAMPER (MISCELLANEOUS) ×2 IMPLANT
BAG SPEC RTRVL LRG 6X4 10 (ENDOMECHANICALS) ×2
BLADE SURG SZ11 CARB STEEL (BLADE) ×2 IMPLANT
CLOTH BEACON ORANGE TIMEOUT ST (SAFETY) ×2 IMPLANT
COVER LIGHT HANDLE STERIS (MISCELLANEOUS) ×4 IMPLANT
ELECT REM PT RETURN 9FT ADLT (ELECTROSURGICAL) ×2
ELECTRODE REM PT RTRN 9FT ADLT (ELECTROSURGICAL) ×1 IMPLANT
FILTER SMOKE EVAC LAPAROSHD (FILTER) ×2 IMPLANT
FORMALIN 10 PREFIL 120ML (MISCELLANEOUS) ×4 IMPLANT
GLOVE BIOGEL PI IND STRL 7.0 (GLOVE) ×2 IMPLANT
GLOVE BIOGEL PI IND STRL 8 (GLOVE) ×1 IMPLANT
GLOVE BIOGEL PI INDICATOR 7.0 (GLOVE) ×2
GLOVE BIOGEL PI INDICATOR 8 (GLOVE) ×1
GLOVE ECLIPSE 6.5 STRL STRAW (GLOVE) ×2 IMPLANT
GLOVE ECLIPSE 8.0 STRL XLNG CF (GLOVE) ×4 IMPLANT
GOWN STRL REUS W/TWL LRG LVL3 (GOWN DISPOSABLE) ×2 IMPLANT
GOWN STRL REUS W/TWL XL LVL3 (GOWN DISPOSABLE) ×2 IMPLANT
INST SET LAPROSCOPIC GYN AP (KITS) ×2 IMPLANT
IV NS IRRIG 3000ML ARTHROMATIC (IV SOLUTION) IMPLANT
KIT ROOM TURNOVER APOR (KITS) ×2 IMPLANT
MANIFOLD NEPTUNE II (INSTRUMENTS) ×2 IMPLANT
NEEDLE HYPO 18GX1.5 BLUNT FILL (NEEDLE) ×2 IMPLANT
NEEDLE HYPO 21X1.5 SAFETY (NEEDLE) ×2 IMPLANT
NEEDLE INSUFFLATION 120MM (ENDOMECHANICALS) ×2 IMPLANT
PACK PERI GYN (CUSTOM PROCEDURE TRAY) ×2 IMPLANT
PAD ARMBOARD 7.5X6 YLW CONV (MISCELLANEOUS) ×2 IMPLANT
POUCH SPECIMEN RETRIEVAL 10MM (ENDOMECHANICALS) ×4 IMPLANT
SCALPEL HARMONIC ACE (MISCELLANEOUS) ×2 IMPLANT
SET BASIN LINEN APH (SET/KITS/TRAYS/PACK) ×2 IMPLANT
SET TUBE IRRIG SUCTION NO TIP (IRRIGATION / IRRIGATOR) ×2 IMPLANT
SOLUTION ANTI FOG 6CC (MISCELLANEOUS) ×2 IMPLANT
SPONGE GAUZE 2X2 8PLY STRL LF (GAUZE/BANDAGES/DRESSINGS) ×6 IMPLANT
SPONGE SURGIFOAM ABS GEL 100 (HEMOSTASIS) ×2 IMPLANT
STAPLER VISISTAT 35W (STAPLE) ×2 IMPLANT
SUT VICRYL 0 UR6 27IN ABS (SUTURE) ×4 IMPLANT
SYR 20CC LL (SYRINGE) ×2 IMPLANT
SYR CONTROL 10ML LL (SYRINGE) ×2 IMPLANT
TAPE CLOTH SURG 4X10 WHT LF (GAUZE/BANDAGES/DRESSINGS) ×2 IMPLANT
TRAY FOLEY CATH SILVER 16FR (SET/KITS/TRAYS/PACK) ×2 IMPLANT
TROCAR XCEL NON-BLD 11X100MML (ENDOMECHANICALS) ×2 IMPLANT
TROCAR XCEL NON-BLD 5MMX100MML (ENDOMECHANICALS) ×2 IMPLANT
TROCAR XCEL OPT SLVE 5M 100M (ENDOMECHANICALS) ×2 IMPLANT
TUBING INSUF HEATED (TUBING) ×2 IMPLANT
WARMER LAPAROSCOPE (MISCELLANEOUS) ×2 IMPLANT

## 2015-05-12 NOTE — H&P (Signed)
Patient presents with  . Follow-up    had MRI.    Blood pressure 110/80, pulse 76, weight 225 lb (102.059 kg).  49 y.o. No obstetric history on file. No LMP recorded. Patient has had an ablation. The current method of family planning is tubal ligation.  Subjective Pt has been having RLQ pain for about 2 months Sharp, worsening Worse with movement twisting lifting History of back surgery and had MRI which revealed normal post op back and a 5 cm right ovarian complex  Objective Abdomen soft tender RLQ moderate no rebound  Pertinent ROS No burning with urination, frequency or urgency No nausea, vomiting or diarrhea Nor fever chills or other constitutional symptoms   Labs or studies  Imaging Results (Last 48 hours)    US Transvaginal Non-ob  04/19/2015 GYNECOLOGIC SONOGRAM TRANISE FORREST is a 49 y.o. s/p ablasion for a pelvic sonogram for pelvic pain,rt ov cyst seen on MRI. Uterus 9.3 x 4.8 x 4.44 cm, heterogenous anteverted uterus w/a post. intramural fibroid 2.1 x 1.9 x 2.8cm Endometrium 4.8 mm, symmetrical, wnl Right ovary 5.8 x 5.03 x 5.2 cm, ,rt ov: 4.3 x 4.3 x 4.2 cm hemorrhagic cyst w/ a thick septation,rt hydrosalpinx Left ovary 5.3 x 4.5 x 3.8 cm, ,simple lt ov cyst 4.3 x 3.6 x 3.6cm US PELVIS TA/TV: heterogenous anteverted uterus w/a post. intramural fibroid 2.1 x 1.9 x 2.8cm,rt ov: 4.3 x 4.3 x 4.2 cm hemorrhagic cyst w/ a thick septation,rt hydrosalpinx,simple lt ov cyst 4.3 x 3.6 x 3.6cm,both ov's appears to be mobile w/venous and art flow seen,bilat adnexal pain during ultrasound,EEC 4.60mm Technician Comments: US PELVIS TA/TV: heterogenous anteverted uterus w/a post. intramural fibroid 2.1 x 1.9 x 2.8cm,rt ov: 4.3 x 4.3 x 4.2 cm hemorrhagic cyst w/ a thick septation,rt hydrosalpinx,simple lt ov cyst 4.3 x 3.6 x 3.6cm,both ov's appears to be mobile w/venous and art flow seen,bilat adnexal  pain during ultrasound,EEC 4.65mm Amber J Carl 04/19/2015 11:20 AM Clinical Impression and recommendations: I have reviewed the sonogram results above, combined with the patient's current clinical course, below are my impressions and any appropriate recommendations for management based on the sonographic findings. Hemorrhagic cyst right ovary with post tubal hydrosalpinx Simple cyst left ovary Uterus clinically insignificant myoma EURE,LUTHER H 04/19/2015 11:30 AM   US Pelvis Complete  04/19/2015 GYNECOLOGIC SONOGRAM Nancy [name] is a 49 y.o. s/p ablasion for a pelvic sonogram for pelvic pain,rt ov cyst seen on MRI. Uterus 9.3 x 4.8 x 4.44 cm, heterogenous anteverted uterus w/a post. intramural fibroid 2.1 x 1.9 x 2.8cm Endometrium 4.8 mm, symmetrical, wnl Right ovary 5.8 x 5.03 x 5.2 cm, ,rt ov: 4.3 x 4.3 x 4.2 cm hemorrhagic cyst w/ a thick septation,rt hydrosalpinx Left ovary 5.3 x 4.5 x 3.8 cm, ,simple lt ov cyst 4.3 x 3.6 x 3.6cm US PELVIS TA/TV: heterogenous anteverted uterus w/a post. intramural fibroid 2.1 x 1.9 x 2.8cm,rt ov: 4.3 x 4.3 x 4.2 cm hemorrhagic cyst w/ a thick septation,rt hydrosalpinx,simple lt ov cyst 4.3 x 3.6 x 3.6cm,both ov's appears to be mobile w/venous and art flow seen,bilat adnexal pain during ultrasound,EEC 4.57mm Technician Comments: US PELVIS TA/TV: heterogenous anteverted uterus w/a post. intramural fibroid 2.1 x 1.9 x 2.8cm,rt ov: 4.3 x 4.3 x 4.2 cm hemorrhagic cyst w/ a thick septation,rt hydrosalpinx,simple lt ov cyst 4.3 x 3.6 x 3.6cm,both ov's appears to be mobile w/venous and art flow seen,bilat adnexal pain during ultrasound,EEC 4.59mm [employer] 04/19/2015 11:20 AM  Clinical Impression and recommendations: I have reviewed the sonogram results above, combined with the patient's current clinical course, below are my impressions and any appropriate recommendations for  management based on the sonographic findings. Hemorrhagic cyst right ovary with post tubal hydrosalpinx Simple cyst left ovary Uterus clinically insignificant myoma EURE,LUTHER H 04/19/2015 11:30 AM       Impression Diagnoses this Encounter::   ICD-9-CM ICD-10-CM   1. Bilateral ovarian cysts 620.2 N83.20   2. Ovarian cancer, unspecified laterality 183.0 C56.9 CA 125     US Pelvis Complete     US Transvaginal Non-OB  3. RLQ abdominal pain 789.03 R10.31     Established relevant diagnosis(es):   Plan/Recommendations Meds ordered this encounter  Medications  . megestrol (MEGACE) 40 MG tablet    Sig: Take 1 tablet (40 mg total) by mouth daily.    Dispense: 30 tablet    Refill: 2    Orders Placed This Encounter  Procedures  . US Pelvis Complete  . US Transvaginal Non-OB  . CA 125    Will start megestrol if CA 125 normal See if we can suppress that side if not will need laparoscopic removal of both ovaries given her age         GYNECOLOGIC SONOGRAM   Nancy [name] is a 49 y.o. s/p ablasion for a pelvic sonogram for pelvic pain,rt ov cyst seen on MRI.  Uterus 9.3 x 4.8 x 4.44 cm, heterogenous anteverted uterus w/a post. intramural fibroid 2.1 x 1.9 x 2.8cm  Endometrium 4.8 mm, symmetrical, wnl  Right ovary 5.8 x 5.03 x 5.2 cm, ,rt ov: 4.3 x 4.3 x 4.2 cm hemorrhagic cyst w/ a thick septation,rt hydrosalpinx  Left ovary 5.3 x 4.5 x 3.8 cm, ,simple lt ov cyst 4.3 x 3.6 x 3.6cm  US PELVIS TA/TV: heterogenous anteverted uterus w/a post. intramural fibroid 2.1 x 1.9 x 2.8cm,rt ov: 4.3 x 4.3 x 4.2 cm hemorrhagic cyst w/ a thick septation,rt hydrosalpinx,simple lt ov cyst 4.3 x 3.6 x 3.6cm,both ov's appears to be mobile w/venous and art flow seen,bilat adnexal pain during ultrasound,EEC 4.80mm   Technician Comments:  US PELVIS TA/TV: heterogenous anteverted  uterus w/a post. intramural fibroid 2.1 x 1.9 x 2.8cm,rt ov: 4.3 x 4.3 x 4.2 cm hemorrhagic cyst w/ a thick septation,rt hydrosalpinx,simple lt ov cyst 4.3 x 3.6 x 3.6cm,both ov's appears to be mobile w/venous and art flow seen,bilat adnexal pain during ultrasound,EEC 4.65mm    Amber J Carl 04/19/2015 11:20 AM  Clinical Impression and recommendations:  I have reviewed the sonogram results above, combined with the patient's current clinical course, below are my impressions and any appropriate recommendations for management based on the sonographic findings.  Hemorrhagic cyst right ovary with post tubal hydrosalpinx Simple cyst left ovary Uterus clinically insignificant myoma   EURE,LUTHER H 04/19/2015 11:30 AM   Preoperative History and Physical  Nancy [name] is a 49 y.o. No obstetric history on file. with No LMP recorded. Patient has had an ablation. admitted for a laparoscopic bilateral salpingo oophorectomy.  Please see notes and sono report above for details Right lower quadr5ant pain worsening, we tried to suppress but pt came back in with increasing pain and wanting definitive surgical management Since she is 40 she wants both ovaries and tubes removed which is reasonable givwen her age  PMH:    Past Medical History  Diagnosis Date  . Diabetes mellitus   . Back pain   . Thyroid disease   .  Rosacea   . Ventral hernia   . Small bowel obstruction   . Complication of anesthesia   . PONV (postoperative nausea and vomiting)     PSH:     Past Surgical History  Procedure Laterality Date  . Cholecystectomy    . Endometrial ablation    . Back surgery    . Colon surgery    . Colostomy    . Revision colostomy    . Ventral hernia repair  08/03/2012    Procedure: HERNIA REPAIR VENTRAL ADULT;  Surgeon: Gwenyth Ober, MD;  Location: [facility];  Service: General;  Laterality: N/A;  . Laparotomy  08/03/2012    Procedure: EXPLORATORY LAPAROTOMY;  Surgeon: Gwenyth Ober, MD;   Location: [facility];  Service: General;  Laterality: N/A;  with extensive enterolysis  . Insertion of mesh  08/03/2012    Procedure: INSERTION OF MESH;  Surgeon: Gwenyth Ober, MD;  Location: [facility];  Service: General;  Laterality: N/A;  . Carpatunnel surgery    . Liver biopsy      POb/GynH:      OB History    No data available      SH:   Social History  Substance Use Topics  . Smoking status: Never Smoker   . Smokeless tobacco: Never Used  . Alcohol Use: No    FH:    Family History  Problem Relation Age of Onset  . Thyroid disease Mother   . Heart disease Father   . Diabetes Mellitus I Maternal Grandmother   . Diabetes Mellitus I Maternal Grandfather   . Diabetes Mellitus I Paternal Grandmother   . Diabetes Mellitus I Paternal Grandfather      Allergies:  Allergies  Allergen Reactions  . Chocolate Other (See Comments)    rosacea   . Sulfa Antibiotics Nausea Only    Medications:       Current facility-administered medications:  .  bupivacaine liposome (EXPAREL) 1.3 % injection 266 mg, 20 mL, Infiltration, Once, Florian Buff, MD .  cefoTEtan in Dextrose 5% (CEFOTAN) IVPB 2 g, 2 g, Intravenous, On Call to OR, Florian Buff, MD .  lactated ringers infusion, , Intravenous, Continuous, Lerry Liner, MD, Last Rate: 75 mL/hr at 05/12/15 0842 .  midazolam (VERSED) injection 1-2 mg, 1-2 mg, Intravenous, Q5 min PRN, Lerry Liner, MD, 2 mg at 05/12/15 0842 .  scopolamine (TRANSDERM-SCOP) 1 MG/3DAYS 1.5 mg, 1 patch, Transdermal, Once, Lerry Liner, MD, 1.5 mg at 05/12/15 9629  Review of Systems:   Review of Systems  Constitutional: Negative for fever, chills, weight loss, malaise/fatigue and diaphoresis.  HENT: Negative for hearing loss, ear pain, nosebleeds, congestion, sore throat, neck pain, tinnitus and ear discharge.   Eyes: Negative for blurred vision, double vision, photophobia, pain, discharge and redness.  Respiratory: Negative for cough, hemoptysis, sputum  production, shortness of breath, wheezing and stridor.   Cardiovascular: Negative for chest pain, palpitations, orthopnea, claudication, leg swelling and PND.  Gastrointestinal: Positive for abdominal pain. Negative for heartburn, nausea, vomiting, diarrhea, constipation, blood in stool and melena.  Genitourinary: Negative for dysuria, urgency, frequency, hematuria and flank pain.  Musculoskeletal: Negative for myalgias, back pain, joint pain and falls.  Skin: Negative for itching and rash.  Neurological: Negative for dizziness, tingling, tremors, sensory change, speech change, focal weakness, seizures, loss of consciousness, weakness and headaches.  Endo/Heme/Allergies: Negative for environmental allergies and polydipsia. Does not bruise/bleed easily.  Psychiatric/Behavioral: Negative for depression, suicidal ideas, hallucinations, memory loss and substance  abuse. The patient is not nervous/anxious and does not have insomnia.      PHYSICAL EXAM:  Blood pressure 117/72, pulse 78, temperature 98.6 F (37 C), temperature source Oral, resp. rate 12, height 5\' 7"  (1.702 m), weight 224 lb (101.606 kg), SpO2 97 %.    Vitals reviewed. Constitutional: She is oriented to person, place, and time. She appears well-developed and well-nourished.  HENT:  Head: Normocephalic and atraumatic.  Right Ear: External ear normal.  Left Ear: External ear normal.  Nose: Nose normal.  Mouth/Throat: Oropharynx is clear and moist.  Eyes: Conjunctivae and EOM are normal. Pupils are equal, round, and reactive to light. Right eye exhibits no discharge. Left eye exhibits no discharge. No scleral icterus.  Neck: Normal range of motion. Neck supple. No tracheal deviation present. No thyromegaly present.  Cardiovascular: Normal rate, regular rhythm, normal heart sounds and intact distal pulses.  Exam reveals no gallop and no friction rub.   No murmur heard. Respiratory: Effort normal and breath sounds normal. No  respiratory distress. She has no wheezes. She has no rales. She exhibits no tenderness.  GI: Soft. Bowel sounds are normal. She exhibits no distension and no mass. There is tenderness. There is no rebound and no guarding.  Genitourinary:       Vulva is normal without lesions Vagina is pink moist without discharge Cervix normal in appearance and pap is normal Uterus is normal size, contour, position, consistency, mobility, non-tender Adnexa is significant for bilateral cysts and a right hydrosalpinx Musculoskeletal: Normal range of motion. She exhibits no edema and no tenderness.  Neurological: She is alert and oriented to person, place, and time. She has normal reflexes. She displays normal reflexes. No cranial nerve deficit. She exhibits normal muscle tone. Coordination normal.  Skin: Skin is warm and dry. No rash noted. No erythema. No pallor.  Psychiatric: She has a normal mood and affect. Her behavior is normal. Judgment and thought content normal.    Labs: Results for orders placed or performed during the hospital encounter of 05/12/15 (from the past 336 hour(s))  Glucose, capillary   Collection Time: 05/12/15  8:03 AM  Result Value Ref Range   Glucose-Capillary 146 (H) 65 - 99 mg/dL  Results for orders placed or performed during the hospital encounter of 05/10/15 (from the past 336 hour(s))  Type and screen   Collection Time: 05/10/15  1:20 PM  Result Value Ref Range   ABO/RH(D) A POS    Antibody Screen NEG    Sample Expiration 05/24/2015   CBC   Collection Time: 05/10/15  1:45 PM  Result Value Ref Range   WBC 10.2 4.0 - 10.5 K/uL   RBC 4.80 3.87 - 5.11 MIL/uL   Hemoglobin 14.9 12.0 - 15.0 g/dL   HCT 43.9 36.0 - 46.0 %   MCV 91.5 78.0 - 100.0 fL   MCH 31.0 26.0 - 34.0 pg   MCHC 33.9 30.0 - 36.0 g/dL   RDW 13.4 11.5 - 15.5 %   Platelets 263 150 - 400 K/uL  Comprehensive metabolic panel   Collection Time: 05/10/15  1:45 PM  Result Value Ref Range   Sodium 135 135 - 145  mmol/L   Potassium 3.8 3.5 - 5.1 mmol/L   Chloride 105 101 - 111 mmol/L   CO2 21 (L) 22 - 32 mmol/L   Glucose, Bld 149 (H) 65 - 99 mg/dL   BUN 11 6 - 20 mg/dL   Creatinine, Ser 0.52 0.44 - 1.00 mg/dL  Calcium 8.8 (L) 8.9 - 10.3 mg/dL   Total Protein 6.9 6.5 - 8.1 g/dL   Albumin 4.1 3.5 - 5.0 g/dL   AST 25 15 - 41 U/L   ALT 25 14 - 54 U/L   Alkaline Phosphatase 56 38 - 126 U/L   Total Bilirubin 0.9 0.3 - 1.2 mg/dL   GFR calc non Af Amer >60 >60 mL/min   GFR calc Af Amer >60 >60 mL/min   Anion gap 9 5 - 15    EKG: Orders placed or performed during the hospital encounter of 05/12/15  . EKG 12-Lead  . EKG 12-Lead    Imaging Studies: Mr Lumbar Spine W Wo Contrast  04/14/2015   CLINICAL DATA:  Low back pain radiating to the right hip with lytic and foot numbness, tingling, and weakness for 2 months.  EXAM: MRI LUMBAR SPINE WITHOUT AND WITH CONTRAST  TECHNIQUE: Multiplanar and multiecho pulse sequences of the lumbar spine were obtained without and with intravenous contrast.  CONTRAST:  92mL MULTIHANCE GADOBENATE DIMEGLUMINE 529 MG/ML IV SOLN  COMPARISON:  10/28/2010  FINDINGS: L4-5 discectomy with posterior rod and pedicle screw fixation. Bony fusion is complete.  No marrow signal abnormality suggestive of fracture, infection, or neoplasm. Normal conus signal and morphology.  There are bilateral adnexal cysts, lobulated and at least 5 cm on the left, which is increased by 2 cm from abdominal CT 03/31/2013.  No abnormal intrathecal enhancement.  No evidence of arachnoiditis.  Degenerative changes:  T11-12: Disc narrowing and bulging which is stable. No significant stenosis.  T12- L1: Central disc protrusion with mild inferior migration. Negative facets. No stenosis or impingement.  L1-L2: Mild facet overgrowth, better seen on the right. No herniation or stenosis.  L2-L3: No herniation or stenosis.  L3-L4: Facet and ligamentous overgrowth with mild disc bulging. Stable bilateral subarticular  recess stenosis with crowding of the right L4 nerve root but no definite compression or change. Open foramen.  L4-L5: Discectomy with complete bony fusion. No evidence of residual canal or foraminal stenosis.  L5-S1:Bulky facet arthropathy which is minimally progressed given the long interval. Mild disc bulging, greater to the left foraminal region with mild foraminal stenosis, stable. No discrete impingement  IMPRESSION: 1. Disc and facet degeneration with no significant progression since 2012. 2. L4-5 discectomy with no residual stenosis. 3. L3-4 stable mild bilateral subarticular recess narrowing. 4. 5 cm left adnexal cyst which is new or enlarged since 2014 abdominal CT. Recommend gynecologic follow-up for pelvic ultrasound in 6-8 weeks.   Electronically Signed   By: Monte Fantasia M.D.   On: 04/14/2015 14:02   US Transvaginal Non-ob  04/19/2015   GYNECOLOGIC SONOGRAM   Nancy [name] is a 49 y.o. s/p ablasion for a pelvic sonogram for pelvic  pain,rt ov cyst seen on MRI.  Uterus                      9.3 x 4.8 x 4.44 cm,  heterogenous anteverted  uterus w/a post. intramural fibroid 2.1 x 1.9 x 2.8cm  Endometrium          4.8 mm, symmetrical, wnl  Right ovary             5.8 x 5.03 x 5.2 cm, ,rt ov: 4.3 x 4.3 x 4.2 cm  hemorrhagic cyst w/ a thick septation,rt hydrosalpinx  Left ovary                5.3 x 4.5 x 3.8  cm, ,simple lt ov cyst 4.3 x 3.6  x 3.6cm  US PELVIS TA/TV: heterogenous anteverted uterus w/a post. intramural  fibroid 2.1 x 1.9 x 2.8cm,rt ov: 4.3 x 4.3 x 4.2 cm hemorrhagic cyst w/ a  thick septation,rt hydrosalpinx,simple lt ov cyst 4.3 x 3.6 x 3.6cm,both  ov's appears to be mobile w/venous and art flow seen,bilat adnexal pain  during ultrasound,EEC 4.60mm   Technician Comments:  US PELVIS TA/TV: heterogenous anteverted uterus w/a post. intramural  fibroid 2.1 x 1.9 x 2.8cm,rt ov: 4.3 x 4.3 x 4.2 cm hemorrhagic cyst w/ a  thick septation,rt hydrosalpinx,simple lt ov cyst 4.3 x 3.6 x 3.6cm,both   ov's appears to be mobile w/venous and art flow seen,bilat adnexal pain  during ultrasound,EEC 4.5mm    Amber J Carl 04/19/2015 11:20 AM  Clinical Impression and recommendations:  I have reviewed the sonogram results above, combined with the patient's  current clinical course, below are my impressions and any appropriate  recommendations for management based on the sonographic findings.  Hemorrhagic cyst right ovary with post tubal hydrosalpinx Simple cyst left ovary Uterus clinically insignificant myoma   EURE,LUTHER H 04/19/2015 11:30 AM     US Pelvis Complete  04/19/2015   GYNECOLOGIC SONOGRAM   Nancy [name] is a 49 y.o. s/p ablasion for a pelvic sonogram for pelvic  pain,rt ov cyst seen on MRI.  Uterus                      9.3 x 4.8 x 4.44 cm,  heterogenous anteverted  uterus w/a post. intramural fibroid 2.1 x 1.9 x 2.8cm  Endometrium          4.8 mm, symmetrical, wnl  Right ovary             5.8 x 5.03 x 5.2 cm, ,rt ov: 4.3 x 4.3 x 4.2 cm  hemorrhagic cyst w/ a thick septation,rt hydrosalpinx  Left ovary                5.3 x 4.5 x 3.8 cm, ,simple lt ov cyst 4.3 x 3.6  x 3.6cm  US PELVIS TA/TV: heterogenous anteverted uterus w/a post. intramural  fibroid 2.1 x 1.9 x 2.8cm,rt ov: 4.3 x 4.3 x 4.2 cm hemorrhagic cyst w/ a  thick septation,rt hydrosalpinx,simple lt ov cyst 4.3 x 3.6 x 3.6cm,both  ov's appears to be mobile w/venous and art flow seen,bilat adnexal pain  during ultrasound,EEC 4.57mm   Technician Comments:  US PELVIS TA/TV: heterogenous anteverted uterus w/a post. intramural  fibroid 2.1 x 1.9 x 2.8cm,rt ov: 4.3 x 4.3 x 4.2 cm hemorrhagic cyst w/ a  thick septation,rt hydrosalpinx,simple lt ov cyst 4.3 x 3.6 x 3.6cm,both  ov's appears to be mobile w/venous and art flow seen,bilat adnexal pain  during ultrasound,EEC 4.38mm    Amber J Carl 04/19/2015 11:20 AM  Clinical Impression and recommendations:  I have reviewed the sonogram results above, combined with the patient's  current clinical course, below  are my impressions and any appropriate  recommendations for management based on the sonographic findings.  Hemorrhagic cyst right ovary with post tubal hydrosalpinx Simple cyst left ovary Uterus clinically insignificant myoma   EURE,LUTHER H 04/19/2015 11:30 AM        Assessment: Right ovarian cyst, complex, with a right hydrosalpinx Left ovarian cyst, simple Right lower quadrant pain   Patient Active Problem List   Diagnosis Date Noted  . Infected zoster 04/29/2013  . Hematoma 03/27/2013  .  Acute postoperative pain of abdomen 12/27/2012  . Postop check 10/01/2012  . Wound seroma 08/30/2012  . Seroma 08/22/2012  . Chronic back pain 08/10/2012  . Hypothyroidism 08/10/2012  . Ventral hernia with obstruction 08/10/2012  . Rosacea 08/10/2012  . Hypokalemia 08/10/2012  . DM 02/15/2009    Plan: Laparoscopic bilateral salpingo oophorectomy  EURE,LUTHER H 05/12/2015 8:54 AM

## 2015-05-12 NOTE — Anesthesia Preprocedure Evaluation (Signed)
Anesthesia Evaluation  Patient identified by MRN, date of birth, ID band Patient awake    Reviewed: Allergy & Precautions, NPO status , Patient's Chart, lab work & pertinent test results  History of Anesthesia Complications (+) PONV and history of anesthetic complications  Airway Mallampati: II  TM Distance: >3 FB     Dental  (+) Teeth Intact, Dental Advisory Given   Pulmonary neg pulmonary ROS,    breath sounds clear to auscultation       Cardiovascular negative cardio ROS   Rhythm:Regular Rate:Normal     Neuro/Psych    GI/Hepatic negative GI ROS,   Endo/Other  diabetes, Well Controlled, Type 2, Insulin DependentHypothyroidism   Renal/GU      Musculoskeletal   Abdominal   Peds  Hematology   Anesthesia Other Findings   Reproductive/Obstetrics                             Anesthesia Physical Anesthesia Plan  ASA: II  Anesthesia Plan: General   Post-op Pain Management:    Induction: Intravenous  Airway Management Planned: Oral ETT  Additional Equipment:   Intra-op Plan:   Post-operative Plan: Extubation in OR  Informed Consent: I have reviewed the patients History and Physical, chart, labs and discussed the procedure including the risks, benefits and alternatives for the proposed anesthesia with the patient or authorized representative who has indicated his/her understanding and acceptance.     Plan Discussed with:   Anesthesia Plan Comments:         Anesthesia Quick Evaluation

## 2015-05-12 NOTE — Discharge Instructions (Signed)
NOTE You did not have torsion, this just gives the best post op instructions   Laparoscopic Ovarian Torsion Surgery The ovaries are female reproductive organs that produce eggs. Ovarian torsion is when an ovary becomes twisted and cuts off its own blood supply. If the ovary becomes twisted, it cannot get blood and the ovary swells. This swelling can cause extreme pelvic pain that can come and go. Laparoscopic ovarian torsion surgery is needed to untwist the ovary and restore blood flow to the ovary.  LET YOUR CAREGIVER KNOW ABOUT:   Allergies to food or medicine.  Medicines taken, including vitamins, herbs, eyedrops, over-the-counter medicines, and creams.  Use of steroids (by mouth or creams).  Previous problems with anesthetics or numbing medicines.  History of bleeding problems or blood clots.  Previous surgery.  Other health problems, including hypertension, diabetes, and kidney problems.  Possibility of pregnancy. RISKS AND COMPLICATIONS  Allergies to medicines.  Difficulty breathing.  Bleeding.  Infection.  Damage to other structures near the ovaries. BEFORE THE PROCEDURE  If possible, ask your caregiver about changing or stopping your regular medicines.  If possible, do not eat or drink anything for at least 8 hours before your surgery.  If possible, stop smoking (if you smoke). Stopping will improve your health after surgery.  Arrange for a ride home after surgery and for help at home during recovery. PROCEDURE  An intravenous (IV) access tube will be put into one of your vein in order to give you fluids and medicines.  You will receive medicines to relax you and medicines that make you sleep (general anesthetic).  You may have a flexible tube (catheter) put into your bladder to drain urine.  You may have a tube put through your nose or mouth into your stomach (nasogastric tube). The nasogastric tube removes digestive fluids and prevents you from feeling  sick to your stomach (nauseous) and throwing up (vomiting).  Three to four small incisions (laparoscopically) or an open incision will be made in the abdomen. Your surgeon will decide the appropriate approach.  The ovary will be untwisted with small instruments inserted through the laparoscopic incisions.  The ovary is then observed to see if blood flow returns. If blood flow cannot be restored to the ovary, it may have to be surgically removed. AFTER THE PROCEDURE   Plan to be in the hospital for 1 day or less.  You may have abdominal cramping and a sore throat. Your pain will be controlled with medicine.  You may have a liquid diet temporarily. You will most likely return to, and tolerate, your usual diet the day after surgery.  You will be passing urine through a catheter. The catheter will be removed after surgery.  Your temperature, breathing rate, heart rate, blood pressure, and oxygen level will be monitored regularly.  You will still wear compression stockings on your legs until you are able to move around.  You will use a device or do breathing exercises to keep your lungs clear.  You will be encouraged to walk as soon as possible.  Expect a full recovery in 4 to 6 weeks after surgery. Document Released: 10/30/2011 Document Reviewed: 10/30/2011 [facility] Patient Information 2015 [place]. This information is not intended to replace advice given to you by your health care provider. Make sure you discuss any questions you have with your health care provider. Bilateral Salpingo-Oophorectomy, Care After Refer to this sheet in the next few weeks. These instructions provide you with information on caring for  yourself after your procedure. Your health care provider may also give you more specific instructions. Your treatment has been planned according to current medical practices, but problems sometimes occur. Call your health care provider if you have any problems or questions  after your procedure. WHAT TO EXPECT AFTER THE PROCEDURE After your procedure, it is typical to have the following:  Abdominal pain that can be controlled with medicine. Vaginal spotting. Constipation. Menopausal symptoms such as hot flashes, vaginal dryness, and mood swings. HOME CARE INSTRUCTIONS  Get plenty of rest and sleep. Only take over-the-counter or prescription medicines as directed by your health care provider. Do not take aspirin. It can cause bleeding. Keep incision areas clean and dry. Remove or change bandages (dressings) only as directed by your health care provider. Take showers instead of baths for a few weeks as directed by your health care provider. Limit exercise and activities as directed by your health care provider. Do not lift anything heavier than 5 pounds (2.3 kg) until your health care provider approves. Do not drive until your health care provider approves. Follow your health care provider's advice regarding diet. You may be able to resume your usual diet right away. Drink enough fluids to keep your urine clear or pale yellow. Do not douche, use tampons, or have sexual intercourse for 6 weeks after the procedure. Do not drink alcohol until your health care provider says it is okay. Take your temperature twice a day and write it down. If you become constipated, you may: Ask your health care provider about taking a mild laxative. Add more fruit and bran to your diet. Drink more fluids. Follow up with your health care provider as directed. SEEK MEDICAL CARE IF:  You have swelling, redness, or increasing pain in the incision area. You see pus coming from the incision area. You notice a bad smell coming from the wound or dressing. You have pain, redness, or swelling where the IV access tube was placed. Your incision is breaking open (the edges are not staying together). You feel dizzy or feel like fainting. You develop pain or bleeding when you urinate. You  develop diarrhea. You develop nausea and vomiting. You develop abnormal vaginal discharge. You develop a rash. You have pain that is not controlled with medicine. SEEK IMMEDIATE MEDICAL CARE IF:  You develop a fever. You develop abdominal pain. You have chest pain. You develop shortness of breath. You pass out. You develop pain, swelling, or redness in your leg. You develop heavy vaginal bleeding with or without blood clots. Document Released: 08/07/2005 Document Revised: 04/09/2013 Document Reviewed: 01/29/2013 [facility] Patient Information 2015 [place], [place]. This information is not intended to replace advice given to you by your health care provider. Make sure you discuss any questions you have with your health care provider.

## 2015-05-12 NOTE — Anesthesia Procedure Notes (Signed)
Procedure Name: Intubation Date/Time: 05/12/2015 9:24 AM Performed by: Vista Deck Pre-anesthesia Checklist: Patient identified, Patient being monitored, Timeout performed, Emergency Drugs available and Suction available Patient Re-evaluated:Patient Re-evaluated prior to inductionOxygen Delivery Method: Circle System Utilized Preoxygenation: Pre-oxygenation with 100% oxygen Intubation Type: IV induction Ventilation: Mask ventilation without difficulty Laryngoscope Size: Miller and 2 Grade View: Grade I Tube type: Oral Tube size: 7.0 mm Number of attempts: 1 Airway Equipment and Method: Stylet and Oral airway Placement Confirmation: ETT inserted through vocal cords under direct vision,  positive ETCO2 and breath sounds checked- equal and bilateral Secured at: 21 cm Tube secured with: Tape Dental Injury: Teeth and Oropharynx as per pre-operative assessment

## 2015-05-12 NOTE — Transfer of Care (Signed)
Immediate Anesthesia Transfer of Care Note  Patient: Nancy [name]  Procedure(s) Performed: Procedure(s): LAPAROSCOPIC BILATERAL SALPINGO OOPHORECTOMY (Bilateral) EXTENSIVE LAPAROSCOPIC LYSIS OF ADHESIONS (Bilateral)  Patient Location: PACU  Anesthesia Type:General  Level of Consciousness: awake and patient cooperative  Airway & Oxygen Therapy: Patient Spontanous Breathing and non-rebreather face mask  Post-op Assessment: Report given to RN, Post -op Vital signs reviewed and stable and Patient moving all extremities  Post vital signs: Reviewed and stable    Complications: No apparent anesthesia complications

## 2015-05-12 NOTE — Anesthesia Postprocedure Evaluation (Signed)
  Anesthesia Post-op Note  Patient: Nancy [name]  Procedure(s) Performed: Procedure(s): LAPAROSCOPIC BILATERAL SALPINGO OOPHORECTOMY (Bilateral) EXTENSIVE LAPAROSCOPIC LYSIS OF ADHESIONS (Bilateral)  Patient Location: PACU  Anesthesia Type:General  Level of Consciousness: awake and patient cooperative  Airway and Oxygen Therapy: Patient Spontanous Breathing  Post-op Pain: mild  Post-op Assessment: Post-op Vital signs reviewed, Patient's Cardiovascular Status Stable, Respiratory Function Stable and Patent Airway              Post-op Vital Signs: Reviewed and stable  Last Vitals:  Filed Vitals:   05/12/15 1204  BP:   Pulse:   Temp: 36.6 C  Resp:     Complications: No apparent anesthesia complications

## 2015-05-13 ENCOUNTER — Encounter (HOSPITAL_COMMUNITY): Payer: Self-pay | Admitting: Obstetrics & Gynecology

## 2015-05-20 ENCOUNTER — Ambulatory Visit (INDEPENDENT_AMBULATORY_CARE_PROVIDER_SITE_OTHER): Payer: Medicare Other | Admitting: Obstetrics & Gynecology

## 2015-05-20 ENCOUNTER — Encounter: Payer: Self-pay | Admitting: Obstetrics & Gynecology

## 2015-05-20 VITALS — BP 120/80 | HR 76 | Wt 219.0 lb

## 2015-05-20 DIAGNOSIS — Z9889 Other specified postprocedural states: Secondary | ICD-10-CM

## 2015-05-20 MED ORDER — OXYCODONE-ACETAMINOPHEN 7.5-325 MG PO TABS: 1.0000 | ORAL_TABLET | Freq: Four times a day (QID) | ORAL | Status: DC | PRN

## 2015-05-20 NOTE — Progress Notes (Signed)
Patient ID: Nancy [name], female   DOB: 1966-06-18, 49 y.o.   MRN: [id number] Patient Information    Patient Name Sex DOB SSN   Nancy, [name] Female 1966/07/26 [id number]    [name]&P by Florian Buff, MD at 05/12/2015 8:53 AM    Author: Florian Buff, MD Service: Obstetrics/Gynecology Author Type: Physician   Filed: 05/12/2015 8:58 AM Note Time: 05/12/2015 8:53 AM Status: Signed   Editor: Florian Buff, MD (Physician)     Expand All Collapse All      Patient presents with  . Follow-up    had MRI.    Blood pressure 110/80, pulse 76, weight 225 lb (102.059 kg).  49 y.o. No obstetric history on file. No LMP recorded. Patient has had an ablation. The current method of family planning is tubal ligation.  Subjective Pt has been having RLQ pain for about 2 months Sharp, worsening Worse with movement twisting lifting History of back surgery and had MRI which revealed normal post op back and a 5 cm right ovarian complex  Objective Abdomen soft tender RLQ moderate no rebound  Pertinent ROS No burning with urination, frequency or urgency No nausea, vomiting or diarrhea Nor fever chills or other constitutional symptoms   Labs or studies  Imaging Results (Last 48 hours)    US Transvaginal Non-ob  04/19/2015 GYNECOLOGIC SONOGRAM Nancy [name] is a 49 y.o. s/p ablasion for a pelvic sonogram for pelvic pain,rt ov cyst seen on MRI. Uterus 9.3 x 4.8 x 4.44 cm, heterogenous anteverted uterus w/a post. intramural fibroid 2.1 x 1.9 x 2.8cm Endometrium 4.8 mm, symmetrical, wnl Right ovary 5.8 x 5.03 x 5.2 cm, ,rt ov: 4.3 x 4.3 x 4.2 cm hemorrhagic cyst w/ a thick septation,rt hydrosalpinx Left ovary 5.3 x 4.5 x 3.8 cm, ,simple lt ov cyst 4.3 x 3.6 x 3.6cm US PELVIS TA/TV: heterogenous anteverted uterus w/a post. intramural fibroid 2.1 x 1.9 x 2.8cm,rt ov: 4.3 x 4.3 x 4.2 cm hemorrhagic cyst w/ a thick  septation,rt hydrosalpinx,simple lt ov cyst 4.3 x 3.6 x 3.6cm,both ov's appears to be mobile w/venous and art flow seen,bilat adnexal pain during ultrasound,EEC 4.42mm Technician Comments: US PELVIS TA/TV: heterogenous anteverted uterus w/a post. intramural fibroid 2.1 x 1.9 x 2.8cm,rt ov: 4.3 x 4.3 x 4.2 cm hemorrhagic cyst w/ a thick septation,rt hydrosalpinx,simple lt ov cyst 4.3 x 3.6 x 3.6cm,both ov's appears to be mobile w/venous and art flow seen,bilat adnexal pain during ultrasound,EEC 4.66mm Amber J Carl 04/19/2015 11:20 AM Clinical Impression and recommendations: I have reviewed the sonogram results above, combined with the patient's current clinical course, below are my impressions and any appropriate recommendations for management based on the sonographic findings. Hemorrhagic cyst right ovary with post tubal hydrosalpinx Simple cyst left ovary Uterus clinically insignificant myoma Nancy [name],Nancy [name] 04/19/2015 11:30 AM   US Pelvis Complete  04/19/2015 GYNECOLOGIC SONOGRAM Nancy [name] is a 49 y.o. s/p ablasion for a pelvic sonogram for pelvic pain,rt ov cyst seen on MRI. Uterus 9.3 x 4.8 x 4.44 cm, heterogenous anteverted uterus w/a post. intramural fibroid 2.1 x 1.9 x 2.8cm Endometrium 4.8 mm, symmetrical, wnl Right ovary 5.8 x 5.03 x 5.2 cm, ,rt ov: 4.3 x 4.3 x 4.2 cm hemorrhagic cyst w/ a thick septation,rt hydrosalpinx Left ovary 5.3 x 4.5 x 3.8 cm, ,simple lt ov cyst 4.3 x 3.6 x 3.6cm US PELVIS TA/TV: heterogenous anteverted uterus w/a post. intramural fibroid 2.1 x 1.9 x 2.8cm,rt ov: 4.3 x 4.3 x  4.2 cm hemorrhagic cyst w/ a thick septation,rt hydrosalpinx,simple lt ov cyst 4.3 x 3.6 x 3.6cm,both ov's appears to be mobile w/venous and art flow seen,bilat adnexal pain during ultrasound,EEC 4.53mm Technician Comments: US PELVIS TA/TV: heterogenous anteverted uterus w/a post. intramural fibroid 2.1 x 1.9 x  2.8cm,rt ov: 4.3 x 4.3 x 4.2 cm hemorrhagic cyst w/ a thick septation,rt hydrosalpinx,simple lt ov cyst 4.3 x 3.6 x 3.6cm,both ov's appears to be mobile w/venous and art flow seen,bilat adnexal pain during ultrasound,EEC 4.31mm Amber J Carl 04/19/2015 11:20 AM Clinical Impression and recommendations: I have reviewed the sonogram results above, combined with the patient's current clinical course, below are my impressions and any appropriate recommendations for management based on the sonographic findings. Hemorrhagic cyst right ovary with post tubal hydrosalpinx Simple cyst left ovary Uterus clinically insignificant myoma Nancy [name],Nancy [name] 04/19/2015 11:30 AM       Impression Diagnoses this Encounter::   ICD-9-CM ICD-10-CM   1. Bilateral ovarian cysts 620.2 N83.20   2. Ovarian cancer, unspecified laterality 183.0 C56.9 CA 125     US Pelvis Complete     US Transvaginal Non-OB  3. RLQ abdominal pain 789.03 R10.31     Established relevant diagnosis(es):   Plan/Recommendations Meds ordered this encounter  Medications  . megestrol (MEGACE) 40 MG tablet    Sig: Take 1 tablet (40 mg total) by mouth daily.    Dispense: 30 tablet    Refill: 2    Orders Placed This Encounter  Procedures  . US Pelvis Complete  . US Transvaginal Non-OB  . CA 125    Will start megestrol if CA 125 normal See if we can suppress that side if not will need laparoscopic removal of both ovaries given her 49 years old         GYNECOLOGIC SONOGRAM   Nancy [name] is a 49 y.o. s/p ablasion for a pelvic sonogram for pelvic pain,rt ov cyst seen on MRI.  Uterus 9.3 x 4.8 x 4.44 cm, heterogenous anteverted uterus w/a post. intramural fibroid 2.1 x 1.9 x 2.8cm  Endometrium 4.8 mm, symmetrical, wnl  Right ovary 5.8 x 5.03 x 5.2 cm, ,rt ov: 4.3 x 4.3 x 4.2 cm  hemorrhagic cyst w/ a thick septation,rt hydrosalpinx  Left ovary 5.3 x 4.5 x 3.8 cm, ,simple lt ov cyst 4.3 x 3.6 x 3.6cm  US PELVIS TA/TV: heterogenous anteverted uterus w/a post. intramural fibroid 2.1 x 1.9 x 2.8cm,rt ov: 4.3 x 4.3 x 4.2 cm hemorrhagic cyst w/ a thick septation,rt hydrosalpinx,simple lt ov cyst 4.3 x 3.6 x 3.6cm,both ov's appears to be mobile w/venous and art flow seen,bilat adnexal pain during ultrasound,EEC 4.78mm   Technician Comments:  US PELVIS TA/TV: heterogenous anteverted uterus w/a post. intramural fibroid 2.1 x 1.9 x 2.8cm,rt ov: 4.3 x 4.3 x 4.2 cm hemorrhagic cyst w/ a thick septation,rt hydrosalpinx,simple lt ov cyst 4.3 x 3.6 x 3.6cm,both ov's appears to be mobile w/venous and art flow seen,bilat adnexal pain during ultrasound,EEC 4.12mm    Amber J Carl 04/19/2015 11:20 AM  Clinical Impression and recommendations:  I have reviewed the sonogram results above, combined with the patient's current clinical course, below are my impressions and any appropriate recommendations for management based on the sonographic findings.  Hemorrhagic cyst right ovary with post tubal hydrosalpinx Simple cyst left ovary Uterus clinically insignificant myoma   Nancy [name],Nancy [name] 04/19/2015 11:30 AM   Preoperative History and Physical  Nancy [name] is a 49 y.o. No obstetric history on file. with  No LMP recorded. Patient has had an ablation. admitted for a laparoscopic bilateral salpingo oophorectomy.  Please see notes and sono report above for details Right lower quadr5ant pain worsening, we tried to suppress but pt came back in with increasing pain and wanting definitive surgical management Since she is 34 she wants both ovaries and tubes removed which is reasonable givwen her age  PMH:  Past Medical History  Diagnosis Date  . Diabetes mellitus   . Back pain   . Thyroid disease   . Rosacea   . Ventral hernia   . Small bowel  obstruction   . Complication of anesthesia   . PONV (postoperative nausea and vomiting)     PSH:  Past Surgical History  Procedure Laterality Date  . Cholecystectomy    . Endometrial ablation    . Back surgery    . Colon surgery    . Colostomy    . Revision colostomy    . Ventral hernia repair  08/03/2012    Procedure: HERNIA REPAIR VENTRAL ADULT; Surgeon: Gwenyth Ober, MD; Location: [facility]; Service: General; Laterality: N/A;  . Laparotomy  08/03/2012    Procedure: EXPLORATORY LAPAROTOMY; Surgeon: Gwenyth Ober, MD; Location: [facility]; Service: General; Laterality: N/A; with extensive enterolysis  . Insertion of mesh  08/03/2012    Procedure: INSERTION OF MESH; Surgeon: Gwenyth Ober, MD; Location: [facility]; Service: General; Laterality: N/A;  . Carpatunnel surgery    . Liver biopsy      POb/GynH:  OB History    No data available      SH:  Social History  Substance Use Topics  . Smoking status: Never Smoker   . Smokeless tobacco: Never Used  . Alcohol Use: No    FH:  Family History  Problem Relation Age of Onset  . Thyroid disease Mother   . Heart disease Father   . Diabetes Mellitus I Maternal Grandmother   . Diabetes Mellitus I Maternal Grandfather   . Diabetes Mellitus I Paternal Grandmother   . Diabetes Mellitus I Paternal Grandfather      Allergies:  Allergies  Allergen Reactions  . Chocolate Other (See Comments)    rosacea   . Sulfa Antibiotics Nausea Only    Medications:  Current facility-administered medications:  . bupivacaine liposome (EXPAREL) 1.3 % injection 266 mg, 20 mL, Infiltration, Once, Florian Buff, MD . cefoTEtan in Dextrose 5% (CEFOTAN) IVPB 2 g, 2 g, Intravenous, On Call to OR, Florian Buff, MD . lactated ringers infusion, , Intravenous, Continuous, Lerry Liner, MD, Last Rate: 75  mL/hr at 05/12/15 0842 . midazolam (VERSED) injection 1-2 mg, 1-2 mg, Intravenous, Q5 min PRN, Lerry Liner, MD, 2 mg at 05/12/15 0842 . scopolamine (TRANSDERM-SCOP) 1 MG/3DAYS 1.5 mg, 1 patch, Transdermal, Once, Lerry Liner, MD, 1.5 mg at 05/12/15 6503  Review of Systems:   Review of Systems  Constitutional: Negative for fever, chills, weight loss, malaise/fatigue and diaphoresis.  HENT: Negative for hearing loss, ear pain, nosebleeds, congestion, sore throat, neck pain, tinnitus and ear discharge.  Eyes: Negative for blurred vision, double vision, photophobia, pain, discharge and redness.  Respiratory: Negative for cough, hemoptysis, sputum production, shortness of breath, wheezing and stridor.  Cardiovascular: Negative for chest pain, palpitations, orthopnea, claudication, leg swelling and PND.  Gastrointestinal: Positive for abdominal pain. Negative for heartburn, nausea, vomiting, diarrhea, constipation, blood in stool and melena.  Genitourinary: Negative for dysuria, urgency, frequency, hematuria and flank pain.  Musculoskeletal: Negative for myalgias, back pain,  joint pain and falls.  Skin: Negative for itching and rash.  Neurological: Negative for dizziness, tingling, tremors, sensory change, speech change, focal weakness, seizures, loss of consciousness, weakness and headaches.  Endo/Heme/Allergies: Negative for environmental allergies and polydipsia. Does not bruise/bleed easily.  Psychiatric/Behavioral: Negative for depression, suicidal ideas, hallucinations, memory loss and substance abuse. The patient is not nervous/anxious and does not have insomnia.     PHYSICAL EXAM:  Blood pressure 117/72, pulse 78, temperature 98.6 F (37 C), temperature source Oral, resp. rate 12, height 5\' 7"  (1.702 m), weight 224 lb (101.606 kg), SpO2 97 %.   Vitals reviewed. Constitutional: She is oriented to person, place, and time. She appears well-developed and well-nourished.  HENT:   Head: Normocephalic and atraumatic.  Right Ear: External ear normal.  Left Ear: External ear normal.  Nose: Nose normal.  Mouth/Throat: Oropharynx is clear and moist.  Eyes: Conjunctivae and EOM are normal. Pupils are equal, round, and reactive to light. Right eye exhibits no discharge. Left eye exhibits no discharge. No scleral icterus.  Neck: Normal range of motion. Neck supple. No tracheal deviation present. No thyromegaly present.  Cardiovascular: Normal rate, regular rhythm, normal heart sounds and intact distal pulses. Exam reveals no gallop and no friction rub.  No murmur heard. Respiratory: Effort normal and breath sounds normal. No respiratory distress. She has no wheezes. She has no rales. She exhibits no tenderness.  GI: Soft. Bowel sounds are normal. She exhibits no distension and no mass. There is tenderness. There is no rebound and no guarding.  Genitourinary:   Vulva is normal without lesions Vagina is pink moist without discharge Cervix normal in appearance and pap is normal Uterus is normal size, contour, position, consistency, mobility, non-tender Adnexa is significant for bilateral cysts and a right hydrosalpinx Musculoskeletal: Normal range of motion. She exhibits no edema and no tenderness.  Neurological: She is alert and oriented to person, place, and time. She has normal reflexes. She displays normal reflexes. No cranial nerve deficit. She exhibits normal muscle tone. Coordination normal.  Skin: Skin is warm and dry. No rash noted. No erythema. No pallor.  Psychiatric: She has a normal mood and affect. Her behavior is normal. Judgment and thought content normal.    Labs: Results for orders placed or performed during the hospital encounter of 05/12/15 (from the past 336 hour(s))  Glucose, capillary   Collection Time: 05/12/15 8:03 AM  Result Value Ref Range   Glucose-Capillary 146 ([name]) 65 - 99 mg/dL  Results for orders placed or performed  during the hospital encounter of 05/10/15 (from the past 336 hour(s))  Type and screen   Collection Time: 05/10/15 1:20 PM  Result Value Ref Range   ABO/RH(D) A POS    Antibody Screen NEG    Sample Expiration 05/24/2015   CBC   Collection Time: 05/10/15 1:45 PM  Result Value Ref Range   WBC 10.2 4.0 - 10.5 K/uL   RBC 4.80 3.87 - 5.11 MIL/uL   Hemoglobin 14.9 12.0 - 15.0 g/dL   HCT 43.9 36.0 - 46.0 %   MCV 91.5 78.0 - 100.0 fL   MCH 31.0 26.0 - 34.0 pg   MCHC 33.9 30.0 - 36.0 g/dL   RDW 13.4 11.5 - 15.5 %   Platelets 263 150 - 400 K/uL  Comprehensive metabolic panel   Collection Time: 05/10/15 1:45 PM  Result Value Ref Range   Sodium 135 135 - 145 mmol/L   Potassium 3.8 3.5 - 5.1 mmol/L  Chloride 105 101 - 111 mmol/L   CO2 21 (L) 22 - 32 mmol/L   Glucose, Bld 149 ([name]) 65 - 99 mg/dL   BUN 11 6 - 20 mg/dL   Creatinine, Ser 0.52 0.44 - 1.00 mg/dL   Calcium 8.8 (L) 8.9 - 10.3 mg/dL   Total Protein 6.9 6.5 - 8.1 g/dL   Albumin 4.1 3.5 - 5.0 g/dL   AST 25 15 - 41 U/L   ALT 25 14 - 54 U/L   Alkaline Phosphatase 56 38 - 126 U/L   Total Bilirubin 0.9 0.3 - 1.2 mg/dL   GFR calc non Af Amer >60 >60 mL/min   GFR calc Af Amer >60 >60 mL/min   Anion gap 9 5 - 15    EKG: Orders placed or performed during the hospital encounter of 05/12/15  . EKG 12-Lead  . EKG 12-Lead    Imaging Studies:  Imaging Results    Mr Lumbar Spine W Wo Contrast  04/14/2015 CLINICAL DATA: Low back pain radiating to the right hip with lytic and foot numbness, tingling, and weakness for 2 months. EXAM: MRI LUMBAR SPINE WITHOUT AND WITH CONTRAST TECHNIQUE: Multiplanar and multiecho pulse sequences of the lumbar spine were obtained without and with intravenous contrast. CONTRAST: 43mL MULTIHANCE GADOBENATE DIMEGLUMINE 529 MG/ML IV SOLN COMPARISON: 10/28/2010  FINDINGS: L4-5 discectomy with posterior rod and pedicle screw fixation. Bony fusion is complete. No marrow signal abnormality suggestive of fracture, infection, or neoplasm. Normal conus signal and morphology. There are bilateral adnexal cysts, lobulated and at least 5 cm on the left, which is increased by 2 cm from abdominal CT 03/31/2013. No abnormal intrathecal enhancement. No evidence of arachnoiditis. Degenerative changes: T11-12: Disc narrowing and bulging which is stable. No significant stenosis. T12- L1: Central disc protrusion with mild inferior migration. Negative facets. No stenosis or impingement. L1-L2: Mild facet overgrowth, better seen on the right. No herniation or stenosis. L2-L3: No herniation or stenosis. L3-L4: Facet and ligamentous overgrowth with mild disc bulging. Stable bilateral subarticular recess stenosis with crowding of the right L4 nerve root but no definite compression or change. Open foramen. L4-L5: Discectomy with complete bony fusion. No evidence of residual canal or foraminal stenosis. L5-S1:Bulky facet arthropathy which is minimally progressed given the long interval. Mild disc bulging, greater to the left foraminal region with mild foraminal stenosis, stable. No discrete impingement IMPRESSION: 1. Disc and facet degeneration with no significant progression since 2012. 2. L4-5 discectomy with no residual stenosis. 3. L3-4 stable mild bilateral subarticular recess narrowing. 4. 5 cm left adnexal cyst which is new or enlarged since 2014 abdominal CT. Recommend gynecologic follow-up for pelvic ultrasound in 6-8 weeks. Electronically Signed By: Monte Fantasia M.D. On: 04/14/2015 14:02   US Transvaginal Non-ob  04/19/2015 GYNECOLOGIC SONOGRAM Nancy [name] is a 49 y.o. s/p ablasion for a pelvic sonogram for pelvic pain,rt ov cyst seen on MRI. Uterus 9.3 x 4.8 x 4.44 cm, heterogenous anteverted uterus w/a post. intramural fibroid 2.1  x 1.9 x 2.8cm Endometrium 4.8 mm, symmetrical, wnl Right ovary 5.8 x 5.03 x 5.2 cm, ,rt ov: 4.3 x 4.3 x 4.2 cm hemorrhagic cyst w/ a thick septation,rt hydrosalpinx Left ovary 5.3 x 4.5 x 3.8 cm, ,simple lt ov cyst 4.3 x 3.6 x 3.6cm US PELVIS TA/TV: heterogenous anteverted uterus w/a post. intramural fibroid 2.1 x 1.9 x 2.8cm,rt ov: 4.3 x 4.3 x 4.2 cm hemorrhagic cyst w/ a thick septation,rt hydrosalpinx,simple lt ov cyst 4.3 x 3.6 x 3.6cm,both ov's  appears to be mobile w/venous and art flow seen,bilat adnexal pain during ultrasound,EEC 4.16mm Technician Comments: US PELVIS TA/TV: heterogenous anteverted uterus w/a post. intramural fibroid 2.1 x 1.9 x 2.8cm,rt ov: 4.3 x 4.3 x 4.2 cm hemorrhagic cyst w/ a thick septation,rt hydrosalpinx,simple lt ov cyst 4.3 x 3.6 x 3.6cm,both ov's appears to be mobile w/venous and art flow seen,bilat adnexal pain during ultrasound,EEC 4.43mm Amber J Carl 04/19/2015 11:20 AM Clinical Impression and recommendations: I have reviewed the sonogram results above, combined with the patient's current clinical course, below are my impressions and any appropriate recommendations for management based on the sonographic findings. Hemorrhagic cyst right ovary with post tubal hydrosalpinx Simple cyst left ovary Uterus clinically insignificant myoma Nancy [name],Nancy [name] 04/19/2015 11:30 AM   US Pelvis Complete  04/19/2015 GYNECOLOGIC SONOGRAM Nancy [name] is a 49 y.o. s/p ablasion for a pelvic sonogram for pelvic pain,rt ov cyst seen on MRI. Uterus 9.3 x 4.8 x 4.44 cm, heterogenous anteverted uterus w/a post. intramural fibroid 2.1 x 1.9 x 2.8cm Endometrium 4.8 mm, symmetrical, wnl Right ovary 5.8 x 5.03 x 5.2 cm, ,rt ov: 4.3 x 4.3 x 4.2 cm hemorrhagic cyst w/ a thick septation,rt hydrosalpinx Left ovary 5.3 x 4.5 x 3.8 cm, ,simple lt ov cyst 4.3 x 3.6 x 3.6cm US PELVIS  TA/TV: heterogenous anteverted uterus w/a post. intramural fibroid 2.1 x 1.9 x 2.8cm,rt ov: 4.3 x 4.3 x 4.2 cm hemorrhagic cyst w/ a thick septation,rt hydrosalpinx,simple lt ov cyst 4.3 x 3.6 x 3.6cm,both ov's appears to be mobile w/venous and art flow seen,bilat adnexal pain during ultrasound,EEC 4.16mm Technician Comments: US PELVIS TA/TV: heterogenous anteverted uterus w/a post. intramural fibroid 2.1 x 1.9 x 2.8cm,rt ov: 4.3 x 4.3 x 4.2 cm hemorrhagic cyst w/ a thick septation,rt hydrosalpinx,simple lt ov cyst 4.3 x 3.6 x 3.6cm,both ov's appears to be mobile w/venous and art flow seen,bilat adnexal pain during ultrasound,EEC 4.22mm Amber J Carl 04/19/2015 11:20 AM Clinical Impression and recommendations: I have reviewed the sonogram results above, combined with the patient's current clinical course, below are my impressions and any appropriate recommendations for management based on the sonographic findings. Hemorrhagic cyst right ovary with post tubal hydrosalpinx Simple cyst left ovary Uterus clinically insignificant myoma Nancy [name],Nancy [name] 04/19/2015 11:30 AM       Assessment: Right ovarian cyst, complex, with a right hydrosalpinx Left ovarian cyst, simple Right lower quadrant pain, worsening and pt desires definitive management   Patient Active Problem List   Diagnosis Date Noted  . Infected zoster 04/29/2013  . Hematoma 03/27/2013  . Acute postoperative pain of abdomen 12/27/2012  . Postop check 10/01/2012  . Wound seroma 08/30/2012  . Seroma 08/22/2012  . Chronic back pain 08/10/2012  . Hypothyroidism 08/10/2012  . Ventral hernia with obstruction 08/10/2012  . Rosacea 08/10/2012  . Hypokalemia 08/10/2012  . DM 02/15/2009    Plan: Laparoscopic bilateral salpingo oophorectomy  Nancy [name],Nancy [name]

## 2015-05-20 NOTE — Progress Notes (Signed)
Patient ID: Nancy [name], female   DOB: [birth year], 49 y.o.   MRN: [id number]  HPI: Patient returns for routine postoperative follow-up having undergone laparoscopic bilateral salpingo oophorectomy on 05/12/2015.  The patient's immediate postoperative recovery has been unremarkable. Since hospital discharge the patient reports usual post op pain but overall doing.   Current Outpatient Prescriptions: atorvastatin (LIPITOR) 10 MG tablet, Take 10 mg by mouth every other day. , Disp: , Rfl:  Canagliflozin (INVOKANA) 300 MG TABS, Take 300 mg by mouth every morning. Before breakfast, Disp: , Rfl:  Cholecalciferol 5000 UNITS capsule, Take 5,000 Units by mouth every morning. Vitamin D, Disp: , Rfl:  enalapril (VASOTEC) 5 MG tablet, Take 5 mg by mouth at bedtime.  , Disp: , Rfl:  insulin glargine (LANTUS SOLOSTAR) 100 UNIT/ML injection, Inject 15-20 Units into the skin 2 (two) times daily. , Disp: , Rfl:  levothyroxine (SYNTHROID, LEVOTHROID) 150 MCG tablet, Take 150 mcg by mouth daily before breakfast., Disp: , Rfl:  Liraglutide (VICTOZA) 18 MG/3ML SOLN, Inject 1.8 mg into the skin at bedtime. , Disp: , Rfl:  metFORMIN (GLUCOPHAGE) 1000 MG tablet, Take 1 tablet (1,000 mg total) by mouth 2 (two) times daily., Disp: , Rfl:  oxyCODONE-acetaminophen (PERCOCET) 7.5-325 MG per tablet, Take 1-2 tablets by mouth every 6 (six) hours as needed., Disp: 30 tablet, Rfl: 0 aspirin 81 MG EC tablet, Take 81 mg by mouth every evening. , Disp: , Rfl:  doxycycline (VIBRA-TABS) 100 MG tablet, Take 1 tablet (100 mg total) by mouth 2 (two) times daily. For rosacea (Patient not taking: Reported on 05/20/2015), Disp: 24 tablet, Rfl: 1 ketorolac (TORADOL) 10 MG tablet, Take 1 tablet (10 mg total) by mouth every 8 (eight) hours as needed. (Patient not taking: Reported on 05/20/2015), Disp: 15 tablet, Rfl: 0 methocarbamol (ROBAXIN) 500 MG tablet, Take 500 mg by mouth every 6 (six) hours as needed for muscle spasms. For muscle spasms,  Disp: , Rfl:  metroNIDAZOLE (METROCREAM) 0.75 % cream, Apply 1 application topically 2 (two) times daily as needed. For rosacea , Disp: , Rfl:  ondansetron (ZOFRAN) 8 MG tablet, Take 1 tablet (8 mg total) by mouth every 8 (eight) hours as needed for nausea. (Patient not taking: Reported on 05/20/2015), Disp: 12 tablet, Rfl: 0  No current facility-administered medications for this visit.    Blood pressure 120/80, pulse 76, weight 219 lb (99.338 kg).  Physical Exam: Incisions x 3 look good gentian violet placed  Diagnostic Tests: None today  Pathology: Benign ovarian cysts and hydrosalpinx  Impression: S/P laparoscopic bilateral oophorectomy  Plan:   Follow up: prn  Or 1 year  Florian Buff, MD

## 2015-05-25 ENCOUNTER — Encounter: Payer: Medicare Other | Admitting: Obstetrics & Gynecology

## 2015-05-27 DIAGNOSIS — E1149 Type 2 diabetes mellitus with other diabetic neurological complication: Secondary | ICD-10-CM | POA: Diagnosis not present

## 2015-05-27 DIAGNOSIS — Z6834 Body mass index (BMI) 34.0-34.9, adult: Secondary | ICD-10-CM | POA: Diagnosis not present

## 2015-05-27 DIAGNOSIS — Z23 Encounter for immunization: Secondary | ICD-10-CM | POA: Diagnosis not present

## 2015-05-27 DIAGNOSIS — E1129 Type 2 diabetes mellitus with other diabetic kidney complication: Secondary | ICD-10-CM | POA: Diagnosis not present

## 2015-06-16 NOTE — Op Note (Signed)
Preoperative diagnosis:  1.  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  normal preoperative CA 125                                         3.  RLQ pain, worsening                                         4.  Left ovarian cyst  Postoperative diagnosis:  Same as above  Procedure:  Laparoscopic bilateral salpingo-oophorectomy  Surgeon:  Jacelyn Grip MD  Anesthesia:  Gen. Endotracheal  Findings:  The patient was seen approximately one month ago for a several week history of right lower quadrant pain in the right lower back pain.  The patient stated it felt as if something was falling in her lower abdomen.  A pelvic sonogram was performed in the office and revealed a 4-1/2 cm complex right ovarian mass, hydrosalpinx vs TOA.  Her preoperative CA 125 was normal.  The left ovary also had a cyst and pt wanted that removed as well.    Intraoperatively,  the right ovary were fairly part of a large complex mass consistent with a tubo-ovarian abscess in the past which it burned out.  It was densely adherent to the pelvic sidewall small bowel and the posterior uterus and the cul-de-sac.  It was a very difficult dissection.  Left ovary was also adherent to the posterior uterine wall the pelvic sidewall and the rectosigmoid.  It also appeared that it been involved with infection at some time in the past and was a burned out TOA.  Description of operation:  The patient was taken to the operating room and placed in the supine position where she underwent general endotracheal anesthesia.  She was placed in the low lithotomy position.  She was prepped and draped in the usual sterile fashion and a Foley catheter was placed.  An incision was made in the umbilicus and a Veres needle was placed into the peritoneal cavity with one pass without difficulty.  The peritoneal cavity was then insufflated.  An 11 mm non-bladed direct visualization trocar was then used and placed  into the peritoneal cavity with direct laparoscopic visualization again without difficulty.  Incisions were then made in the left lower quadrant and right lower quadrant.  A 5 mm trocar was placed in the left lower quadrant and a 5 mm trocar was placed in the right lower quadrant.  Both were done under direct visualization without difficulty.  The right ovary was grasped.  A large amount of tedious dissection was performed on the right using traction and the harmonic scalpel.  It probably took me and I were at least to safely be able to liberate the right adnexal complex from the bowel and pelvic sidewall.  I had identified the ureter and was careful not to injure the small bowel.   The harmonic scalpel was used and the infundibulopelvic ligament on the right was coagulated and then transected.  He remaining peritoneal attachments of the right ovary were also taken  down hemostatically.  The left ovary was identified and again the infundibulopelvic ligament on the left was coagulated and transected.  The left ovary after this was dissected off the rectosigmoid and the uterus .  The left ovary probably took me name weren't 15 minutes to dissect it was much easier than the right side .  But again both sides appeared to abandon involved at some point in a tubo-ovarian abscess process .  The left ovary was removed hemostatically as well.  An Endo Catch was placed in the right ovary was put in the bag and removed through the left lower quadrant incision without difficulty.  He left ovary was also removed without difficulty.   The subcutaneous tissue was then reapproximated with 0 Vicryl. The skin was closed using skin staples.  The umbilical fascia was closed with a single 0 Vicryl suture.  The subcutaneous tissue was reapproximated loosely using 0 Vicryl.  The skin was closed using skin staples.  The patient was awakened from anesthesia and taken to the recovery room in good stable condition with all counts being  correct x3.  The estimated blood loss for the procedure is 100 cc.  The patient received ancef and toradol preoperatively.  X per L was injected in the incision sites  She will be discharged from the recovery room time and seen next week for staple and suture removal.  EURE,LUTHER H 05/12/2015 01:46 PM

## 2015-06-18 DIAGNOSIS — E038 Other specified hypothyroidism: Secondary | ICD-10-CM | POA: Diagnosis not present

## 2015-06-18 DIAGNOSIS — E782 Mixed hyperlipidemia: Secondary | ICD-10-CM | POA: Diagnosis not present

## 2015-06-18 DIAGNOSIS — E1165 Type 2 diabetes mellitus with hyperglycemia: Secondary | ICD-10-CM | POA: Diagnosis not present

## 2015-06-18 DIAGNOSIS — E1122 Type 2 diabetes mellitus with diabetic chronic kidney disease: Secondary | ICD-10-CM | POA: Diagnosis not present

## 2015-06-21 ENCOUNTER — Ambulatory Visit: Payer: Medicare Other | Admitting: Obstetrics & Gynecology

## 2015-06-22 DIAGNOSIS — E1165 Type 2 diabetes mellitus with hyperglycemia: Secondary | ICD-10-CM | POA: Diagnosis not present

## 2015-06-22 DIAGNOSIS — E1122 Type 2 diabetes mellitus with diabetic chronic kidney disease: Secondary | ICD-10-CM | POA: Diagnosis not present

## 2015-06-22 DIAGNOSIS — E782 Mixed hyperlipidemia: Secondary | ICD-10-CM | POA: Diagnosis not present

## 2015-06-22 DIAGNOSIS — E559 Vitamin D deficiency, unspecified: Secondary | ICD-10-CM | POA: Diagnosis not present

## 2015-06-22 DIAGNOSIS — E282 Polycystic ovarian syndrome: Secondary | ICD-10-CM | POA: Diagnosis not present

## 2015-06-22 DIAGNOSIS — E038 Other specified hypothyroidism: Secondary | ICD-10-CM | POA: Diagnosis not present

## 2015-09-16 ENCOUNTER — Encounter: Payer: Self-pay | Admitting: Obstetrics & Gynecology

## 2015-09-16 ENCOUNTER — Ambulatory Visit (INDEPENDENT_AMBULATORY_CARE_PROVIDER_SITE_OTHER): Payer: Medicare Other | Admitting: Obstetrics & Gynecology

## 2015-09-16 VITALS — BP 120/80 | HR 80 | Wt 218.0 lb

## 2015-09-16 DIAGNOSIS — K5733 Diverticulitis of large intestine without perforation or abscess with bleeding: Secondary | ICD-10-CM

## 2015-09-16 MED ORDER — METRONIDAZOLE 500 MG PO TABS: 500.0000 mg | ORAL_TABLET | Freq: Two times a day (BID) | ORAL | Status: DC

## 2015-09-16 MED ORDER — CIPROFLOXACIN HCL 500 MG PO TABS: 500.0000 mg | ORAL_TABLET | Freq: Two times a day (BID) | ORAL | Status: DC

## 2015-09-16 MED ORDER — HYDROCODONE-ACETAMINOPHEN 5-325 MG PO TABS: 1.0000 | ORAL_TABLET | Freq: Four times a day (QID) | ORAL | Status: DC | PRN

## 2015-09-16 NOTE — Progress Notes (Signed)
Patient ID: Nancy [name], female   DOB: Oct 01, 1965, 50 y.o.   MRN: [id number]      Chief Complaint  Patient presents with  . Abdominal Pain    pain not going away since surgery    Blood pressure 120/80, pulse 80, weight 218 lb (98.884 kg).  50 y.o. No obstetric history on file. No LMP recorded. Patient has had an ablation. The current method of family planning is bilateral oophorectomy.  Subjective Pt had bilateral oophorectomy last September and her pain improved drmatically, maybe never got back to absolutely no pain However 2 weeks ago began to have a significant increase in her pain, dramatic worsening last night which prompted her call  No fever No N/V/D, no decrease in apetite Blood sugars stable  Objective  Abdomen soft tender LLQ>>RLQ, guarding no rebound NEFG No CMT Uterus non tendrr  no masses Adnexa minimally tender  Pertinent ROS As above  Labs or studies pending    Impression Diagnoses this Encounter::   ICD-9-CM ICD-10-CM   1. Diverticulitis of large intestine without perforation or abscess with bleeding 562.13 K57.33 CBC w/Diff/Platelet     Comprehensive Metabolic Panel (CMET)    Established relevant diagnosis(es): diabetes  Plan/Recommendations: Meds ordered this encounter  Medications  . ciprofloxacin (CIPRO) 500 MG tablet    Sig: Take 1 tablet (500 mg total) by mouth 2 (two) times daily.    Dispense:  20 tablet    Refill:  0  . metroNIDAZOLE (FLAGYL) 500 MG tablet    Sig: Take 1 tablet (500 mg total) by mouth 2 (two) times daily.    Dispense:  20 tablet    Refill:  0  . HYDROcodone-acetaminophen (NORCO/VICODIN) 5-325 MG tablet    Sig: Take 1 tablet by mouth every 6 (six) hours as needed.    Dispense:  30 tablet    Refill:  0    Labs or Scans Ordered: Orders Placed This Encounter  Procedures  . CBC w/Diff/Platelet  . Comprehensive Metabolic Panel (CMET)    Management:: Presumptive diverticulits, if this were adhesions with  small bowel issues I would expect N/V more diffuse pain crampy Her pain is dramaticlly LLQ so I am empirically going to tret her for diverticulitis, do not get the impression that this is a perforation so will try to cool it down then CT if needed CBC CMP ordered  Follow up 1  weeks        All questions were answered.

## 2015-09-17 LAB — COMPREHENSIVE METABOLIC PANEL
A/G RATIO: 1.6 (ref 1.1–2.5)
ALT: 34 IU/L — ABNORMAL HIGH (ref 0–32)
AST: 28 IU/L (ref 0–40)
Albumin: 4.7 g/dL (ref 3.5–5.5)
Alkaline Phosphatase: 74 IU/L (ref 39–117)
BUN/Creatinine Ratio: 30 — ABNORMAL HIGH (ref 9–23)
BUN: 14 mg/dL (ref 6–24)
Bilirubin Total: 0.9 mg/dL (ref 0.0–1.2)
CALCIUM: 10.2 mg/dL (ref 8.7–10.2)
CO2: 22 mmol/L (ref 18–29)
CREATININE: 0.47 mg/dL — AB (ref 0.57–1.00)
Chloride: 101 mmol/L (ref 96–106)
GFR, EST AFRICAN AMERICAN: 134 mL/min/{1.73_m2} (ref >59)
GFR, EST NON AFRICAN AMERICAN: 116 mL/min/{1.73_m2} (ref >59)
GLOBULIN, TOTAL: 2.9 g/dL (ref 1.5–4.5)
Glucose: 102 mg/dL — ABNORMAL HIGH (ref 65–99)
POTASSIUM: 4.3 mmol/L (ref 3.5–5.2)
SODIUM: 144 mmol/L (ref 134–144)
TOTAL PROTEIN: 7.6 g/dL (ref 6.0–8.5)

## 2015-09-17 LAB — CBC WITH DIFFERENTIAL/PLATELET
BASOS: 0 %
Basophils Absolute: 0 10*3/uL (ref 0.0–0.2)
EOS (ABSOLUTE): 0.1 10*3/uL (ref 0.0–0.4)
EOS: 1 %
HEMATOCRIT: 46.9 % — AB (ref 34.0–46.6)
HEMOGLOBIN: 15.8 g/dL (ref 11.1–15.9)
IMMATURE GRANS (ABS): 0 10*3/uL (ref 0.0–0.1)
IMMATURE GRANULOCYTES: 0 %
Lymphocytes Absolute: 2.6 10*3/uL (ref 0.7–3.1)
Lymphs: 27 %
MCH: 30 pg (ref 26.6–33.0)
MCHC: 33.7 g/dL (ref 31.5–35.7)
MCV: 89 fL (ref 79–97)
Monocytes Absolute: 0.7 10*3/uL (ref 0.1–0.9)
Monocytes: 7 %
NEUTROS PCT: 65 %
Neutrophils Absolute: 6.5 10*3/uL (ref 1.4–7.0)
Platelets: 297 10*3/uL (ref 150–379)
RBC: 5.27 x10E6/uL (ref 3.77–5.28)
RDW: 13.8 % (ref 12.3–15.4)
WBC: 9.9 10*3/uL (ref 3.4–10.8)

## 2015-09-20 DIAGNOSIS — E1165 Type 2 diabetes mellitus with hyperglycemia: Secondary | ICD-10-CM | POA: Diagnosis not present

## 2015-09-20 DIAGNOSIS — E038 Other specified hypothyroidism: Secondary | ICD-10-CM | POA: Diagnosis not present

## 2015-09-20 DIAGNOSIS — E782 Mixed hyperlipidemia: Secondary | ICD-10-CM | POA: Diagnosis not present

## 2015-09-23 ENCOUNTER — Encounter: Payer: Self-pay | Admitting: Obstetrics & Gynecology

## 2015-09-23 ENCOUNTER — Ambulatory Visit (INDEPENDENT_AMBULATORY_CARE_PROVIDER_SITE_OTHER): Payer: Medicare Other | Admitting: Obstetrics & Gynecology

## 2015-09-23 VITALS — BP 110/80 | HR 78 | Wt 218.0 lb

## 2015-09-23 DIAGNOSIS — K5733 Diverticulitis of large intestine without perforation or abscess with bleeding: Secondary | ICD-10-CM

## 2015-09-23 DIAGNOSIS — E038 Other specified hypothyroidism: Secondary | ICD-10-CM | POA: Diagnosis not present

## 2015-09-23 DIAGNOSIS — E1165 Type 2 diabetes mellitus with hyperglycemia: Secondary | ICD-10-CM | POA: Diagnosis not present

## 2015-09-23 DIAGNOSIS — E559 Vitamin D deficiency, unspecified: Secondary | ICD-10-CM | POA: Diagnosis not present

## 2015-09-23 DIAGNOSIS — E1122 Type 2 diabetes mellitus with diabetic chronic kidney disease: Secondary | ICD-10-CM | POA: Diagnosis not present

## 2015-09-23 DIAGNOSIS — E282 Polycystic ovarian syndrome: Secondary | ICD-10-CM | POA: Diagnosis not present

## 2015-09-23 DIAGNOSIS — E782 Mixed hyperlipidemia: Secondary | ICD-10-CM | POA: Diagnosis not present

## 2015-09-23 NOTE — Progress Notes (Signed)
Patient ID: Nancy [name], female   DOB: Dec 28, 1965, 50 y.o.   MRN: [id number]      Chief Complaint  Patient presents with  . Follow-up    pain is better, not gone away.    Blood pressure 110/80, pulse 78, weight 218 lb (98.884 kg).  50 y.o. No obstetric history on file. No LMP recorded. Patient has had an ablation. The current method of family planning is both ovries removed  Subjective Pt states she feels better but still not right Some watery BM this week on antibiotics, recommend no dairy products for now Taking the cipro and flagyl No fever  No vomiting some nausea, appetite diminished No blood in stool  Objective gen look better thn last week but still not at baseline Abdomen soft much less tender no guarding no rebound  Pertinent ROS   Labs or studies Labs were reviewed  Results for orders placed or performed in visit on 09/16/15 (from the past 336 hour(s))  CBC w/Diff/Platelet   Collection Time: 09/16/15 11:49 AM  Result Value Ref Range   WBC 9.9 3.4 - 10.8 x10E3/uL   RBC 5.27 3.77 - 5.28 x10E6/uL   Hemoglobin 15.8 11.1 - 15.9 g/dL   Hematocrit 46.9 (H) 34.0 - 46.6 %   MCV 89 79 - 97 fL   MCH 30.0 26.6 - 33.0 pg   MCHC 33.7 31.5 - 35.7 g/dL   RDW 13.8 12.3 - 15.4 %   Platelets 297 150 - 379 x10E3/uL   Neutrophils 65 %   Lymphs 27 %   Monocytes 7 %   Eos 1 %   Basos 0 %   Neutrophils Absolute 6.5 1.4 - 7.0 x10E3/uL   Lymphocytes Absolute 2.6 0.7 - 3.1 x10E3/uL   Monocytes Absolute 0.7 0.1 - 0.9 x10E3/uL   EOS (ABSOLUTE) 0.1 0.0 - 0.4 x10E3/uL   Basophils Absolute 0.0 0.0 - 0.2 x10E3/uL   Immature Granulocytes 0 %   Immature Grans (Abs) 0.0 0.0 - 0.1 x10E3/uL  Comprehensive Metabolic Panel (CMET)   Collection Time: 09/16/15 11:49 AM  Result Value Ref Range   Glucose 102 (H) 65 - 99 mg/dL   BUN 14 6 - 24 mg/dL   Creatinine, Ser 0.47 (L) 0.57 - 1.00 mg/dL   GFR calc non Af Amer 116 >59 mL/min/1.73   GFR calc Af Amer 134 >59 mL/min/1.73   BUN/Creatinine Ratio 30 (H) 9 - 23   Sodium 144 134 - 144 mmol/L   Potassium 4.3 3.5 - 5.2 mmol/L   Chloride 101 96 - 106 mmol/L   CO2 22 18 - 29 mmol/L   Calcium 10.2 8.7 - 10.2 mg/dL   Total Protein 7.6 6.0 - 8.5 g/dL   Albumin 4.7 3.5 - 5.5 g/dL   Globulin, Total 2.9 1.5 - 4.5 g/dL   Albumin/Globulin Ratio 1.6 1.1 - 2.5   Bilirubin Total 0.9 0.0 - 1.2 mg/dL   Alkaline Phosphatase 74 39 - 117 IU/L   AST 28 0 - 40 IU/L   ALT 34 (H) 0 - 32 IU/L         Impression Diagnoses this Encounter::   ICD-9-CM ICD-10-CM   1. Diverticulitis of large intestine without perforation or abscess with bleeding 562.13 K57.33 CT Abdomen Pelvis W Contrast    Established relevant diagnosis(es):   Plan/Recommendations: No orders of the defined types were placed in this encounter.    Labs or Scans Ordered: Orders Placed This Encounter  Procedures  . CT Abdomen Pelvis W Contrast  Management:: Also could be adhesions nut not presenting like a SBO or other significant adhesion complication, op note reveals severe adhesions with evidence of previous pelvic infections that pt was unaware of  Follow up  Return in about 1 week (around 09/30/2015) for Follow up, with Dr Elonda Husky.        Face to face time:  15 minutes  Greater than 50% of the visit time was spent in counseling and coordination of care with the patient.  The summary and outline of the counseling and care coordination is summarized in the note above.   All questions were answered.

## 2015-09-27 ENCOUNTER — Ambulatory Visit (HOSPITAL_COMMUNITY)
Admission: RE | Admit: 2015-09-27 | Discharge: 2015-09-27 | Disposition: A | Discharge status: Home / Self Care | Payer: Medicare Other | Source: Ambulatory Visit | Attending: Obstetrics & Gynecology | Admitting: Obstetrics & Gynecology

## 2015-09-27 DIAGNOSIS — R1030 Lower abdominal pain, unspecified: Secondary | ICD-10-CM | POA: Diagnosis not present

## 2015-09-27 DIAGNOSIS — K439 Ventral hernia without obstruction or gangrene: Secondary | ICD-10-CM | POA: Insufficient documentation

## 2015-09-27 DIAGNOSIS — K5733 Diverticulitis of large intestine without perforation or abscess with bleeding: Secondary | ICD-10-CM | POA: Diagnosis not present

## 2015-09-27 DIAGNOSIS — R110 Nausea: Secondary | ICD-10-CM | POA: Insufficient documentation

## 2015-09-27 MED ORDER — IOHEXOL 300 MG/ML  SOLN
100.0000 mL | Freq: Once | INTRAMUSCULAR | Status: AC | PRN
  Administered 2015-09-27: 100 mL via INTRAVENOUS

## 2015-09-30 ENCOUNTER — Ambulatory Visit: Payer: Medicare Other | Admitting: Obstetrics & Gynecology

## 2015-10-04 DIAGNOSIS — Z6834 Body mass index (BMI) 34.0-34.9, adult: Secondary | ICD-10-CM | POA: Diagnosis not present

## 2015-10-04 DIAGNOSIS — R1031 Right lower quadrant pain: Secondary | ICD-10-CM | POA: Diagnosis not present

## 2015-10-04 DIAGNOSIS — E119 Type 2 diabetes mellitus without complications: Secondary | ICD-10-CM | POA: Diagnosis not present

## 2015-10-18 DIAGNOSIS — E782 Mixed hyperlipidemia: Secondary | ICD-10-CM | POA: Diagnosis not present

## 2015-10-18 DIAGNOSIS — E038 Other specified hypothyroidism: Secondary | ICD-10-CM | POA: Diagnosis not present

## 2015-10-18 DIAGNOSIS — E1122 Type 2 diabetes mellitus with diabetic chronic kidney disease: Secondary | ICD-10-CM | POA: Diagnosis not present

## 2015-10-18 DIAGNOSIS — E1165 Type 2 diabetes mellitus with hyperglycemia: Secondary | ICD-10-CM | POA: Diagnosis not present

## 2015-10-18 DIAGNOSIS — E559 Vitamin D deficiency, unspecified: Secondary | ICD-10-CM | POA: Diagnosis not present

## 2015-10-18 DIAGNOSIS — E282 Polycystic ovarian syndrome: Secondary | ICD-10-CM | POA: Diagnosis not present

## 2015-10-26 ENCOUNTER — Other Ambulatory Visit: Payer: Self-pay | Admitting: Obstetrics & Gynecology

## 2015-10-26 DIAGNOSIS — Z1231 Encounter for screening mammogram for malignant neoplasm of breast: Secondary | ICD-10-CM

## 2015-12-01 ENCOUNTER — Ambulatory Visit (HOSPITAL_COMMUNITY)
Admission: RE | Admit: 2015-12-01 | Discharge: 2015-12-01 | Disposition: A | Discharge status: Home / Self Care | Payer: Medicare Other | Source: Ambulatory Visit | Attending: Obstetrics & Gynecology | Admitting: Obstetrics & Gynecology

## 2015-12-01 DIAGNOSIS — Z1231 Encounter for screening mammogram for malignant neoplasm of breast: Secondary | ICD-10-CM | POA: Insufficient documentation

## 2015-12-21 DIAGNOSIS — E782 Mixed hyperlipidemia: Secondary | ICD-10-CM | POA: Diagnosis not present

## 2015-12-21 DIAGNOSIS — E559 Vitamin D deficiency, unspecified: Secondary | ICD-10-CM | POA: Diagnosis not present

## 2015-12-21 DIAGNOSIS — E1165 Type 2 diabetes mellitus with hyperglycemia: Secondary | ICD-10-CM | POA: Diagnosis not present

## 2015-12-21 DIAGNOSIS — E038 Other specified hypothyroidism: Secondary | ICD-10-CM | POA: Diagnosis not present

## 2015-12-31 DIAGNOSIS — E1122 Type 2 diabetes mellitus with diabetic chronic kidney disease: Secondary | ICD-10-CM | POA: Diagnosis not present

## 2015-12-31 DIAGNOSIS — E782 Mixed hyperlipidemia: Secondary | ICD-10-CM | POA: Diagnosis not present

## 2015-12-31 DIAGNOSIS — E559 Vitamin D deficiency, unspecified: Secondary | ICD-10-CM | POA: Diagnosis not present

## 2015-12-31 DIAGNOSIS — E038 Other specified hypothyroidism: Secondary | ICD-10-CM | POA: Diagnosis not present

## 2015-12-31 DIAGNOSIS — E1165 Type 2 diabetes mellitus with hyperglycemia: Secondary | ICD-10-CM | POA: Diagnosis not present

## 2015-12-31 DIAGNOSIS — E282 Polycystic ovarian syndrome: Secondary | ICD-10-CM | POA: Diagnosis not present

## 2016-01-18 DIAGNOSIS — E119 Type 2 diabetes mellitus without complications: Secondary | ICD-10-CM | POA: Diagnosis not present

## 2016-01-18 DIAGNOSIS — Z794 Long term (current) use of insulin: Secondary | ICD-10-CM | POA: Diagnosis not present

## 2016-02-03 DIAGNOSIS — L718 Other rosacea: Secondary | ICD-10-CM | POA: Diagnosis not present

## 2016-02-03 DIAGNOSIS — E1149 Type 2 diabetes mellitus with other diabetic neurological complication: Secondary | ICD-10-CM | POA: Diagnosis not present

## 2016-02-03 DIAGNOSIS — C44319 Basal cell carcinoma of skin of other parts of face: Secondary | ICD-10-CM | POA: Diagnosis not present

## 2016-02-03 DIAGNOSIS — K439 Ventral hernia without obstruction or gangrene: Secondary | ICD-10-CM | POA: Diagnosis not present

## 2016-03-07 DIAGNOSIS — K439 Ventral hernia without obstruction or gangrene: Secondary | ICD-10-CM | POA: Diagnosis not present

## 2016-03-07 NOTE — Patient Instructions (Signed)
SHEKELIA AGOPIAN  03/07/2016     @PREFPERIOPPHARMACY @   Your procedure is scheduled on  03/13/2016 .  Report to [facility] at  [place]  A.M.  Call this number if you have problems the morning of surgery:  [phone]   Remember:  Do not eat food or drink liquids after midnight.  Take these medicines the morning of surgery with A SIP OF WATER  Vasotec, synthroid. Take 1/2 of your night time insulin dosage. DO NOT take any medicines for diabetes the morning of your surgery.   Do not wear jewelry, make-up or nail polish.  Do not wear lotions, powders, or perfumes.  You may wear deoderant.  Do not shave 48 hours prior to surgery.  Men may shave face and neck.  Do not bring valuables to the hospital.  [facility] is not responsible for any belongings or valuables.  Contacts, dentures or bridgework may not be worn into surgery.  Leave your suitcase in the car.  After surgery it may be brought to your room.  For patients admitted to the hospital, discharge time will be determined by your treatment team.  Patients discharged the day of surgery will not be allowed to drive home.   Name and phone number of your driver:   family Special instructions:  none  Please read over the following fact sheets that you were given. Coughing and Deep Breathing, Surgical Site Infection Prevention, Anesthesia Post-op Instructions and Care and Recovery After Surgery      Open Hernia Repair Open hernia repair is surgery to fix a hernia. A hernia occurs when an internal organ or tissue pushes out through a weak spot in the abdominal wall muscles. Hernias commonly occur in the groin and around the navel. Most hernias tend to get worse over time. Surgery is often done to prevent the hernia from getting bigger, becoming uncomfortable, or becoming an emergency. Emergency surgery may be needed if abdominal contents get stuck in the opening (incarcerated hernia) or the blood supply gets cut off  (strangulated hernia). In an open repair, a large cut (incision) is made in the abdomen to perform the surgery. LET [facility] CARE PROVIDER KNOW ABOUT:  Any allergies you have.  All medicines you are taking, including vitamins, herbs, eye drops, creams, and over-the-counter medicines.  Previous problems you or members of your family have had with the use of anesthetics.  Any blood disorders you have.  Previous surgeries you have had.  Medical conditions you have. RISKS AND COMPLICATIONS Generally, this is a safe procedure. However, as with any procedure, complications can occur. Possible complications include:  Infection.  Bleeding.  Nerve injury.  Chronic pain.  The hernia can come back.  Injury to the intestines. BEFORE THE PROCEDURE  Ask your health care provider about changing or stopping any regular medicines. Avoid taking aspirin or blood thinners as directed by your health care provider.  Do noteat or drink anything after midnight the night before surgery.  If you smoke, do not smoke for at least 2 weeks before your surgery.  Do not drink alcohol the day before your surgery.  Let your health care provider know if you develop a cold or any infection before your surgery.  Arrange for someone to drive you home after the procedure or after your hospital stay. Also arrange for someone to help you with activities during recovery. PROCEDURE   Small monitors will be put on your body. They  are used to check your heart, blood pressure, and oxygen level.   An IV access tube will be put into one of your veins. Medicine will be able to flow directly into your body through this IV tube.   You might be given a medicine to help you relax (sedative).   You will be given a medicine to make you sleep (general anesthetic). A breathing tube may be placed into your lungs during the procedure.  A cut (incision) is made over the hernia defect, and the contents are pushed back  into the abdomen.  If the hernia is small, stitches may be used to bring the muscle edges back together.  Typically, a surgeon will place a mesh patch made of man-made material (synthetic) to cover the defect. The mesh is sewn to healthy muscle. This reduces the risk of the hernia coming back.  The tissue and skin over the hernia are then closed with stitches or staples.  If the hernia was large, a drain may be left in place to collect excess fluid where the hernia used to be.  Bandages (dressings) are used to cover the incision. AFTER THE PROCEDURE  You will be taken to a recovery area where your progress will be monitored.  If the hernia was small or in the groin (inguinal) region, you will likely be allowed to go home once you are awake, stable, and taking fluids well.  If the hernia was large, you may have to wait for your bowel function to return. You may need to stay in the hospital for 2-3 days until you can eat and your pain is controlled. A drain may be left in place for 5-7 days. You will be taught how to care for the drain.   This information is not intended to replace advice given to you by your health care provider. Make sure you discuss any questions you have with your health care provider.   Document Released: 01/31/2001 Document Revised: 05/28/2013 Document Reviewed: 03/19/2013 Elsevier Interactive Patient Education 2016 [place]. Open Hernia Repair, Care After These instructions give you information about caring for yourself after your procedure. Your doctor may also give you more specific instructions. Call your doctor if you have any problems or questions after your procedure. HOME CARE  Keep the cut (incision) area clean and dry. You may gently wash the incision area with soap and water 48 hours after surgery. To dry the incision area, gently blot or dab it.  Do not take baths, swim, or use a hot tub for 10 days or until your doctor approves.  Change bandages  (dressings) as told by your doctor.  Check your incision area every day for signs of infection. Watch for:  Redness, swelling, or pain.  Fluid , blood, or pus.  Eat plenty of fruits and vegetables. This helps to prevent constipation.  Drink enough fluid to keep your pee (urine) clear or pale yellow. This also helps to prevent constipation.  Do not drive or operate heavy machinery until your doctor says it is okay.  Do not lift anything that is heavier than 10 lb (4.5 kg) until your doctor approves.  Do not play contact sports for 4 weeks or until your doctor approves.  Take medicines only as told by your doctor.  Keep all follow-up visits as told by your doctor. This is important. Ask your doctor when to make an appointment to have your stitches (sutures) or staples removed. GET HELP IF:  The incision is bleeding  more than before.  You have blood in your poop (stool).  The incision hurts more than before.  You have redness, swelling, or pain in your incision area.  You have fluid, blood, or pus coming from your incision.  You have a fever.  You notice a bad smell coming from the incision area or the dressing. GET HELP RIGHT AWAY IF:  You have a rash.  Your chest hurts.  You are short of breath.  You feel light-headed.  You feel weak and dizzy (feel faint).   This information is not intended to replace advice given to you by your health care provider. Make sure you discuss any questions you have with your health care provider.   Document Released: 08/28/2014 Document Reviewed: 08/28/2014 Elsevier Interactive Patient Education 2016 [employer] PATIENT INSTRUCTIONS POST-ANESTHESIA  IMMEDIATELY FOLLOWING SURGERY:  Do not drive or operate machinery for the first twenty four hours after surgery.  Do not make any important decisions for twenty four hours after surgery or while taking narcotic pain medications or sedatives.  If you develop intractable nausea and  vomiting or a severe headache please notify your doctor immediately.  FOLLOW-UP:  Please make an appointment with your surgeon as instructed. You do not need to follow up with anesthesia unless specifically instructed to do so.  WOUND CARE INSTRUCTIONS (if applicable):  Keep a dry clean dressing on the anesthesia/puncture wound site if there is drainage.  Once the wound has quit draining you may leave it open to air.  Generally you should leave the bandage intact for twenty four hours unless there is drainage.  If the epidural site drains for more than 36-48 hours please call the anesthesia department.  QUESTIONS?:  Please feel free to call your physician or the hospital operator if you have any questions, and they will be happy to assist you.

## 2016-03-08 ENCOUNTER — Encounter (HOSPITAL_COMMUNITY)
Admission: RE | Admit: 2016-03-08 | Discharge: 2016-03-08 | Disposition: A | Discharge status: Home / Self Care | Payer: Medicare Other | Source: Ambulatory Visit | Attending: General Surgery | Admitting: General Surgery

## 2016-03-08 ENCOUNTER — Encounter (HOSPITAL_COMMUNITY): Payer: Self-pay

## 2016-03-08 ENCOUNTER — Other Ambulatory Visit: Payer: Self-pay

## 2016-03-08 DIAGNOSIS — E119 Type 2 diabetes mellitus without complications: Secondary | ICD-10-CM | POA: Diagnosis not present

## 2016-03-08 DIAGNOSIS — E876 Hypokalemia: Secondary | ICD-10-CM | POA: Diagnosis not present

## 2016-03-08 DIAGNOSIS — Z01812 Encounter for preprocedural laboratory examination: Secondary | ICD-10-CM | POA: Diagnosis not present

## 2016-03-08 DIAGNOSIS — E039 Hypothyroidism, unspecified: Secondary | ICD-10-CM | POA: Diagnosis not present

## 2016-03-08 LAB — CBC WITH DIFFERENTIAL/PLATELET
BASOS ABS: 0 10*3/uL (ref 0.0–0.1)
BASOS PCT: 0 %
Eosinophils Absolute: 0.1 10*3/uL (ref 0.0–0.7)
Eosinophils Relative: 1 %
HEMATOCRIT: 47 % — AB (ref 36.0–46.0)
Hemoglobin: 15.8 g/dL — ABNORMAL HIGH (ref 12.0–15.0)
Lymphocytes Relative: 24 %
Lymphs Abs: 1.9 10*3/uL (ref 0.7–4.0)
MCH: 30.4 pg (ref 26.0–34.0)
MCHC: 33.6 g/dL (ref 30.0–36.0)
MCV: 90.6 fL (ref 78.0–100.0)
Monocytes Absolute: 0.7 10*3/uL (ref 0.1–1.0)
Monocytes Relative: 8 %
NEUTROS ABS: 5.3 10*3/uL (ref 1.7–7.7)
NEUTROS PCT: 67 %
PLATELETS: 282 10*3/uL (ref 150–400)
RBC: 5.19 MIL/uL — ABNORMAL HIGH (ref 3.87–5.11)
RDW: 13.7 % (ref 11.5–15.5)
WBC: 7.9 10*3/uL (ref 4.0–10.5)

## 2016-03-08 LAB — BASIC METABOLIC PANEL
ANION GAP: 8 (ref 5–15)
BUN: 9 mg/dL (ref 6–20)
CALCIUM: 9.5 mg/dL (ref 8.9–10.3)
CO2: 25 mmol/L (ref 22–32)
Chloride: 103 mmol/L (ref 101–111)
Creatinine, Ser: 0.43 mg/dL — ABNORMAL LOW (ref 0.44–1.00)
Glucose, Bld: 106 mg/dL — ABNORMAL HIGH (ref 65–99)
POTASSIUM: 4.1 mmol/L (ref 3.5–5.1)
Sodium: 136 mmol/L (ref 135–145)

## 2016-03-08 NOTE — Pre-Procedure Instructions (Signed)
Patient given information to sign up for my chart at home. 

## 2016-03-08 NOTE — H&P (Signed)
  NTS SOAP Note  Vital Signs:  Vitals as of: [id number]: Systolic [id number]: Diastolic 82: Heart Rate 79: Temp 98.25F (Temporal): Height 45ft 6in: Weight 222Lbs 0 Ounces: Pain Level 6: BMI 35.83   BMI : 35.83 kg/m2  Subjective: This 50 year old female presents for of abdominal pain.  Has a left lower quadrant spigelian hernia present found on CT scan of the abdomen 2/17.  Has had intermittent pain, which is made worse with straining.  Somewhat resolves with lying down.  No nausea, vomiting.  No fever, chills.  Does notices fullness in that region.  Has had multiple abdominal hernias in the past.  Review of Symptoms:  Constitutional:negative Head:negative Eyes:negative Nose/Mouth/Throat:negative Cardiovascular:negative Respiratory:negative Gastrointestinabdominal pain Genitourinary:negative Musculoskeletal:negative Skin:negative Hematolgic/Lymphatic:negative Allergic/Immunologic:negative   Past Medical History:Reviewed  Past Medical History  Surgical History: abdominal/incisional herniorrhaphies, TAH, BSO, previous colostomy bag Medical Problems: IDDM, HTN, high cholesterol, hypothyroidism Allergies: sulfa Medications: metformin, enalapril, lantus insulin, invokana, synthroid, baby asa, methocarbamol   Social History:Reviewed  Social History  Preferred Language: English Race:  White Ethnicity: Not Hispanic / Latino Age: 52 year Marital Status:  S Alcohol: no   Smoking Status: Never smoker reviewed on 03/07/2016 Functional Status reviewed on 03/07/2016 ------------------------------------------------ Bathing: Normal Cooking: Normal Dressing: Normal Driving: Normal Eating: Normal Managing Meds: Normal Oral Care: Normal Shopping: Normal Toileting: Normal Transferring: Normal Walking: Normal Cognitive Status reviewed on 03/07/2016 ------------------------------------------------ Attention: Normal Decision Making: Normal Language: Normal Memory:  Normal Motor: Normal Perception: Normal Problem Solving: Normal Visual and Spatial: Normal   Family History:Reviewed  Family Health History Mother, Living; Hypothyroidism;  Father, Deceased; Hypertension (high blood pressure);     Objective Information: General:Well appearing, well nourished in no distress. Skin:no rash or prominent lesions Head:Atraumatic; no masses; no abnormalities Neck:Supple without lymphadenopathy.  Heart:RRR, no murmur or gallop.  Normal S1, S2.  No S3, S4.  Lungs:CTA bilaterally, no wheezes, rhonchi, rales.  Breathing unlabored. Abdomen:Soft, NT/ND, normal bowel sounds, no HSM, no masses.  No peritoneal signs.  Reducible left spigelian hernia present.  Assessment:Ventral/Spigelian hernia, left  Diagnoses: 553.20  K43.9 Hernia of anterior abdominal wall (Ventral hernia without obstruction or gangrene)  Procedures: [id number] - OFFICE OUTPATIENT NEW 30 MINUTES    Plan:  Scheduled for ventral herniorrhaphy with mesh on 03/13/16.   Patient Education:Alternative treatments to surgery were discussed with patient (and family).Risks and benefits  of procedure including bleeding, infection, mesh use, and the possibility of recurrence of the hernia were fully explained to the patient (and family) who gave informed consent. Patient/family questions were addressed.  Follow-up:Pending Surgery

## 2016-03-13 ENCOUNTER — Ambulatory Visit (HOSPITAL_COMMUNITY): Payer: Medicare Other | Admitting: Anesthesiology

## 2016-03-13 ENCOUNTER — Ambulatory Visit (HOSPITAL_COMMUNITY)
Admission: RE | Admit: 2016-03-13 | Discharge: 2016-03-13 | Disposition: A | Discharge status: Home / Self Care | Payer: Medicare Other | Source: Ambulatory Visit | Attending: General Surgery | Admitting: General Surgery

## 2016-03-13 ENCOUNTER — Encounter (HOSPITAL_COMMUNITY)
Admission: RE | Disposition: A | Discharge status: Home / Self Care | Payer: Self-pay | Source: Ambulatory Visit | Attending: General Surgery

## 2016-03-13 ENCOUNTER — Encounter (HOSPITAL_COMMUNITY): Payer: Self-pay | Admitting: *Deleted

## 2016-03-13 DIAGNOSIS — Z7984 Long term (current) use of oral hypoglycemic drugs: Secondary | ICD-10-CM | POA: Diagnosis not present

## 2016-03-13 DIAGNOSIS — Z794 Long term (current) use of insulin: Secondary | ICD-10-CM | POA: Insufficient documentation

## 2016-03-13 DIAGNOSIS — I10 Essential (primary) hypertension: Secondary | ICD-10-CM | POA: Diagnosis not present

## 2016-03-13 DIAGNOSIS — E039 Hypothyroidism, unspecified: Secondary | ICD-10-CM | POA: Insufficient documentation

## 2016-03-13 DIAGNOSIS — Z882 Allergy status to sulfonamides status: Secondary | ICD-10-CM | POA: Insufficient documentation

## 2016-03-13 DIAGNOSIS — Z7982 Long term (current) use of aspirin: Secondary | ICD-10-CM | POA: Diagnosis not present

## 2016-03-13 DIAGNOSIS — E7800 Pure hypercholesterolemia, unspecified: Secondary | ICD-10-CM | POA: Insufficient documentation

## 2016-03-13 DIAGNOSIS — K432 Incisional hernia without obstruction or gangrene: Secondary | ICD-10-CM | POA: Diagnosis not present

## 2016-03-13 DIAGNOSIS — Z79899 Other long term (current) drug therapy: Secondary | ICD-10-CM | POA: Insufficient documentation

## 2016-03-13 DIAGNOSIS — E119 Type 2 diabetes mellitus without complications: Secondary | ICD-10-CM | POA: Diagnosis not present

## 2016-03-13 DIAGNOSIS — K439 Ventral hernia without obstruction or gangrene: Secondary | ICD-10-CM | POA: Diagnosis present

## 2016-03-13 HISTORY — PX: VENTRAL HERNIA REPAIR: SHX424

## 2016-03-13 LAB — GLUCOSE, CAPILLARY
GLUCOSE-CAPILLARY: 139 mg/dL — AB (ref 65–99)
Glucose-Capillary: 154 mg/dL — ABNORMAL HIGH (ref 65–99)

## 2016-03-13 SURGERY — REPAIR, HERNIA, VENTRAL
Anesthesia: General | Site: Abdomen

## 2016-03-13 MED ORDER — ROCURONIUM BROMIDE 100 MG/10ML IV SOLN
INTRAVENOUS | Status: DC | PRN
  Administered 2016-03-13: 30 mg via INTRAVENOUS

## 2016-03-13 MED ORDER — EPHEDRINE SULFATE 50 MG/ML IJ SOLN
INTRAMUSCULAR | Status: AC
Start: 2016-03-13 — End: 2016-03-13
  Filled 2016-03-13: qty 1

## 2016-03-13 MED ORDER — LIDOCAINE HCL (PF) 1 % IJ SOLN
INTRAMUSCULAR | Status: AC
  Filled 2016-03-13: qty 5

## 2016-03-13 MED ORDER — GLYCOPYRROLATE 0.2 MG/ML IJ SOLN
INTRAMUSCULAR | Status: DC | PRN
  Administered 2016-03-13: 0.6 mg via INTRAVENOUS

## 2016-03-13 MED ORDER — CHLORHEXIDINE GLUCONATE CLOTH 2 % EX PADS: 6.0000 | MEDICATED_PAD | Freq: Once | CUTANEOUS | Status: DC

## 2016-03-13 MED ORDER — NEOSTIGMINE METHYLSULFATE 10 MG/10ML IV SOLN
INTRAVENOUS | Status: AC
  Filled 2016-03-13: qty 1

## 2016-03-13 MED ORDER — ONDANSETRON HCL 4 MG/2ML IJ SOLN
INTRAMUSCULAR | Status: AC
  Filled 2016-03-13: qty 2

## 2016-03-13 MED ORDER — KETOROLAC TROMETHAMINE 30 MG/ML IJ SOLN
30.0000 mg | Freq: Once | INTRAMUSCULAR | Status: AC
  Administered 2016-03-13: 30 mg via INTRAVENOUS
  Filled 2016-03-13: qty 1

## 2016-03-13 MED ORDER — MIDAZOLAM HCL 2 MG/2ML IJ SOLN
1.0000 mg | INTRAMUSCULAR | Status: DC | PRN
  Administered 2016-03-13 (×2): 2 mg via INTRAVENOUS
  Filled 2016-03-13: qty 2

## 2016-03-13 MED ORDER — FENTANYL CITRATE (PF) 100 MCG/2ML IJ SOLN
25.0000 ug | INTRAMUSCULAR | Status: DC | PRN
  Administered 2016-03-13 (×2): 25 ug via INTRAVENOUS
  Filled 2016-03-13 (×2): qty 2

## 2016-03-13 MED ORDER — 0.9 % SODIUM CHLORIDE (POUR BTL) OPTIME
TOPICAL | Status: DC | PRN
  Administered 2016-03-13: 1000 mL

## 2016-03-13 MED ORDER — DEXAMETHASONE SODIUM PHOSPHATE 4 MG/ML IJ SOLN
4.0000 mg | Freq: Once | INTRAMUSCULAR | Status: AC
  Administered 2016-03-13: 4 mg via INTRAVENOUS

## 2016-03-13 MED ORDER — ROCURONIUM BROMIDE 50 MG/5ML IV SOLN
INTRAVENOUS | Status: AC
  Filled 2016-03-13: qty 1

## 2016-03-13 MED ORDER — HYDROCODONE-ACETAMINOPHEN 5-325 MG PO TABS: 1.0000 | ORAL_TABLET | ORAL | 0 refills | Status: DC | PRN

## 2016-03-13 MED ORDER — MIDAZOLAM HCL 2 MG/2ML IJ SOLN
INTRAMUSCULAR | Status: AC
  Filled 2016-03-13: qty 2

## 2016-03-13 MED ORDER — PHENYLEPHRINE 40 MCG/ML (10ML) SYRINGE FOR IV PUSH (FOR BLOOD PRESSURE SUPPORT)
PREFILLED_SYRINGE | INTRAVENOUS | Status: AC
  Filled 2016-03-13: qty 10

## 2016-03-13 MED ORDER — DEXAMETHASONE SODIUM PHOSPHATE 4 MG/ML IJ SOLN
INTRAMUSCULAR | Status: AC
  Filled 2016-03-13: qty 1

## 2016-03-13 MED ORDER — ONDANSETRON HCL 4 MG/2ML IJ SOLN: 4.0000 mg | Freq: Once | INTRAMUSCULAR | Status: DC | PRN

## 2016-03-13 MED ORDER — PROPOFOL 10 MG/ML IV BOLUS
INTRAVENOUS | Status: AC
  Filled 2016-03-13: qty 20

## 2016-03-13 MED ORDER — BUPIVACAINE HCL (PF) 0.5 % IJ SOLN
INTRAMUSCULAR | Status: AC
  Filled 2016-03-13: qty 30

## 2016-03-13 MED ORDER — SCOPOLAMINE 1 MG/3DAYS TD PT72
MEDICATED_PATCH | TRANSDERMAL | Status: AC
  Filled 2016-03-13: qty 1

## 2016-03-13 MED ORDER — GLYCOPYRROLATE 0.2 MG/ML IJ SOLN
INTRAMUSCULAR | Status: AC
  Filled 2016-03-13: qty 3

## 2016-03-13 MED ORDER — BUPIVACAINE LIPOSOME 1.3 % IJ SUSP
INTRAMUSCULAR | Status: DC | PRN
  Administered 2016-03-13: 20 mL

## 2016-03-13 MED ORDER — MIDAZOLAM HCL 5 MG/5ML IJ SOLN
INTRAMUSCULAR | Status: DC | PRN
  Administered 2016-03-13: 2 mg via INTRAVENOUS

## 2016-03-13 MED ORDER — SCOPOLAMINE 1 MG/3DAYS TD PT72
1.0000 | MEDICATED_PATCH | TRANSDERMAL | Status: DC
  Administered 2016-03-13: 1.5 mg via TRANSDERMAL

## 2016-03-13 MED ORDER — CEFAZOLIN SODIUM-DEXTROSE 2-4 GM/100ML-% IV SOLN
2.0000 g | INTRAVENOUS | Status: AC
  Administered 2016-03-13: 2 g via INTRAVENOUS
  Filled 2016-03-13: qty 100

## 2016-03-13 MED ORDER — SUCCINYLCHOLINE CHLORIDE 20 MG/ML IJ SOLN
INTRAMUSCULAR | Status: AC
  Filled 2016-03-13: qty 1

## 2016-03-13 MED ORDER — NEOSTIGMINE METHYLSULFATE 10 MG/10ML IV SOLN
INTRAVENOUS | Status: DC | PRN
  Administered 2016-03-13: 4 mg via INTRAVENOUS

## 2016-03-13 MED ORDER — BUPIVACAINE LIPOSOME 1.3 % IJ SUSP
INTRAMUSCULAR | Status: AC
  Filled 2016-03-13: qty 20

## 2016-03-13 MED ORDER — ONDANSETRON HCL 4 MG/2ML IJ SOLN
4.0000 mg | Freq: Once | INTRAMUSCULAR | Status: AC
  Administered 2016-03-13: 4 mg via INTRAVENOUS

## 2016-03-13 MED ORDER — FENTANYL CITRATE (PF) 250 MCG/5ML IJ SOLN
INTRAMUSCULAR | Status: AC
  Filled 2016-03-13: qty 5

## 2016-03-13 MED ORDER — POVIDONE-IODINE 10 % OINT PACKET
TOPICAL_OINTMENT | CUTANEOUS | Status: DC | PRN
  Administered 2016-03-13: 1 via TOPICAL

## 2016-03-13 MED ORDER — LACTATED RINGERS IV SOLN
INTRAVENOUS | Status: DC
  Administered 2016-03-13 (×2): via INTRAVENOUS

## 2016-03-13 MED ORDER — LIDOCAINE HCL (CARDIAC) 10 MG/ML IV SOLN
INTRAVENOUS | Status: DC | PRN
  Administered 2016-03-13: 50 mg via INTRAVENOUS

## 2016-03-13 MED ORDER — SODIUM CHLORIDE 0.9 % IJ SOLN
INTRAMUSCULAR | Status: AC
  Filled 2016-03-13: qty 10

## 2016-03-13 MED ORDER — POVIDONE-IODINE 10 % EX OINT
TOPICAL_OINTMENT | CUTANEOUS | Status: AC
  Filled 2016-03-13: qty 1

## 2016-03-13 MED ORDER — PROPOFOL 10 MG/ML IV BOLUS
INTRAVENOUS | Status: DC | PRN
  Administered 2016-03-13: 50 mg via INTRAVENOUS
  Administered 2016-03-13: 150 mg via INTRAVENOUS

## 2016-03-13 MED ORDER — FENTANYL CITRATE (PF) 100 MCG/2ML IJ SOLN
INTRAMUSCULAR | Status: DC | PRN
  Administered 2016-03-13 (×5): 50 ug via INTRAVENOUS

## 2016-03-13 SURGICAL SUPPLY — 39 items
BAG HAMPER (MISCELLANEOUS) ×3 IMPLANT
CHLORAPREP W/TINT 26ML (MISCELLANEOUS) ×3 IMPLANT
CLOTH BEACON ORANGE TIMEOUT ST (SAFETY) ×3 IMPLANT
COVER LIGHT HANDLE STERIS (MISCELLANEOUS) ×6 IMPLANT
ELECT REM PT RETURN 9FT ADLT (ELECTROSURGICAL) ×3
ELECTRODE REM PT RTRN 9FT ADLT (ELECTROSURGICAL) ×1 IMPLANT
FORMALIN 10 PREFIL 480ML (MISCELLANEOUS) ×3 IMPLANT
GAUZE SPONGE 4X4 12PLY STRL (GAUZE/BANDAGES/DRESSINGS) ×3 IMPLANT
GLOVE BIO SURGEON STRL SZ7 (GLOVE) ×3 IMPLANT
GLOVE BIOGEL PI IND STRL 7.0 (GLOVE) ×1 IMPLANT
GLOVE BIOGEL PI INDICATOR 7.0 (GLOVE) ×2
GLOVE EXAM NITRILE MD LF STRL (GLOVE) ×3 IMPLANT
GLOVE SURG SS PI 7.5 STRL IVOR (GLOVE) ×3 IMPLANT
GOWN STRL REUS W/TWL LRG LVL3 (GOWN DISPOSABLE) ×6 IMPLANT
INST SET MAJOR GENERAL (KITS) ×3 IMPLANT
KIT ROOM TURNOVER APOR (KITS) ×3 IMPLANT
MANIFOLD NEPTUNE II (INSTRUMENTS) ×3 IMPLANT
MESH VENTRALEX ST 8CM LRG (Mesh General) ×3 IMPLANT
NEEDLE HYPO 21X1.5 SAFETY (NEEDLE) ×3 IMPLANT
NS IRRIG 1000ML POUR BTL (IV SOLUTION) ×3 IMPLANT
PACK ABDOMINAL MAJOR (CUSTOM PROCEDURE TRAY) ×3 IMPLANT
PAD ARMBOARD 7.5X6 YLW CONV (MISCELLANEOUS) ×3 IMPLANT
SET BASIN LINEN APH (SET/KITS/TRAYS/PACK) ×3 IMPLANT
SPONGE GAUZE 4X4 12PLY STER LF (GAUZE/BANDAGES/DRESSINGS) ×3 IMPLANT
STAPLER VISISTAT (STAPLE) ×3 IMPLANT
SUT ETHIBOND NAB MO 7 #0 18IN (SUTURE) ×3 IMPLANT
SUT NOVA NAB GS-21 1 T12 (SUTURE) IMPLANT
SUT NOVA NAB GS-22 2 2-0 T-19 (SUTURE) ×3 IMPLANT
SUT NOVA NAB GS-26 0 60 (SUTURE) IMPLANT
SUT SILK 2 0 (SUTURE)
SUT SILK 2-0 18XBRD TIE 12 (SUTURE) IMPLANT
SUT VIC AB 0 CT2 8-18 (SUTURE) ×3 IMPLANT
SUT VIC AB 2-0 CT2 27 (SUTURE) ×3 IMPLANT
SUT VIC AB 3-0 SH 27 (SUTURE) ×3
SUT VIC AB 3-0 SH 27X BRD (SUTURE) ×1 IMPLANT
SUT VIC AB 4-0 PS2 27 (SUTURE) IMPLANT
SUT VICRYL AB 2 0 TIES (SUTURE) ×3 IMPLANT
SYR 20CC LL (SYRINGE) ×6 IMPLANT
TAPE CLOTH SURG 4X10 WHT LF (GAUZE/BANDAGES/DRESSINGS) ×3 IMPLANT

## 2016-03-13 NOTE — Op Note (Signed)
Patient:  Nancy [name]  DOB:  [birth year]  MRN:  [id number]   Preop Diagnosis:  Ventral hernia  Postop Diagnosis:  Incisional hernia  Procedure:  Incisional herniorrhaphy with mesh  Surgeon:  Aviva Signs, M.D.  Anes:  Gen. endotracheal  Indications:  Patient is a 50 year old white female who presents with a left lower quadrant ventral hernia. 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 The risks and benefits of the procedure including bleeding, infection, mesh use, and the possibility of recurrence of the hernia were fully explained to the patient, who gave informed consent.  Procedure note:  The patient was placed the supine position. After induction of general endotracheal anesthesia, the abdomen was prepped and draped using the usual sterile technique with DuraPrep. Surgical site confirmation was performed.  An oblique incision was made in the left lower quadrant region over the hernia, just superior and medial to the pelvic brim. The dissection was taken down to the anterior abdominal wall. The hernia was coming through the external oblique aponeuroses fascia. This was divided along its fibers. The hernia sac was entered into and omentum was present in adhesed to the wall. The hernia sac was excised and the omental contents reduced. The ultimate size of the defect was approximately 4 cm in its greatest diameter. It was ovoid in nature. On palpation of the abdominal wall, sutures were palpable along the inferior margin. I suspect the patient may have had an incisional hernia from her previous colostomy placement. An 8 cm Bard ventralax ST patch was inserted and secured to the fascia using 0 Ethibond interrupted sutures. The overlying internal oblique aponeuroses along the somewhat borderline was reapproximated using 0 Ethibond interrupted sutures. The overlying external oblique aponeuroses was  reapproximated using 0 Vicryl interrupted sutures. Subcutaneous layer was reapproximated using 3-0 Vicryl interrupted sutures. Exparel was instilled in the surrounding wound. The skin was closed using staples. Betadine ointment and dry sterile dressings were applied.  All tape and needle counts were correct at the end of the procedure. The patient was extubated in the operating room and transferred to PACU in stable condition.  Complications:  None  EBL:  Minimal  Specimen:  None

## 2016-03-13 NOTE — Interval H&P Note (Signed)
History and Physical Interval Note:  03/13/2016 8:24 AM  Nancy [name]  has presented today for surgery, with the diagnosis of spigelian hernia  The various methods of treatment have been discussed with the patient and family. After consideration of risks, benefits and other options for treatment, the patient has consented to  Procedure(s): HERNIA REPAIR VENTRAL ADULT WITH MESH (N/A) as a surgical intervention .  The patient's history has been reviewed, patient examined, no change in status, stable for surgery.  I have reviewed the patient's chart and labs.  Questions were answered to the patient's satisfaction.     Aviva Signs A

## 2016-03-13 NOTE — Anesthesia Postprocedure Evaluation (Signed)
Anesthesia Post Note  Patient: Nancy [name]  Procedure(s) Performed: Procedure(s): [place] WITH MESH  Patient location during evaluation: PACU Anesthesia Type: General Level of consciousness: awake and alert and oriented Pain management: pain level controlled Vital Signs Assessment: post-procedure vital signs reviewed and stable Respiratory status: spontaneous breathing Cardiovascular status: stable Anesthetic complications: no    Last Vitals:  Vitals:   03/13/16 0752 03/13/16 1018  BP: 125/83 124/78  Pulse: 70 67  Resp: 16 18  Temp: 36.9 C 36.8 C    Last Pain:  Vitals:   03/13/16 0752  TempSrc: Oral  PainSc: 4                  [name] A

## 2016-03-13 NOTE — Transfer of Care (Signed)
Immediate Anesthesia Transfer of Care Note  Patient: Nancy [name]  Procedure(s) Performed: Procedure(s): [place] WITH MESH  Patient Location: PACU  Anesthesia Type:General  Level of Consciousness: awake, oriented and patient cooperative  Airway & Oxygen Therapy: Patient Spontanous Breathing and Patient connected to face mask oxygen  Post-op Assessment: Report given to RN and Post -op Vital signs reviewed and stable  Post vital signs: Reviewed and stable  Last Vitals:  Vitals:   03/13/16 0752  BP: 125/83  Pulse: 70  Resp: 16  Temp: 36.9 C    Last Pain:  Vitals:   03/13/16 0752  TempSrc: Oral  PainSc: 4       Patients Stated Pain Goal: 5 ([id number])  Complications: No apparent anesthesia complications

## 2016-03-13 NOTE — Anesthesia Preprocedure Evaluation (Signed)
Anesthesia Evaluation  Patient identified by MRN, date of birth, ID band Patient awake    Reviewed: Allergy & Precautions, NPO status , Patient's Chart, lab work & pertinent test results  History of Anesthesia Complications (+) PONV and history of anesthetic complications  Airway Mallampati: II  TM Distance: >3 FB     Dental  (+) Teeth Intact, Dental Advisory Given   Pulmonary neg pulmonary ROS,    breath sounds clear to auscultation       Cardiovascular negative cardio ROS   Rhythm:Regular Rate:Normal     Neuro/Psych    GI/Hepatic negative GI ROS,   Endo/Other  diabetes, Well Controlled, Type 2, Insulin DependentHypothyroidism   Renal/GU      Musculoskeletal   Abdominal   Peds  Hematology   Anesthesia Other Findings   Reproductive/Obstetrics                             Anesthesia Physical Anesthesia Plan  ASA: II  Anesthesia Plan: General   Post-op Pain Management:    Induction: Intravenous  Airway Management Planned: Oral ETT  Additional Equipment:   Intra-op Plan:   Post-operative Plan: Extubation in OR  Informed Consent: I have reviewed the patients History and Physical, chart, labs and discussed the procedure including the risks, benefits and alternatives for the proposed anesthesia with the patient or authorized representative who has indicated his/her understanding and acceptance.     Plan Discussed with:   Anesthesia Plan Comments:         Anesthesia Quick Evaluation

## 2016-03-13 NOTE — Anesthesia Procedure Notes (Signed)
Procedure Name: Intubation Performed by: Shunsuke Granzow A Pre-anesthesia Checklist: Patient identified, Patient being monitored, Timeout performed, Emergency Drugs available and Suction available Patient Re-evaluated:Patient Re-evaluated prior to inductionOxygen Delivery Method: Circle system utilized Preoxygenation: Pre-oxygenation with 100% oxygen Intubation Type: IV induction Ventilation: Mask ventilation without difficulty Laryngoscope Size: 3 and Miller Grade View: Grade I Tube type: Oral Tube size: 7.0 mm Number of attempts: 1 Airway Equipment and Method: Stylet Placement Confirmation: ETT inserted through vocal cords under direct vision,  positive ETCO2 and breath sounds checked- equal and bilateral Secured at: 21 cm Tube secured with: Tape Dental Injury: Teeth and Oropharynx as per pre-operative assessment

## 2016-03-13 NOTE — Discharge Instructions (Signed)
Open Hernia Repair, Care After Refer to this sheet in the next few weeks. These instructions provide you with information on caring for yourself after your procedure. Your health care provider may also give you more specific instructions. Your treatment has been planned according to current medical practices, but problems sometimes occur. Call your health care provider if you have any problems or questions after your procedure. WHAT TO EXPECT AFTER THE PROCEDURE After your procedure, it is typical to have the following:  Pain in your abdomen, especially along your incision. You will be given pain medicines to control the pain.  Constipation. You may be given a stool softener to help prevent this. HOME CARE INSTRUCTIONS  Only take over-the-counter or prescription medicines as directed by your health care provider.  Keep the incision area dry and clean. You may wash the incision area gently with soap and water 48 hours after surgery. Gently blot or dab the incision area dry. Do not take baths, use swimming pools, or use hot tubs for 10 days or until your health care provider approves.  Change bandages (dressings) as directed by your health care provider.  Continue your normal diet as directed by your health care provider. Eat plenty of fruits and vegetables to help prevent constipation.  Drink enough fluids to keep your urine clear or pale yellow. This also helps prevent constipation.  Do not drive until your health care provider says it is okay.  Do not lift anything heavier than 10 lb (4.5 kg) or play contact sports for 4 weeks or until your health care provider approves.  Follow up with your health care provider as directed. Ask your health care provider when to make an appointment to have your stitches (sutures) or staples removed. SEEK MEDICAL CARE IF:  You have increased bleeding coming from the incision site.  You have blood in your stool.  You have increasing pain in the incision  area.  You see redness or swelling in the incision area.  You have fluid (pus) coming from the incision.  You have a fever.  You notice a bad smell coming from the incision area or dressing. SEEK IMMEDIATE MEDICAL CARE IF:  You develop a rash.  You have chest pain or shortness of breath.  You feel lightheaded or feel faint.   This information is not intended to replace advice given to you by your health care provider. Make sure you discuss any questions you have with your health care provider.   Document Released: 02/24/2005 Document Revised: 08/28/2014 Document Reviewed: 03/19/2013 Elsevier Interactive Patient Education 2016 [employer] Ventral Hernia A ventral hernia (also called an incisional hernia) is a hernia that occurs at the site of a previous surgical cut (incision) in the abdomen. The abdominal wall spans from your lower chest down to your pelvis. If the abdominal wall is weakened from a surgical incision, a hernia can occur. A hernia is a bulge of bowel or muscle tissue pushing out on the weakened part of the abdominal wall. Ventral hernias can get bigger from straining or lifting. Obese and older people are at higher risk for a ventral hernia. People who develop infections after surgery or require repeat incisions at the same site on the abdomen are also at increased risk. CAUSES  A ventral hernia occurs because of weakness in the abdominal wall at an incision site.  SYMPTOMS  Common symptoms include:  A visible bulge or lump on the abdominal wall.  Pain or tenderness around the lump.  Increased discomfort if you cough or make a sudden movement. If the hernia has blocked part of the intestine, a serious complication can occur (incarcerated or strangulated hernia). This can become a problem that requires emergency surgery because the blood flow to the blocked intestine may be cut off. Symptoms may include:  Feeling sick to your stomach (nauseous).  Throwing up  (vomiting).  Stomach swelling (distention) or bloating.  Fever.  Rapid heartbeat. DIAGNOSIS  Your health care provider will take a medical history and perform a physical exam. Various tests may be ordered, such as:  Blood tests.  Urine tests.  Ultrasonography.  X-rays.  Computed tomography (CT). TREATMENT  Watchful waiting may be all that is needed for a smaller hernia that does not cause symptoms. Your health care provider may recommend the use of a supportive belt (truss) that helps to keep the abdominal wall intact. For larger hernias or those that cause pain, surgery to repair the hernia is usually recommended. If a hernia becomes strangulated, emergency surgery needs to be done right away. HOME CARE INSTRUCTIONS  Avoid putting pressure or strain on the abdominal area.  Avoid heavy lifting.  Use good body positioning for physical tasks. Ask your health care provider about proper body positioning.  Use a supportive belt as directed by your health care provider.  Maintain a healthy weight.  Eat foods that are high in fiber, such as whole grains, fruits, and vegetables. Fiber helps prevent difficult bowel movements (constipation).  Drink enough fluids to keep your urine clear or pale yellow.  Follow up with your health care provider as directed. SEEK MEDICAL CARE IF:   Your hernia seems to be getting larger or more painful. SEEK IMMEDIATE MEDICAL CARE IF:   You have abdominal pain that is sudden and sharp.  Your pain becomes severe.  You have repeated vomiting.  You are sweating a lot.  You notice a rapid heartbeat.  You develop a fever. MAKE SURE YOU:   Understand these instructions.  Will watch your condition.  Will get help right away if you are not doing well or get worse.   This information is not intended to replace advice given to you by your health care provider. Make sure you discuss any questions you have with your health care provider.     Document Released: 07/24/2012 Document Revised: 08/28/2014 Document Reviewed: 07/24/2012 Elsevier Interactive Patient Education [employer].

## 2016-03-17 ENCOUNTER — Encounter (HOSPITAL_COMMUNITY): Payer: Self-pay | Admitting: General Surgery

## 2016-04-04 DIAGNOSIS — E1165 Type 2 diabetes mellitus with hyperglycemia: Secondary | ICD-10-CM | POA: Diagnosis not present

## 2016-04-04 DIAGNOSIS — E038 Other specified hypothyroidism: Secondary | ICD-10-CM | POA: Diagnosis not present

## 2016-04-04 DIAGNOSIS — E782 Mixed hyperlipidemia: Secondary | ICD-10-CM | POA: Diagnosis not present

## 2016-04-04 DIAGNOSIS — Z79899 Other long term (current) drug therapy: Secondary | ICD-10-CM | POA: Diagnosis not present

## 2016-04-07 DIAGNOSIS — E038 Other specified hypothyroidism: Secondary | ICD-10-CM | POA: Diagnosis not present

## 2016-04-07 DIAGNOSIS — Z79899 Other long term (current) drug therapy: Secondary | ICD-10-CM | POA: Diagnosis not present

## 2016-04-07 DIAGNOSIS — E559 Vitamin D deficiency, unspecified: Secondary | ICD-10-CM | POA: Diagnosis not present

## 2016-04-07 DIAGNOSIS — E282 Polycystic ovarian syndrome: Secondary | ICD-10-CM | POA: Diagnosis not present

## 2016-04-07 DIAGNOSIS — E1165 Type 2 diabetes mellitus with hyperglycemia: Secondary | ICD-10-CM | POA: Diagnosis not present

## 2016-04-07 DIAGNOSIS — E1122 Type 2 diabetes mellitus with diabetic chronic kidney disease: Secondary | ICD-10-CM | POA: Diagnosis not present

## 2016-04-07 DIAGNOSIS — E782 Mixed hyperlipidemia: Secondary | ICD-10-CM | POA: Diagnosis not present

## 2016-06-08 DIAGNOSIS — E785 Hyperlipidemia, unspecified: Secondary | ICD-10-CM | POA: Diagnosis not present

## 2016-06-08 DIAGNOSIS — E039 Hypothyroidism, unspecified: Secondary | ICD-10-CM | POA: Diagnosis not present

## 2016-06-08 DIAGNOSIS — Z23 Encounter for immunization: Secondary | ICD-10-CM | POA: Diagnosis not present

## 2016-06-08 DIAGNOSIS — E1149 Type 2 diabetes mellitus with other diabetic neurological complication: Secondary | ICD-10-CM | POA: Diagnosis not present

## 2016-07-07 DIAGNOSIS — E1122 Type 2 diabetes mellitus with diabetic chronic kidney disease: Secondary | ICD-10-CM | POA: Diagnosis not present

## 2016-07-07 DIAGNOSIS — E1165 Type 2 diabetes mellitus with hyperglycemia: Secondary | ICD-10-CM | POA: Diagnosis not present

## 2016-07-07 DIAGNOSIS — E782 Mixed hyperlipidemia: Secondary | ICD-10-CM | POA: Diagnosis not present

## 2016-07-07 DIAGNOSIS — E038 Other specified hypothyroidism: Secondary | ICD-10-CM | POA: Diagnosis not present

## 2016-07-11 DIAGNOSIS — E782 Mixed hyperlipidemia: Secondary | ICD-10-CM | POA: Diagnosis not present

## 2016-07-11 DIAGNOSIS — Z79899 Other long term (current) drug therapy: Secondary | ICD-10-CM | POA: Diagnosis not present

## 2016-07-11 DIAGNOSIS — E559 Vitamin D deficiency, unspecified: Secondary | ICD-10-CM | POA: Diagnosis not present

## 2016-07-11 DIAGNOSIS — E1165 Type 2 diabetes mellitus with hyperglycemia: Secondary | ICD-10-CM | POA: Diagnosis not present

## 2016-07-11 DIAGNOSIS — E038 Other specified hypothyroidism: Secondary | ICD-10-CM | POA: Diagnosis not present

## 2016-07-11 DIAGNOSIS — E1122 Type 2 diabetes mellitus with diabetic chronic kidney disease: Secondary | ICD-10-CM | POA: Diagnosis not present

## 2016-07-11 DIAGNOSIS — E282 Polycystic ovarian syndrome: Secondary | ICD-10-CM | POA: Diagnosis not present

## 2016-08-01 DIAGNOSIS — Z1211 Encounter for screening for malignant neoplasm of colon: Secondary | ICD-10-CM | POA: Diagnosis not present

## 2016-08-01 DIAGNOSIS — Z1212 Encounter for screening for malignant neoplasm of rectum: Secondary | ICD-10-CM | POA: Diagnosis not present

## 2016-09-23 IMAGING — MR MR LUMBAR SPINE WO/W CM
4 of 7 series · 13 of 48 positions shown · IV contrast (multihance)
Comparison: 10/28/2010

CLINICAL DATA: Low back pain radiating to the right hip with lytic
and foot numbness, tingling, and weakness for 2 months.

EXAM:
MRI LUMBAR SPINE WITHOUT AND WITH CONTRAST
TECHNIQUE: Multiplanar and multiecho pulse sequences of the lumbar spine were
obtained without and with intravenous contrast.
CONTRAST:  20mL MULTIHANCE GADOBENATE DIMEGLUMINE 529 MG/ML IV SOLN

[Series 3: T1 · sagittal · 4.0mm · 0.37mm/px · 3 of 13 slices shown (1 of 2)]
[im 1/13]
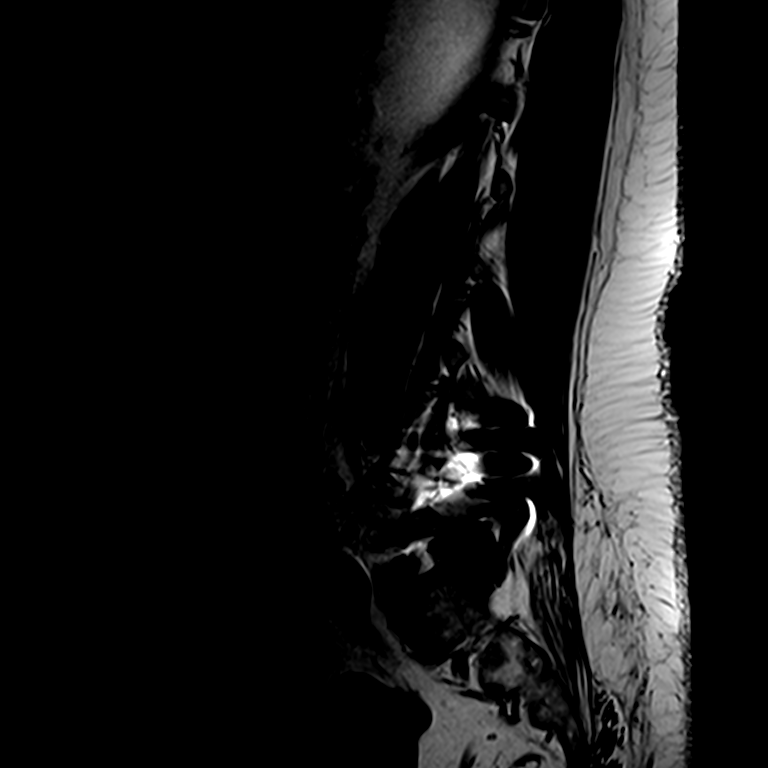
[im 9/13]
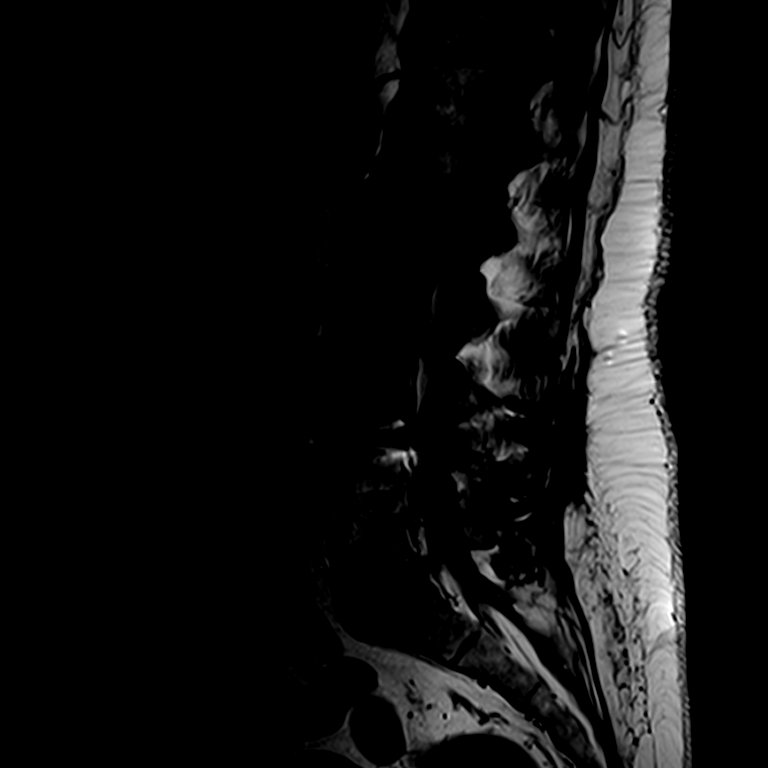
[im 13/13]
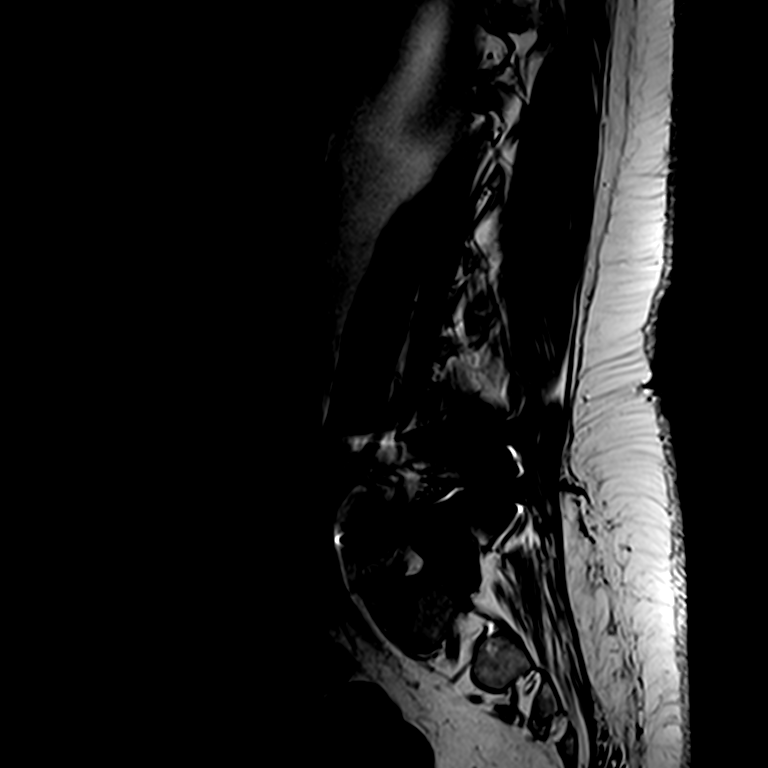

[Series 5: T2 · axial · 4.0mm · 0.21mm/px · z∈[-115,+71]mm · 4 of 44 slices shown]
[im 1/44]
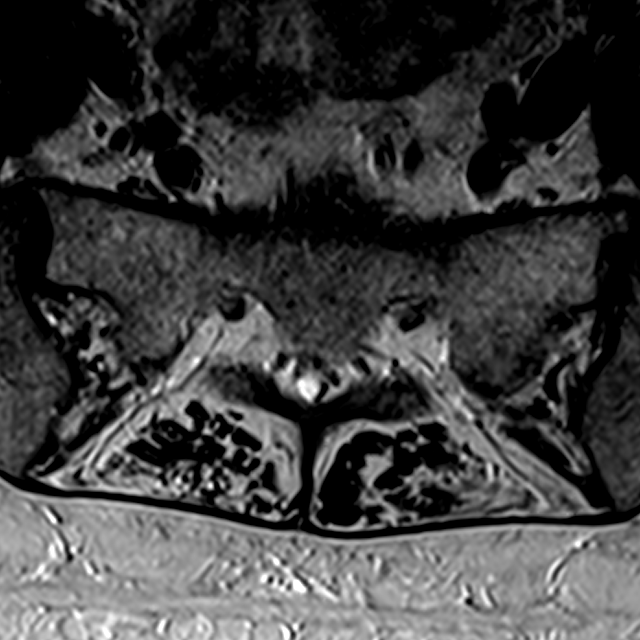
[im 8/44]
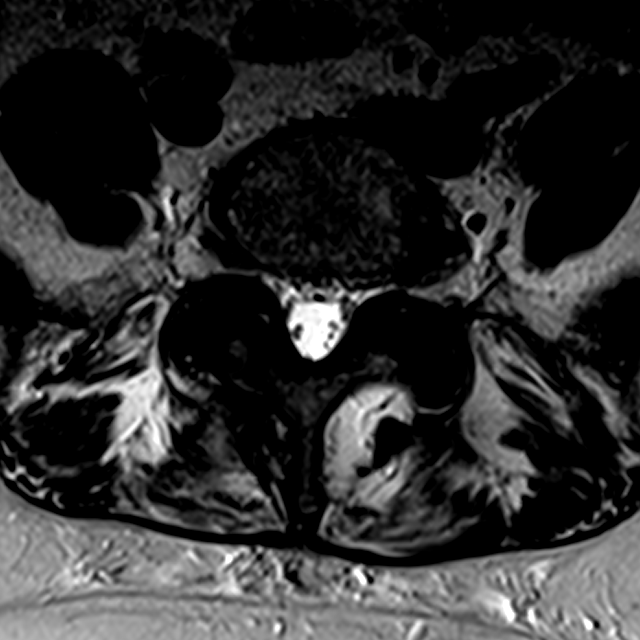
[im 24/44]
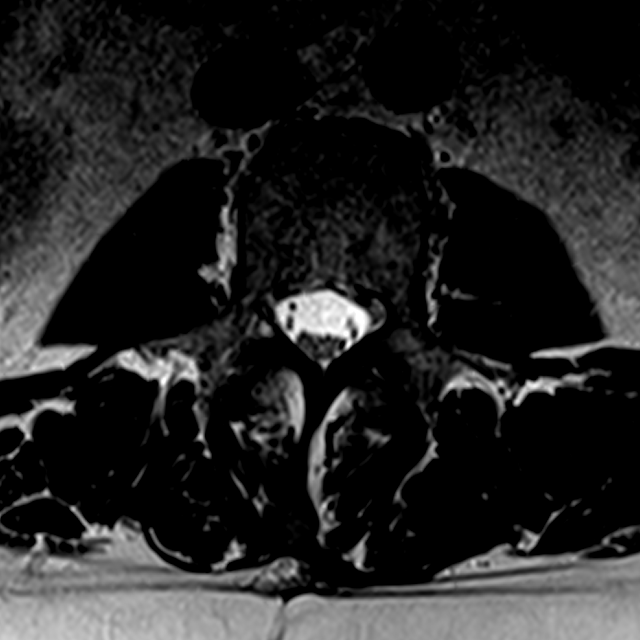
[im 40/44]
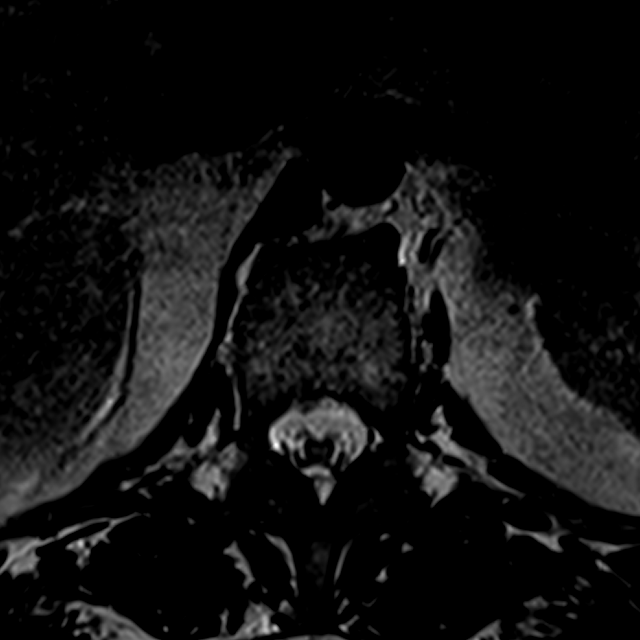

[Series 6: T1 · axial · 4.0mm · 0.20mm/px · z∈[-100,+67]mm · 3 of 44 slices shown (2 of 2)]
[im 5/44]
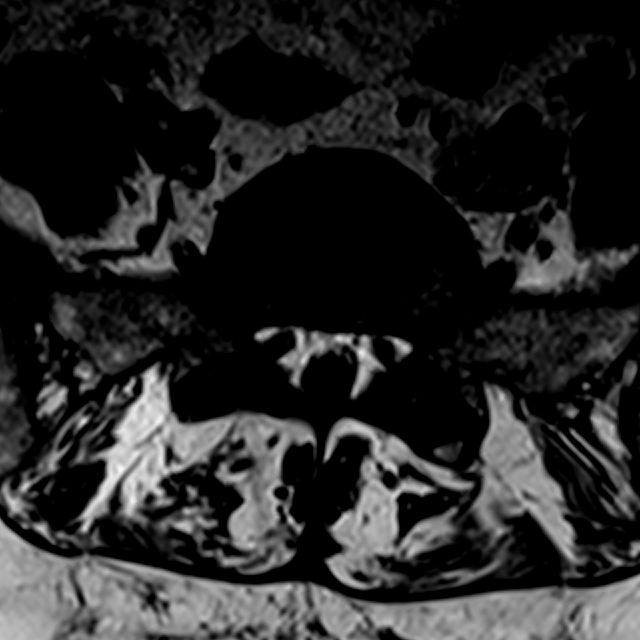
[im 22/44]
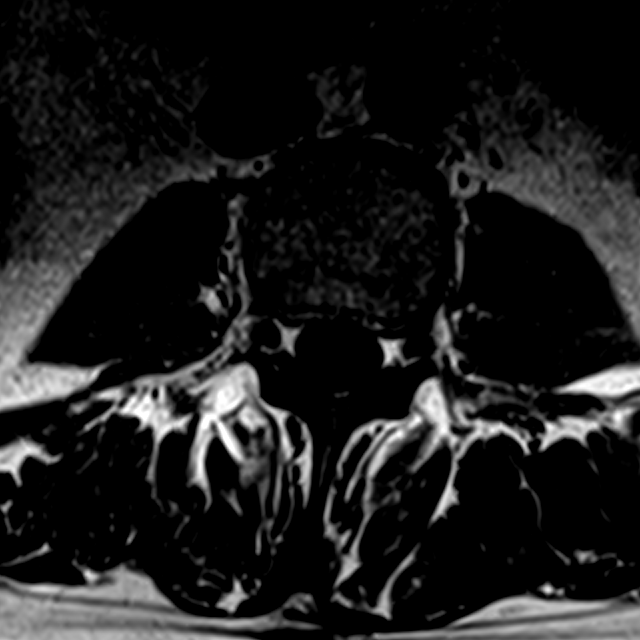
[im 39/44]
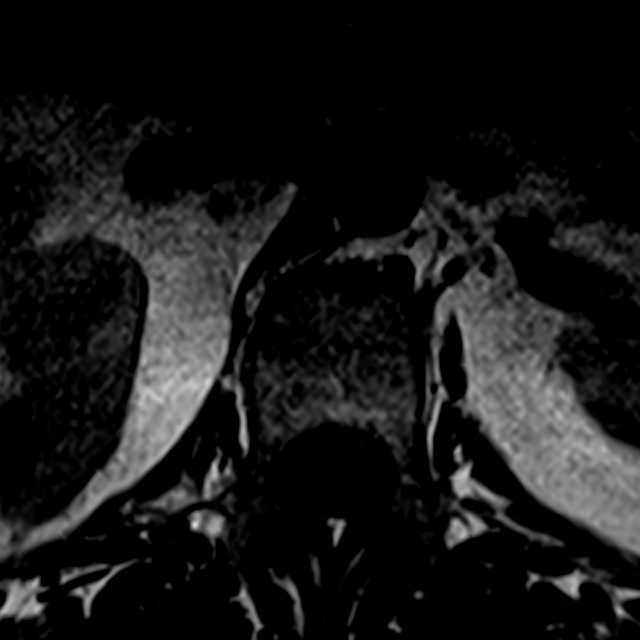

[Series 7: T2 post-contrast · sagittal · 4.0mm · 0.75mm/px · 3 of 13 slices shown]
[im 1/13]
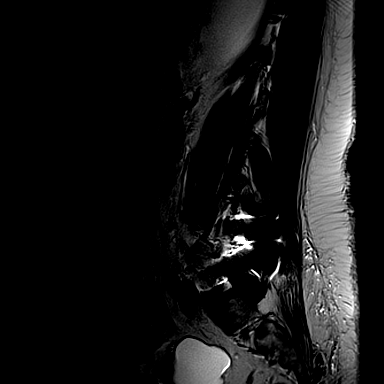
[im 7/13]
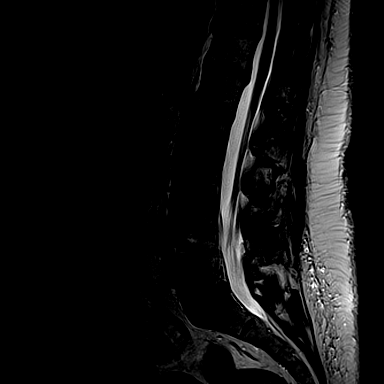
[im 13/13]
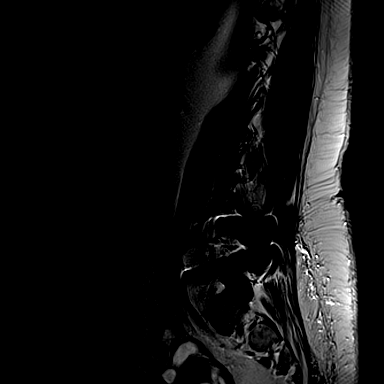

[13 of 48 positions shown; findings below may reference images not displayed]

FINDINGS: L4-5 discectomy with posterior rod and pedicle screw fixation. Bony
fusion is complete.

No marrow signal abnormality suggestive of fracture, infection, or
neoplasm. Normal conus signal and morphology.

There are bilateral adnexal cysts, lobulated and at least 5 cm on
the left, which is increased by 2 cm from abdominal CT 03/31/2013.

No abnormal intrathecal enhancement.  No evidence of arachnoiditis.

Degenerative changes:

T11-12: Disc narrowing and bulging which is stable. No significant
stenosis.

T12- L1: Central disc protrusion with mild inferior migration.
Negative facets. No stenosis or impingement.

L1-L2: Mild facet overgrowth, better seen on the right. No
herniation or stenosis.

L2-L3: No herniation or stenosis.

L3-L4: Facet and ligamentous overgrowth with mild disc bulging.
Stable bilateral subarticular recess stenosis with crowding of the
right L4 nerve root but no definite compression or change. Open
foramen.

L4-L5: Discectomy with complete bony fusion. No evidence of residual
canal or foraminal stenosis.

L5-S1:Bulky facet arthropathy which is minimally progressed given
the long interval. Mild disc bulging, greater to the left foraminal
region with mild foraminal stenosis, stable. No discrete impingement
IMPRESSION: 1. Disc and facet degeneration with no significant progression since
[DATE]. L4-5 discectomy with no residual stenosis.
3. L3-4 stable mild bilateral subarticular recess narrowing.
4. 5 cm left adnexal cyst which is new or enlarged since 0036
abdominal CT. Recommend gynecologic follow-up for pelvic ultrasound
in 6-8 weeks.

## 2016-10-16 DIAGNOSIS — E782 Mixed hyperlipidemia: Secondary | ICD-10-CM | POA: Diagnosis not present

## 2016-10-16 DIAGNOSIS — E039 Hypothyroidism, unspecified: Secondary | ICD-10-CM | POA: Diagnosis not present

## 2016-10-16 DIAGNOSIS — E1022 Type 1 diabetes mellitus with diabetic chronic kidney disease: Secondary | ICD-10-CM | POA: Diagnosis not present

## 2016-10-16 DIAGNOSIS — N189 Chronic kidney disease, unspecified: Secondary | ICD-10-CM | POA: Diagnosis not present

## 2016-10-23 DIAGNOSIS — E1122 Type 2 diabetes mellitus with diabetic chronic kidney disease: Secondary | ICD-10-CM | POA: Diagnosis not present

## 2016-10-23 DIAGNOSIS — E1165 Type 2 diabetes mellitus with hyperglycemia: Secondary | ICD-10-CM | POA: Diagnosis not present

## 2016-10-23 DIAGNOSIS — E282 Polycystic ovarian syndrome: Secondary | ICD-10-CM | POA: Diagnosis not present

## 2016-10-23 DIAGNOSIS — E782 Mixed hyperlipidemia: Secondary | ICD-10-CM | POA: Diagnosis not present

## 2016-10-23 DIAGNOSIS — E559 Vitamin D deficiency, unspecified: Secondary | ICD-10-CM | POA: Diagnosis not present

## 2016-10-23 DIAGNOSIS — E039 Hypothyroidism, unspecified: Secondary | ICD-10-CM | POA: Diagnosis not present

## 2016-10-23 DIAGNOSIS — Z794 Long term (current) use of insulin: Secondary | ICD-10-CM | POA: Diagnosis not present

## 2016-10-23 DIAGNOSIS — N182 Chronic kidney disease, stage 2 (mild): Secondary | ICD-10-CM | POA: Diagnosis not present

## 2016-10-23 DIAGNOSIS — E669 Obesity, unspecified: Secondary | ICD-10-CM | POA: Diagnosis not present

## 2016-10-23 DIAGNOSIS — Z6835 Body mass index (BMI) 35.0-35.9, adult: Secondary | ICD-10-CM | POA: Diagnosis not present

## 2016-10-23 DIAGNOSIS — Z79899 Other long term (current) drug therapy: Secondary | ICD-10-CM | POA: Diagnosis not present

## 2016-10-24 ENCOUNTER — Other Ambulatory Visit: Payer: Self-pay | Admitting: Obstetrics & Gynecology

## 2016-10-24 DIAGNOSIS — Z1231 Encounter for screening mammogram for malignant neoplasm of breast: Secondary | ICD-10-CM

## 2016-11-04 ENCOUNTER — Encounter (HOSPITAL_COMMUNITY): Payer: Self-pay

## 2016-11-04 ENCOUNTER — Emergency Department (HOSPITAL_COMMUNITY): Payer: Medicare Other

## 2016-11-04 ENCOUNTER — Emergency Department (HOSPITAL_COMMUNITY)
Admission: EM | Admit: 2016-11-04 | Discharge: 2016-11-05 | Disposition: A | Discharge status: Home / Self Care | Payer: Medicare Other | Attending: Emergency Medicine | Admitting: Emergency Medicine

## 2016-11-04 DIAGNOSIS — E119 Type 2 diabetes mellitus without complications: Secondary | ICD-10-CM | POA: Diagnosis not present

## 2016-11-04 DIAGNOSIS — N390 Urinary tract infection, site not specified: Secondary | ICD-10-CM | POA: Insufficient documentation

## 2016-11-04 DIAGNOSIS — E039 Hypothyroidism, unspecified: Secondary | ICD-10-CM | POA: Insufficient documentation

## 2016-11-04 DIAGNOSIS — R319 Hematuria, unspecified: Secondary | ICD-10-CM | POA: Diagnosis not present

## 2016-11-04 DIAGNOSIS — R1110 Vomiting, unspecified: Secondary | ICD-10-CM | POA: Diagnosis not present

## 2016-11-04 DIAGNOSIS — Z794 Long term (current) use of insulin: Secondary | ICD-10-CM | POA: Diagnosis not present

## 2016-11-04 DIAGNOSIS — R112 Nausea with vomiting, unspecified: Secondary | ICD-10-CM | POA: Diagnosis not present

## 2016-11-04 DIAGNOSIS — Z79899 Other long term (current) drug therapy: Secondary | ICD-10-CM | POA: Diagnosis not present

## 2016-11-04 DIAGNOSIS — R1032 Left lower quadrant pain: Secondary | ICD-10-CM | POA: Diagnosis not present

## 2016-11-04 DIAGNOSIS — Z7982 Long term (current) use of aspirin: Secondary | ICD-10-CM | POA: Insufficient documentation

## 2016-11-04 LAB — CBC
HCT: 46.8 % — ABNORMAL HIGH (ref 36.0–46.0)
Hemoglobin: 16.1 g/dL — ABNORMAL HIGH (ref 12.0–15.0)
MCH: 31.1 pg (ref 26.0–34.0)
MCHC: 34.4 g/dL (ref 30.0–36.0)
MCV: 90.5 fL (ref 78.0–100.0)
Platelets: 204 10*3/uL (ref 150–400)
RBC: 5.17 MIL/uL — ABNORMAL HIGH (ref 3.87–5.11)
RDW: 13.3 % (ref 11.5–15.5)
WBC: 8.3 10*3/uL (ref 4.0–10.5)

## 2016-11-04 LAB — URINALYSIS, ROUTINE W REFLEX MICROSCOPIC
BILIRUBIN URINE: NEGATIVE
Glucose, UA: >=500 mg/dL — AB
Ketones, ur: 5 mg/dL — AB
NITRITE: NEGATIVE
Protein, ur: NEGATIVE mg/dL
SPECIFIC GRAVITY, URINE: 1.03 (ref 1.005–1.030)
pH: 5 (ref 5.0–8.0)

## 2016-11-04 MED ORDER — FENTANYL CITRATE (PF) 100 MCG/2ML IJ SOLN
50.0000 ug | Freq: Once | INTRAMUSCULAR | Status: AC
  Administered 2016-11-05: 50 ug via INTRAVENOUS
  Filled 2016-11-04: qty 2

## 2016-11-04 MED ORDER — ONDANSETRON HCL 4 MG/2ML IJ SOLN
4.0000 mg | Freq: Once | INTRAMUSCULAR | Status: AC
  Administered 2016-11-05: 4 mg via INTRAVENOUS
  Filled 2016-11-04: qty 2

## 2016-11-04 MED ORDER — IOPAMIDOL (ISOVUE-300) INJECTION 61%
INTRAVENOUS | Status: AC
  Filled 2016-11-04: qty 30

## 2016-11-04 MED ORDER — SODIUM CHLORIDE 0.9 % IV BOLUS (SEPSIS)
1000.0000 mL | Freq: Once | INTRAVENOUS | Status: AC
Start: 2016-11-04 — End: 2016-11-05
  Administered 2016-11-05: 1000 mL via INTRAVENOUS

## 2016-11-04 NOTE — ED Triage Notes (Signed)
Patient states that she is having lower back pain and abdominal pain.  Decrease in appetite, feeling dehydrated, and states that she felt like she was going to pass out yesterday.  Patient denies diarrhea.  Vomited once on Friday and took a nausea medication that she had and has not vomited since.

## 2016-11-04 NOTE — ED Provider Notes (Signed)
[facility] DEPT Provider Note   CSN: [id number] Arrival date & time: 11/04/16  1949   By signing my name below, I, Eunice Blase, attest that this documentation has been prepared under the direction and in the presence of Ezequiel Essex, MD. Electronically signed, Eunice Blase, ED Scribe. 11/04/16. 11:58 PM.   History   Chief Complaint Chief Complaint  Patient presents with  . Abdominal Pain   The history is provided by the patient and medical records. No language interpreter was used.    HPI Comments: Nancy [name] is a 51 y.o. female with Hx of abdominal and back surgeries who presents to the Emergency Department complaining of worsened chronic low back pain. She notes constant abdominal pain as well; she reports associated nausea, vomiting and decreased appetite. She states her pain is at the site of her past surgeries to her back and abdomen, and she notes her back pain is worse than it is chronically. No modifying factors noted to pain. Pt has taken methocarbamol without relief. Pt has had her uterus and gallbladder removed. Hx of hernia reduction procedure and IDDM. Last bowel movement yesterday. Her last back surgery was reportedly >10 years ago without complication. Pt denies constipation, diarrhea, fever, dysuria, hematuria, vaginal discharge or bleeding, Hx of appendectomy, chest pain, leg weakness or numbness, incontinence, SOB and Hx CHF or heart failure   Past Medical History:  Diagnosis Date  . Back pain   . Complication of anesthesia   . Diabetes mellitus   . PONV (postoperative nausea and vomiting)   . Rosacea   . Small bowel obstruction   . Thyroid disease   . Ventral hernia     Patient Active Problem List   Diagnosis Date Noted  . Infected zoster 04/29/2013  . Hematoma 03/27/2013  . Acute postoperative pain of abdomen 12/27/2012  . Postop check 10/01/2012  . Wound seroma 08/30/2012  . Seroma 08/22/2012  . Chronic back pain 08/10/2012  .  Hypothyroidism 08/10/2012  . Ventral hernia with obstruction 08/10/2012  . Rosacea 08/10/2012  . Hypokalemia 08/10/2012  . DM 02/15/2009    Past Surgical History:  Procedure Laterality Date  . BACK SURGERY     x3; fusion with rods and screws  . carpatunnel surgery Bilateral   . CHOLECYSTECTOMY    . COLON SURGERY    . COLOSTOMY    . ENDOMETRIAL ABLATION    . INSERTION OF MESH  08/03/2012   Procedure: INSERTION OF MESH;  Surgeon: Gwenyth Ober, MD;  Location: [facility];  Service: General;  Laterality: N/A;  . LAPAROSCOPIC BILATERAL SALPINGO OOPHERECTOMY Bilateral 05/12/2015   Procedure: LAPAROSCOPIC BILATERAL SALPINGO OOPHORECTOMY;  Surgeon: Florian Buff, MD;  Location: [facility];  Service: Gynecology;  Laterality: Bilateral;  . LAPAROSCOPIC LYSIS OF ADHESIONS Bilateral 05/12/2015   Procedure: EXTENSIVE LAPAROSCOPIC LYSIS OF ADHESIONS;  Surgeon: Florian Buff, MD;  Location: [facility];  Service: Gynecology;  Laterality: Bilateral;  . LAPAROTOMY  08/03/2012   Procedure: EXPLORATORY LAPAROTOMY;  Surgeon: Gwenyth Ober, MD;  Location: [facility];  Service: General;  Laterality: N/A;  with extensive enterolysis  . LIVER BIOPSY    . REVISION COLOSTOMY    . VENTRAL HERNIA REPAIR  08/03/2012   Procedure: HERNIA REPAIR VENTRAL ADULT;  Surgeon: Gwenyth Ober, MD;  Location: [facility];  Service: General;  Laterality: N/A;  . VENTRAL HERNIA REPAIR  03/13/2016   Procedure: INCISIONAL HERNIA REPAIR WITH MESH;  Surgeon: Aviva Signs, MD;  Location: [facility];  Service:  General;;    OB History    No data available       Home Medications    Prior to Admission medications   Medication Sig Start Date End Date Taking? Authorizing Provider  aspirin 81 MG EC tablet Take 81 mg by mouth every evening.     Historical Provider, MD  atorvastatin (LIPITOR) 10 MG tablet Take 10 mg by mouth every other day.     Historical Provider, MD  Canagliflozin (INVOKANA) 300 MG TABS Take 300 mg by mouth every morning. Before breakfast     Historical Provider, MD  Cholecalciferol 5000 UNITS capsule Take 5,000 Units by mouth every morning. Vitamin D    Historical Provider, MD  doxycycline (VIBRA-TABS) 100 MG tablet Take 100 mg by mouth 2 (two) times daily.    Historical Provider, MD  enalapril (VASOTEC) 5 MG tablet Take 5 mg by mouth at bedtime.      Historical Provider, MD  HYDROcodone-acetaminophen (NORCO/VICODIN) 5-325 MG tablet Take 1-2 tablets by mouth every 4 (four) hours as needed for moderate pain. 03/13/16 03/13/17  Aviva Signs, MD  insulin glargine (LANTUS SOLOSTAR) 100 UNIT/ML injection Inject 15-20 Units into the skin 2 (two) times daily.     Historical Provider, MD  levothyroxine (SYNTHROID, LEVOTHROID) 150 MCG tablet Take 150 mcg by mouth daily before breakfast.    Historical Provider, MD  Liraglutide (VICTOZA) 18 MG/3ML SOLN Inject 1.8 mg into the skin at bedtime.     Historical Provider, MD  metFORMIN (GLUCOPHAGE) 1000 MG tablet Take 1 tablet (1,000 mg total) by mouth 2 (two) times daily. 12/11/12   Emina Riebock, NP  methocarbamol (ROBAXIN) 500 MG tablet Take 500 mg by mouth every 6 (six) hours as needed for muscle spasms.    Historical Provider, MD  MetroNIDAZOLE-Cleanser 0.75 % (Cream) KIT Apply 1 application topically 2 (two) times daily. To face for rosacea    Historical Provider, MD  nateglinide (STARLIX) 120 MG tablet Take 120 mg by mouth daily.    Historical Provider, MD    Family History Family History  Problem Relation Age of Onset  . Thyroid disease Mother   . Heart disease Father   . Diabetes Mellitus I Maternal Grandmother   . Diabetes Mellitus I Maternal Grandfather   . Diabetes Mellitus I Paternal Grandmother   . Diabetes Mellitus I Paternal Grandfather     Social History Social History  Substance Use Topics  . Smoking status: Never Smoker  . Smokeless tobacco: Never Used  . Alcohol use No     Allergies   Chocolate; Sulfa antibiotics; and Penicillin g   Review of Systems Review of  Systems  All other systems reviewed and are negative.  A complete 10 system review of systems was obtained and all systems are negative except as noted in the HPI and PMH.    Physical Exam Updated Vital Signs BP (!) 108/91 (BP Location: Left Arm)   Pulse 94   Temp 99.1 F (37.3 C)   Resp 17   Ht '5\' 6"'$  (1.676 m)   Wt 224 lb (101.6 kg)   SpO2 99%   BMI 36.15 kg/m   Physical Exam  Constitutional: She is oriented to person, place, and time. She appears well-developed and well-nourished. No distress.  HENT:  Head: Normocephalic and atraumatic.  Mouth/Throat: Oropharynx is clear and moist. Mucous membranes are dry. No oropharyngeal exudate.  Eyes: Conjunctivae and EOM are normal. Pupils are equal, round, and reactive to light.  Neck: Normal range  of motion. Neck supple.  No meningismus.  Cardiovascular: Normal rate, regular rhythm, normal heart sounds and intact distal pulses.   No murmur heard. Pulmonary/Chest: Effort normal and breath sounds normal. No respiratory distress.  Abdominal: Soft. There is tenderness (lower) in the left lower quadrant. There is guarding. There is no rebound.  Musculoskeletal: Normal range of motion. She exhibits no edema or tenderness.  midline L-spine pain. 5/5 strength in bilateral lower extremities. Ankle plantar and dorsiflexion intact. Great toe extension intact bilaterally. +2 DP and PT pulses. +2 patellar reflexes bilaterally. Normal gait.   Neurological: She is alert and oriented to person, place, and time. No cranial nerve deficit. She exhibits normal muscle tone. Coordination normal.   5/5 strength throughout. CN 2-12 intact.Equal grip strength.   Skin: Skin is warm.  Psychiatric: She has a normal mood and affect. Her behavior is normal.  Nursing note and vitals reviewed.   ED Treatments / Results  DIAGNOSTIC STUDIES: Oxygen Saturation is 99% on RA, normal by my interpretation.    COORDINATION OF CARE: 11:37 PM Discussed treatment plan  with pt at bedside and pt agreed to plan. Will order medications.  Labs (all labs ordered are listed, but only abnormal results are displayed) Labs Reviewed  CBC - Abnormal; Notable for the following:       Result Value   RBC 5.17 (*)    Hemoglobin 16.1 (*)    HCT 46.8 (*)    All other components within normal limits  URINALYSIS, ROUTINE W REFLEX MICROSCOPIC - Abnormal; Notable for the following:    APPearance HAZY (*)    Glucose, UA >=500 (*)    Hgb urine dipstick MODERATE (*)    Ketones, ur 5 (*)    Leukocytes, UA MODERATE (*)    Bacteria, UA RARE (*)    All other components within normal limits  URINE CULTURE  LIPASE, BLOOD  COMPREHENSIVE METABOLIC PANEL  PREGNANCY, URINE    EKG  EKG Interpretation None       Radiology Ct Abdomen Pelvis W Contrast  Result Date: 11/05/2016 CLINICAL DATA:  51 year old female with chronic low back pain. Nausea and vomiting and decreased appetite. EXAM: CT ABDOMEN AND PELVIS WITH CONTRAST TECHNIQUE: Multidetector CT imaging of the abdomen and pelvis was performed using the standard protocol following bolus administration of intravenous contrast. CONTRAST:  7m ISOVUE-300 IOPAMIDOL (ISOVUE-300) INJECTION 61%, 1055mISOVUE-300 IOPAMIDOL (ISOVUE-300) INJECTION 61% COMPARISON:  Abdominal CT dated 09/27/2015 FINDINGS: Lower chest: The visualized lung bases are clear. No intra-abdominal free air or free fluid. Hepatobiliary: Cholecystectomy. Apparent mild fatty infiltration of the liver. Subcentimeter right liver hypodense lesion is too small to characterize. Pancreas: There is an 8 mm hypodense lesion in the head of the pancreas adjacent to the porta splenic confluence (series 2, image 34 and coronal series 5, image 62). This is similar to the prior CT. MRI is recommended for further evaluation. There is no dilatation of the main pancreatic duct or gland atrophy. Spleen: Normal in size without focal abnormality. Adrenals/Urinary Tract: Stable 2 cm left  adrenal nodule, indeterminate, likely an adenoma. There is a 1.5 cm hypodense lesion in the left renal inter pole anteriorly which is indeterminate but likely represents a cyst. There is no hydronephrosis on either side. There is symmetric uptake and excretion of contrast by kidneys bilaterally. The visualized ureters and urinary bladder appear unremarkable. Stomach/Bowel: There is postsurgical changes of partial sigmoid resection with anastomotic suture. There is no evidence of bowel obstruction or active inflammation.  Small scattered colonic diverticula without active inflammatory changes. Normal appendix. Vascular/Lymphatic: The abdominal aorta and IVC appear unremarkable. No portal venous gas identified. There is no adenopathy. Reproductive: The uterus is anteverted. There is prominence of the posterior uterine body likely related to a fibroid. The left ovary is unremarkable. A 3.0 x 2.2 cm hypodense lesion in the region of the right ovary appears new compared prior CT and may represent a right ovarian cyst. Ultrasound is recommended for further evaluation. Other: Midline vertical anterior pelvic wall incisional scar and a small fat containing umbilical hernia noted. There is anterior pelvic C-section scar. A 1.2 x 2.3 cm low attenuating area within the anterior pelvic scar noted. A small fluid collection is not entirely excluded. There is a small fat containing left anterior pelvic wall hernia without associated inflammatory changes. Musculoskeletal: There is degenerative changes of the spine. L4-L5 posterior fusion screws noted. No acute fracture. IMPRESSION: 1. No definite acute intra-abdominopelvic pathology identified. No evidence of bowel obstruction or active inflammation. Normal appendix. 2. **An incidental finding of potential clinical significance has been found. Subcentimeter hypodense lesion in the head of the pancreas, incompletely characterized. Further evaluation with MRI recommended.** 3. Right  ovarian low attenuating lesion, possibly a cyst. Ultrasound is recommended for further characterization. 4. Fatty infiltration of the liver. Electronically Signed   By: Anner Crete M.D.   On: 11/05/2016 02:29    Procedures Procedures (including critical care time)  Medications Ordered in ED Medications  sodium chloride 0.9 % bolus 1,000 mL (not administered)  ondansetron (ZOFRAN) injection 4 mg (not administered)  fentaNYL (SUBLIMAZE) injection 50 mcg (not administered)  iopamidol (ISOVUE-300) 61 % injection (not administered)     Initial Impression / Assessment and Plan / ED Course  I have reviewed the triage vital signs and the nursing notes.  Pertinent labs & imaging results that were available during my care of the patient were reviewed by me and considered in my medical decision making (see chart for details).    Patient presents with several day history of lower back pain and abdominal pain decreased appetite and vomiting once 2 days ago.  Abdomen is soft without peritoneal signs. Urinalysis is positive for infection. Culture is sent. Rocephin given.  Labs are reassuring. CT scan obtained shows no kidney stone. Multiple incidental findings discussed with patient including pancreas lesion and ovarian lesion.  Patient treated for UTI. No vomiting or diarrhea in the ED. Given IV fluids and IV antibiotics. Urine culture sent. Tolerating by mouth.  Follow-up with PCP. Return precautions discussed. Final Clinical Impressions(s) / ED Diagnoses   Final diagnoses:  Urinary tract infection with hematuria, site unspecified  Left lower quadrant pain    New Prescriptions New Prescriptions   No medications on file    I personally performed the services described in this documentation, which was scribed in my presence. The recorded information has been reviewed and is accurate.    Ezequiel Essex, MD 11/05/16 [phone]

## 2016-11-05 DIAGNOSIS — R1110 Vomiting, unspecified: Secondary | ICD-10-CM | POA: Diagnosis not present

## 2016-11-05 DIAGNOSIS — R112 Nausea with vomiting, unspecified: Secondary | ICD-10-CM | POA: Diagnosis not present

## 2016-11-05 LAB — COMPREHENSIVE METABOLIC PANEL
ALT: 35 U/L (ref 14–54)
AST: 26 U/L (ref 15–41)
Albumin: 4.3 g/dL (ref 3.5–5.0)
Alkaline Phosphatase: 59 U/L (ref 38–126)
Anion gap: 13 (ref 5–15)
BUN: 13 mg/dL (ref 6–20)
CO2: 23 mmol/L (ref 22–32)
CREATININE: 0.63 mg/dL (ref 0.44–1.00)
Calcium: 9.3 mg/dL (ref 8.9–10.3)
Chloride: 100 mmol/L — ABNORMAL LOW (ref 101–111)
GFR calc non Af Amer: >60 mL/min (ref >60)
Glucose, Bld: 208 mg/dL — ABNORMAL HIGH (ref 65–99)
Potassium: 3.4 mmol/L — ABNORMAL LOW (ref 3.5–5.1)
SODIUM: 136 mmol/L (ref 135–145)
Total Bilirubin: 1.7 mg/dL — ABNORMAL HIGH (ref 0.3–1.2)
Total Protein: 7.9 g/dL (ref 6.5–8.1)

## 2016-11-05 LAB — PREGNANCY, URINE: Preg Test, Ur: NEGATIVE

## 2016-11-05 LAB — LIPASE, BLOOD: LIPASE: 25 U/L (ref 11–51)

## 2016-11-05 MED ORDER — IOPAMIDOL (ISOVUE-300) INJECTION 61%
100.0000 mL | Freq: Once | INTRAVENOUS | Status: AC | PRN
  Administered 2016-11-05: 100 mL via INTRAVENOUS

## 2016-11-05 MED ORDER — DEXTROSE 5 % IV SOLN
1.0000 g | Freq: Once | INTRAVENOUS | Status: AC
  Administered 2016-11-05: 1 g via INTRAVENOUS
  Filled 2016-11-05: qty 10

## 2016-11-05 MED ORDER — ONDANSETRON HCL 4 MG PO TABS: 4.0000 mg | ORAL_TABLET | Freq: Three times a day (TID) | ORAL | 0 refills | Status: DC | PRN

## 2016-11-05 MED ORDER — IOPAMIDOL (ISOVUE-300) INJECTION 61%
30.0000 mL | Freq: Once | INTRAVENOUS | Status: AC | PRN
  Administered 2016-11-05: 30 mL via ORAL

## 2016-11-05 MED ORDER — CEPHALEXIN 500 MG PO CAPS: 500.0000 mg | ORAL_CAPSULE | Freq: Four times a day (QID) | ORAL | 0 refills | Status: DC

## 2016-11-05 NOTE — ED Notes (Signed)
Patient given Sprite Zero to drink at this time.

## 2016-11-05 NOTE — Discharge Instructions (Signed)
Take the antibiotics as prescribed. Follow up with Dr. Willey Blade.  You should have an ultrasound of your ovary as well as an MRI of your pancreas to investigate the incidental findings seen on CT scan.  Return to the ED if you develop new or worsening symptoms.

## 2016-11-06 ENCOUNTER — Encounter: Payer: Self-pay | Admitting: Obstetrics & Gynecology

## 2016-11-06 ENCOUNTER — Ambulatory Visit (INDEPENDENT_AMBULATORY_CARE_PROVIDER_SITE_OTHER): Payer: Medicare Other | Admitting: Obstetrics & Gynecology

## 2016-11-06 VITALS — BP 126/78 | HR 89 | Wt 219.0 lb

## 2016-11-06 DIAGNOSIS — R1032 Left lower quadrant pain: Secondary | ICD-10-CM

## 2016-11-06 DIAGNOSIS — Z6834 Body mass index (BMI) 34.0-34.9, adult: Secondary | ICD-10-CM | POA: Diagnosis not present

## 2016-11-06 LAB — URINE CULTURE

## 2016-11-06 NOTE — Progress Notes (Signed)
Chief Complaint  Patient presents with  . Follow-up    ED visit; abd pain    Blood pressure 126/78, pulse 89, weight 219 lb (99.3 kg).  51 y.o. No obstetric history on file. No LMP recorded. Patient has had an ablation. The current method of family planning is BSO.  Outpatient Encounter Prescriptions as of 11/06/2016  Medication Sig Note  . aspirin 81 MG EC tablet Take 81 mg by mouth every evening.  03/08/2016: On hold for surgery.  Marland Kitchen atorvastatin (LIPITOR) 10 MG tablet Take 10 mg by mouth every other day.    . Canagliflozin (INVOKANA) 300 MG TABS Take 300 mg by mouth every morning. Before breakfast   . cephALEXin (KEFLEX) 500 MG capsule Take 1 capsule (500 mg total) by mouth 4 (four) times daily.   . Cholecalciferol 5000 UNITS capsule Take 5,000 Units by mouth every morning. Vitamin D   . doxycycline (VIBRA-TABS) 100 MG tablet Take 100 mg by mouth 2 (two) times daily.   . enalapril (VASOTEC) 5 MG tablet Take 5 mg by mouth at bedtime.     . insulin glargine (LANTUS SOLOSTAR) 100 UNIT/ML injection Inject 15-20 Units into the skin 2 (two) times daily.  03/13/2016: Took 14 units at 2130  . levothyroxine (SYNTHROID, LEVOTHROID) 150 MCG tablet Take 150 mcg by mouth daily before breakfast.   . Liraglutide (VICTOZA) 18 MG/3ML SOLN Inject 1.8 mg into the skin at bedtime.    . metFORMIN (GLUCOPHAGE) 1000 MG tablet Take 1 tablet (1,000 mg total) by mouth 2 (two) times daily.   . methocarbamol (ROBAXIN) 500 MG tablet Take 500 mg by mouth every 6 (six) hours as needed for muscle spasms.   . MetroNIDAZOLE-Cleanser 0.75 % (Cream) KIT Apply 1 application topically 2 (two) times daily. To face for rosacea   . nateglinide (STARLIX) 120 MG tablet Take 120 mg by mouth daily.   . ondansetron (ZOFRAN) 4 MG tablet Take 1 tablet (4 mg total) by mouth every 8 (eight) hours as needed for nausea or vomiting.   . [DISCONTINUED] HYDROcodone-acetaminophen (NORCO/VICODIN) 5-325 MG tablet Take 1-2 tablets by  mouth every 4 (four) hours as needed for moderate pain. (Patient not taking: Reported on 11/06/2016)    No facility-administered encounter medications on file as of 11/06/2016.     Subjective Seen by request from Dr Willey Blade as a work in today  Pt had sudden onset of LLQ pain 4 days ago,achy throbby not burning or sharp Went to ED and CT was essentially benign(had a 3x2 cm fluid collection called a right ovarian cyst, pt had BSO 05/12/2015), most likely represents a periotneal fluid collection and her pain is not on the right in any event No fever, nausea diarrhea or constipation No urinary complaints really  Interestingly had a similar episode 1/5-08/27/2016 but not as severe  Objective LLQ incision clean dry +Cornet's sign which points to this being neurogenic abdominal wall  For diagnostic and therapeutic purposes injeted 12 cc of 0.5 % marcaine plain  Trigger Point Injection   Pre-operative diagnosis: Neurogenic pain in abdominal wll in inferior aspect of her abdominal wall incision for ventral hernia repair  Post-operative diagnosis: same  After risks and benefits were explained including bleeding, infection, worsening of the pain, damage to the area being injected, weakness, allergic reaction to medications, vascular injection, and nerve damage, signed consent was obtained.  All questions were answered.    The area of the trigger point was identified and the skin  prepped three times with alcohol and the alcohol allowed to dry.  Next, a 25 gauge 1.5 inch needle was placed in the area of the trigger point.  Once reproduction of the pain was elicited and negative aspiration confirmed, the trigger point was injected and the needle removed.    The patient did tolerate the procedure well and there were not complications.    Medication used:  12 cc o.5% Marcaine.    Trigger points injected: 1    Trigger point(s) location(s):  left   Pain went from an 8 pre injection to 1.5 15 minutes  after injection Pertinent ROS No burning with urination, frequency or urgency No nausea, vomiting or diarrhea Nor fever chills or other constitutional symptoms   Labs or studies CT scan and labs were reviewed    Impression Diagnoses this Encounter::   ICD-9-CM ICD-10-CM   1. Abdominal wall pain in left lower quadrant 789.04 R10.32    Lower edge of the ventral hernia repair inciion, CT reveals no intra abdominal problems with the mesh or recurrent hernia Exam neurogenic abd wall source    Established relevant diagnosis(es):   Plan/Recommendations: No orders of the defined types were placed in this encounter.   Labs or Scans Ordered: No orders of the defined types were placed in this encounter.   Management:: Responded to injection recommend getting OTC lidocaine 4% patches and cutting to size to fit the area of concern  Follow up Return if symptoms worsen or fail to improve.           All questions were answered.  Past Medical History:  Diagnosis Date  . Back pain   . Complication of anesthesia   . Diabetes mellitus   . PONV (postoperative nausea and vomiting)   . Rosacea   . Small bowel obstruction   . Thyroid disease   . Ventral hernia     Past Surgical History:  Procedure Laterality Date  . BACK SURGERY     x3; fusion with rods and screws  . carpatunnel surgery Bilateral   . CHOLECYSTECTOMY    . COLON SURGERY    . COLOSTOMY    . ENDOMETRIAL ABLATION    . INSERTION OF MESH  08/03/2012   Procedure: INSERTION OF MESH;  Surgeon: Gwenyth Ober, MD;  Location: [facility];  Service: General;  Laterality: N/A;  . LAPAROSCOPIC BILATERAL SALPINGO OOPHERECTOMY Bilateral 05/12/2015   Procedure: LAPAROSCOPIC BILATERAL SALPINGO OOPHORECTOMY;  Surgeon: Florian Buff, MD;  Location: [facility];  Service: Gynecology;  Laterality: Bilateral;  . LAPAROSCOPIC LYSIS OF ADHESIONS Bilateral 05/12/2015   Procedure: EXTENSIVE LAPAROSCOPIC LYSIS OF ADHESIONS;  Surgeon: Florian Buff, MD;  Location: [facility];  Service: Gynecology;  Laterality: Bilateral;  . LAPAROTOMY  08/03/2012   Procedure: EXPLORATORY LAPAROTOMY;  Surgeon: Gwenyth Ober, MD;  Location: [facility];  Service: General;  Laterality: N/A;  with extensive enterolysis  . LIVER BIOPSY    . REVISION COLOSTOMY    . VENTRAL HERNIA REPAIR  08/03/2012   Procedure: HERNIA REPAIR VENTRAL ADULT;  Surgeon: Gwenyth Ober, MD;  Location: [facility];  Service: General;  Laterality: N/A;  . VENTRAL HERNIA REPAIR  03/13/2016   Procedure: INCISIONAL HERNIA REPAIR WITH MESH;  Surgeon: Aviva Signs, MD;  Location: [facility];  Service: General;;    OB History    No data available      Allergies  Allergen Reactions  . Chocolate Other (See Comments)    rosacea   .  Sulfa Antibiotics Nausea Only  . Penicillin G Rash    Has patient had a PCN reaction causing immediate rash, facial/tongue/throat swelling, SOB or lightheadedness with hypotension: Yes Has patient had a PCN reaction causing severe rash involving mucus membranes or skin necrosis: No Has patient had a PCN reaction that required hospitalization No Has patient had a PCN reaction occurring within the last 10 years: No If all of the above answers are "NO", then may proceed with Cephalosporin use. Patient states she is not allergic to penicillin    Social History   Social History  . Marital status: Married    Spouse name: N/A  . Number of children: N/A  . Years of education: N/A   Social History Main Topics  . Smoking status: Never Smoker  . Smokeless tobacco: Never Used  . Alcohol use No  . Drug use: No  . Sexual activity: Yes   Other Topics Concern  . None   Social History Narrative  . None    Family History  Problem Relation Age of Onset  . Thyroid disease Mother   . Heart disease Father   . Diabetes Mellitus I Maternal Grandmother   . Diabetes Mellitus I Maternal Grandfather   . Diabetes Mellitus I Paternal Grandmother   . Diabetes Mellitus I  Paternal Grandfather

## 2016-11-06 NOTE — Patient Instructions (Signed)
OTC lidocaine patches, 4%, cut to silver dollar size and place on area 12 hours on 12 hours off

## 2016-12-04 ENCOUNTER — Ambulatory Visit (HOSPITAL_COMMUNITY)
Admission: RE | Admit: 2016-12-04 | Discharge: 2016-12-04 | Disposition: A | Discharge status: Home / Self Care | Payer: Medicare Other | Source: Ambulatory Visit | Attending: Obstetrics & Gynecology | Admitting: Obstetrics & Gynecology

## 2016-12-04 DIAGNOSIS — Z1231 Encounter for screening mammogram for malignant neoplasm of breast: Secondary | ICD-10-CM

## 2016-12-08 DIAGNOSIS — E1149 Type 2 diabetes mellitus with other diabetic neurological complication: Secondary | ICD-10-CM | POA: Diagnosis not present

## 2016-12-08 DIAGNOSIS — K869 Disease of pancreas, unspecified: Secondary | ICD-10-CM | POA: Diagnosis not present

## 2016-12-11 ENCOUNTER — Other Ambulatory Visit (HOSPITAL_COMMUNITY): Payer: Self-pay | Admitting: Internal Medicine

## 2016-12-11 ENCOUNTER — Other Ambulatory Visit: Payer: Self-pay | Admitting: Internal Medicine

## 2016-12-11 DIAGNOSIS — K8689 Other specified diseases of pancreas: Secondary | ICD-10-CM

## 2016-12-24 ENCOUNTER — Ambulatory Visit
Admission: RE | Admit: 2016-12-24 | Discharge: 2016-12-24 | Disposition: A | Discharge status: Home / Self Care | Payer: Medicare Other | Source: Ambulatory Visit | Attending: Internal Medicine | Admitting: Internal Medicine

## 2016-12-24 DIAGNOSIS — K8689 Other specified diseases of pancreas: Secondary | ICD-10-CM

## 2016-12-24 DIAGNOSIS — R109 Unspecified abdominal pain: Secondary | ICD-10-CM | POA: Diagnosis not present

## 2016-12-24 MED ORDER — GADOBENATE DIMEGLUMINE 529 MG/ML IV SOLN
20.0000 mL | Freq: Once | INTRAVENOUS | Status: AC | PRN
  Administered 2016-12-24: 20 mL via INTRAVENOUS

## 2017-01-23 DIAGNOSIS — Z794 Long term (current) use of insulin: Secondary | ICD-10-CM | POA: Diagnosis not present

## 2017-01-23 DIAGNOSIS — H2513 Age-related nuclear cataract, bilateral: Secondary | ICD-10-CM | POA: Diagnosis not present

## 2017-01-23 DIAGNOSIS — E119 Type 2 diabetes mellitus without complications: Secondary | ICD-10-CM | POA: Diagnosis not present

## 2017-02-05 DIAGNOSIS — D225 Melanocytic nevi of trunk: Secondary | ICD-10-CM | POA: Diagnosis not present

## 2017-02-05 DIAGNOSIS — L718 Other rosacea: Secondary | ICD-10-CM | POA: Diagnosis not present

## 2017-03-07 DIAGNOSIS — E1129 Type 2 diabetes mellitus with other diabetic kidney complication: Secondary | ICD-10-CM | POA: Diagnosis not present

## 2017-03-14 DIAGNOSIS — K869 Disease of pancreas, unspecified: Secondary | ICD-10-CM | POA: Diagnosis not present

## 2017-03-14 DIAGNOSIS — E785 Hyperlipidemia, unspecified: Secondary | ICD-10-CM | POA: Diagnosis not present

## 2017-03-14 DIAGNOSIS — E1149 Type 2 diabetes mellitus with other diabetic neurological complication: Secondary | ICD-10-CM | POA: Diagnosis not present

## 2017-06-08 DIAGNOSIS — E1149 Type 2 diabetes mellitus with other diabetic neurological complication: Secondary | ICD-10-CM | POA: Diagnosis not present

## 2017-06-20 DIAGNOSIS — K439 Ventral hernia without obstruction or gangrene: Secondary | ICD-10-CM | POA: Diagnosis not present

## 2017-06-20 DIAGNOSIS — Z23 Encounter for immunization: Secondary | ICD-10-CM | POA: Diagnosis not present

## 2017-06-20 DIAGNOSIS — E1149 Type 2 diabetes mellitus with other diabetic neurological complication: Secondary | ICD-10-CM | POA: Diagnosis not present

## 2017-09-05 DIAGNOSIS — E1149 Type 2 diabetes mellitus with other diabetic neurological complication: Secondary | ICD-10-CM | POA: Diagnosis not present

## 2017-09-12 DIAGNOSIS — E1340 Other specified diabetes mellitus with diabetic neuropathy, unspecified: Secondary | ICD-10-CM | POA: Diagnosis not present

## 2017-09-12 DIAGNOSIS — K869 Disease of pancreas, unspecified: Secondary | ICD-10-CM | POA: Diagnosis not present

## 2017-09-12 DIAGNOSIS — R109 Unspecified abdominal pain: Secondary | ICD-10-CM | POA: Diagnosis not present

## 2017-09-12 DIAGNOSIS — Z6834 Body mass index (BMI) 34.0-34.9, adult: Secondary | ICD-10-CM | POA: Diagnosis not present

## 2017-09-17 ENCOUNTER — Other Ambulatory Visit: Payer: Self-pay | Admitting: Internal Medicine

## 2017-09-17 DIAGNOSIS — K869 Disease of pancreas, unspecified: Secondary | ICD-10-CM

## 2017-09-23 ENCOUNTER — Ambulatory Visit
Admission: RE | Admit: 2017-09-23 | Discharge: 2017-09-23 | Disposition: A | Discharge status: Home / Self Care | Payer: Medicare Other | Source: Ambulatory Visit | Attending: Internal Medicine | Admitting: Internal Medicine

## 2017-09-23 DIAGNOSIS — K862 Cyst of pancreas: Secondary | ICD-10-CM | POA: Diagnosis not present

## 2017-09-23 DIAGNOSIS — K869 Disease of pancreas, unspecified: Secondary | ICD-10-CM

## 2017-09-23 MED ORDER — GADOBENATE DIMEGLUMINE 529 MG/ML IV SOLN
20.0000 mL | Freq: Once | INTRAVENOUS | Status: AC | PRN
  Administered 2017-09-23: 20 mL via INTRAVENOUS

## 2017-10-25 ENCOUNTER — Other Ambulatory Visit: Payer: Self-pay | Admitting: Obstetrics & Gynecology

## 2017-10-25 DIAGNOSIS — Z1231 Encounter for screening mammogram for malignant neoplasm of breast: Secondary | ICD-10-CM

## 2017-12-06 ENCOUNTER — Encounter: Payer: Self-pay | Admitting: Obstetrics & Gynecology

## 2017-12-06 ENCOUNTER — Ambulatory Visit (HOSPITAL_COMMUNITY)
Admission: RE | Admit: 2017-12-06 | Discharge: 2017-12-06 | Disposition: A | Discharge status: Home / Self Care | Payer: Medicare Other | Source: Ambulatory Visit | Attending: Obstetrics & Gynecology | Admitting: Obstetrics & Gynecology

## 2017-12-06 ENCOUNTER — Encounter (HOSPITAL_COMMUNITY): Payer: Self-pay

## 2017-12-06 ENCOUNTER — Other Ambulatory Visit: Payer: Self-pay

## 2017-12-06 ENCOUNTER — Ambulatory Visit (INDEPENDENT_AMBULATORY_CARE_PROVIDER_SITE_OTHER): Payer: Medicare Other | Admitting: Obstetrics & Gynecology

## 2017-12-06 ENCOUNTER — Other Ambulatory Visit (HOSPITAL_COMMUNITY)
Admission: RE | Admit: 2017-12-06 | Discharge: 2017-12-06 | Disposition: A | Discharge status: Home / Self Care | Payer: Medicare Other | Source: Ambulatory Visit | Attending: Obstetrics & Gynecology | Admitting: Obstetrics & Gynecology

## 2017-12-06 VITALS — BP 104/66 | HR 83 | Ht 67.0 in | Wt 223.0 lb

## 2017-12-06 DIAGNOSIS — Z124 Encounter for screening for malignant neoplasm of cervix: Secondary | ICD-10-CM | POA: Diagnosis not present

## 2017-12-06 DIAGNOSIS — Z01419 Encounter for gynecological examination (general) (routine) without abnormal findings: Secondary | ICD-10-CM

## 2017-12-06 DIAGNOSIS — Z1151 Encounter for screening for human papillomavirus (HPV): Secondary | ICD-10-CM | POA: Diagnosis not present

## 2017-12-06 DIAGNOSIS — Z1231 Encounter for screening mammogram for malignant neoplasm of breast: Secondary | ICD-10-CM | POA: Diagnosis not present

## 2017-12-06 DIAGNOSIS — Z1211 Encounter for screening for malignant neoplasm of colon: Secondary | ICD-10-CM | POA: Diagnosis not present

## 2017-12-06 DIAGNOSIS — Z1212 Encounter for screening for malignant neoplasm of rectum: Secondary | ICD-10-CM

## 2017-12-06 NOTE — Progress Notes (Signed)
Subjective:     Nancy [name] is a 52 y.o. female here for a routine exam.  No LMP recorded. Patient has had an ablation. G0P0000 Birth Control Method:  S/p oophorectomy and ablation Menstrual Calendar(currently): since ablation  Current complaints: none.   Current acute medical issues:  none   Recent Gynecologic History No LMP recorded. Patient has had an ablation. Last Pap: 2015,  normal Last mammogram: today,  pending  Past Medical History:  Diagnosis Date  . Back pain   . Complication of anesthesia   . Diabetes mellitus   . PONV (postoperative nausea and vomiting)   . Rosacea   . Small bowel obstruction ([place])   . Thyroid disease   . Ventral hernia     Past Surgical History:  Procedure Laterality Date  . BACK SURGERY     x3; fusion with rods and screws  . carpatunnel surgery Bilateral   . CHOLECYSTECTOMY    . COLON SURGERY    . COLOSTOMY    . ENDOMETRIAL ABLATION    . INSERTION OF MESH  08/03/2012   Procedure: INSERTION OF MESH;  Surgeon: Gwenyth Ober, MD;  Location: [place];  Service: General;  Laterality: N/A;  . LAPAROSCOPIC BILATERAL SALPINGO OOPHERECTOMY Bilateral 05/12/2015   Procedure: LAPAROSCOPIC BILATERAL SALPINGO OOPHORECTOMY;  Surgeon: Florian Buff, MD;  Location: [facility];  Service: Gynecology;  Laterality: Bilateral;  . LAPAROSCOPIC LYSIS OF ADHESIONS Bilateral 05/12/2015   Procedure: EXTENSIVE LAPAROSCOPIC LYSIS OF ADHESIONS;  Surgeon: Florian Buff, MD;  Location: [facility];  Service: Gynecology;  Laterality: Bilateral;  . LAPAROTOMY  08/03/2012   Procedure: EXPLORATORY LAPAROTOMY;  Surgeon: Gwenyth Ober, MD;  Location: [place];  Service: General;  Laterality: N/A;  with extensive enterolysis  . LIVER BIOPSY    . REVISION COLOSTOMY    . VENTRAL HERNIA REPAIR  08/03/2012   Procedure: HERNIA REPAIR VENTRAL ADULT;  Surgeon: Gwenyth Ober, MD;  Location: [place];  Service: General;  Laterality: N/A;  . VENTRAL HERNIA REPAIR  03/13/2016   Procedure: INCISIONAL  HERNIA REPAIR WITH MESH;  Surgeon: Aviva Signs, MD;  Location: [facility];  Service: General;;    OB History    Gravida  0   Para  0   Term  0   Preterm  0   AB  0   Living  0     SAB  0   TAB  0   Ectopic  0   Multiple  0   Live Births  0           Social History   Socioeconomic History  . Marital status: Married    Spouse name: Not on file  . Number of children: Not on file  . Years of education: Not on file  . Highest education level: Not on file  Occupational History  . Not on file  Social Needs  . Financial resource strain: Not on file  . Food insecurity:    Worry: Not on file    Inability: Not on file  . Transportation needs:    Medical: Not on file    Non-medical: Not on file  Tobacco Use  . Smoking status: Never Smoker  . Smokeless tobacco: Never Used  Substance and Sexual Activity  . Alcohol use: No    Alcohol/week: 0.0 oz  . Drug use: No  . Sexual activity: Yes  Lifestyle  . Physical activity:    Days per week: Not on file  Minutes per session: Not on file  . Stress: Not on file  Relationships  . Social connections:    Talks on phone: Not on file    Gets together: Not on file    Attends religious service: Not on file    Active member of club or organization: Not on file    Attends meetings of clubs or organizations: Not on file    Relationship status: Not on file  Other Topics Concern  . Not on file  Social History Narrative  . Not on file    Family History  Problem Relation Age of Onset  . Thyroid disease Mother   . Heart disease Father   . Diabetes Mellitus I Maternal Grandmother   . Diabetes Mellitus I Maternal Grandfather   . Diabetes Mellitus I Paternal Grandmother   . Diabetes Mellitus I Paternal Grandfather      Current Outpatient Medications:  .  aspirin 81 MG EC tablet, Take 81 mg by mouth every evening. , Disp: , Rfl:  .  atorvastatin (LIPITOR) 10 MG tablet, Take 10 mg by mouth every other day. , Disp: , Rfl:   .  Canagliflozin (INVOKANA) 300 MG TABS, Take 150 mg by mouth every morning. Before breakfast , Disp: , Rfl:  .  Cholecalciferol 5000 UNITS capsule, Take 5,000 Units by mouth every morning. Vitamin D, Disp: , Rfl:  .  enalapril (VASOTEC) 5 MG tablet, Take 5 mg by mouth at bedtime.  , Disp: , Rfl:  .  insulin aspart (NOVOLOG) 100 unit/mL injection, Inject 45 Units into the skin 3 (three) times daily before meals., Disp: , Rfl:  .  levothyroxine (SYNTHROID, LEVOTHROID) 150 MCG tablet, Take 150 mcg by mouth daily before breakfast., Disp: , Rfl:  .  Liraglutide (VICTOZA) 18 MG/3ML SOLN, Inject 1.8 mg into the skin at bedtime. , Disp: , Rfl:  .  metFORMIN (GLUCOPHAGE) 1000 MG tablet, Take 1 tablet (1,000 mg total) by mouth 2 (two) times daily., Disp: , Rfl:  .  methocarbamol (ROBAXIN) 500 MG tablet, Take 500 mg by mouth every 6 (six) hours as needed for muscle spasms., Disp: , Rfl:  .  MetroNIDAZOLE-Cleanser 0.75 % (Cream) KIT, Apply 1 application topically 2 (two) times daily. To face for rosacea, Disp: , Rfl:  .  doxycycline (VIBRA-TABS) 100 MG tablet, Take 100 mg by mouth 2 (two) times daily., Disp: , Rfl:   Review of Systems  Review of Systems  Constitutional: Negative for fever, chills, weight loss, malaise/fatigue and diaphoresis.  HENT: Negative for hearing loss, ear pain, nosebleeds, congestion, sore throat, neck pain, tinnitus and ear discharge.   Eyes: Negative for blurred vision, double vision, photophobia, pain, discharge and redness.  Respiratory: Negative for cough, hemoptysis, sputum production, shortness of breath, wheezing and stridor.   Cardiovascular: Negative for chest pain, palpitations, orthopnea, claudication, leg swelling and PND.  Gastrointestinal: negative for abdominal pain. Negative for heartburn, nausea, vomiting, diarrhea, constipation, blood in stool and melena.  Genitourinary: Negative for dysuria, urgency, frequency, hematuria and flank pain.  Musculoskeletal:  Negative for myalgias, back pain, joint pain and falls.  Skin: Negative for itching and rash.  Neurological: Negative for dizziness, tingling, tremors, sensory change, speech change, focal weakness, seizures, loss of consciousness, weakness and headaches.  Endo/Heme/Allergies: Negative for environmental allergies and polydipsia. Does not bruise/bleed easily.  Psychiatric/Behavioral: Negative for depression, suicidal ideas, hallucinations, memory loss and substance abuse. The patient is not nervous/anxious and does not have insomnia.  Objective:  Blood pressure 104/66, pulse 83, height 5' 7" (1.702 m), weight 223 lb (101.2 kg).   Physical Exam  Vitals reviewed. Constitutional: She is oriented to person, place, and time. She appears well-developed and well-nourished.  HENT:  Head: Normocephalic and atraumatic.        Right Ear: External ear normal.  Left Ear: External ear normal.  Nose: Nose normal.  Mouth/Throat: Oropharynx is clear and moist.  Eyes: Conjunctivae and EOM are normal. Pupils are equal, round, and reactive to light. Right eye exhibits no discharge. Left eye exhibits no discharge. No scleral icterus.  Neck: Normal range of motion. Neck supple. No tracheal deviation present. No thyromegaly present.  Cardiovascular: Normal rate, regular rhythm, normal heart sounds and intact distal pulses.  Exam reveals no gallop and no friction rub.   No murmur heard. Respiratory: Effort normal and breath sounds normal. No respiratory distress. She has no wheezes. She has no rales. She exhibits no tenderness.  GI: Soft. Bowel sounds are normal. She exhibits no distension and no mass. There is no tenderness. There is no rebound and no guarding.  Genitourinary:  Breasts no masses skin changes or nipple changes bilaterally      Vulva is normal without lesions Vagina is pink moist without discharge Cervix normal in appearance and pap is done Uterus is normal size shape and contour Adnexa  is negative with normal sized ovaries  {Rectal    hemoccult negative, normal tone, no masses  Musculoskeletal: Normal range of motion. She exhibits no edema and no tenderness.  Neurological: She is alert and oriented to person, place, and time. She has normal reflexes. She displays normal reflexes. No cranial nerve deficit. She exhibits normal muscle tone. Coordination normal.  Skin: Skin is warm and dry. No rash noted. No erythema. No pallor.  Psychiatric: She has a normal mood and affect. Her behavior is normal. Judgment and thought content normal.       Medications Ordered at today's visit: No orders of the defined types were placed in this encounter.   Other orders placed at today's visit: No orders of the defined types were placed in this encounter.     Assessment:    Normal Gyn  female exam.    Plan:    Mammogram ordered. Follow up in: 2 years.     No follow-ups on file.

## 2017-12-11 LAB — CYTOLOGY - PAP
DIAGNOSIS: NEGATIVE
HPV (WINDOPATH): NOT DETECTED

## 2017-12-12 DIAGNOSIS — K769 Liver disease, unspecified: Secondary | ICD-10-CM | POA: Diagnosis not present

## 2017-12-12 DIAGNOSIS — E039 Hypothyroidism, unspecified: Secondary | ICD-10-CM | POA: Diagnosis not present

## 2017-12-12 DIAGNOSIS — I10 Essential (primary) hypertension: Secondary | ICD-10-CM | POA: Diagnosis not present

## 2017-12-12 DIAGNOSIS — E1140 Type 2 diabetes mellitus with diabetic neuropathy, unspecified: Secondary | ICD-10-CM | POA: Diagnosis not present

## 2017-12-12 DIAGNOSIS — E785 Hyperlipidemia, unspecified: Secondary | ICD-10-CM | POA: Diagnosis not present

## 2017-12-12 DIAGNOSIS — Z79899 Other long term (current) drug therapy: Secondary | ICD-10-CM | POA: Diagnosis not present

## 2017-12-19 DIAGNOSIS — E1140 Type 2 diabetes mellitus with diabetic neuropathy, unspecified: Secondary | ICD-10-CM | POA: Diagnosis not present

## 2017-12-19 DIAGNOSIS — Z6834 Body mass index (BMI) 34.0-34.9, adult: Secondary | ICD-10-CM | POA: Diagnosis not present

## 2017-12-19 DIAGNOSIS — E039 Hypothyroidism, unspecified: Secondary | ICD-10-CM | POA: Diagnosis not present

## 2018-02-07 DIAGNOSIS — L718 Other rosacea: Secondary | ICD-10-CM | POA: Diagnosis not present

## 2018-02-07 DIAGNOSIS — D225 Melanocytic nevi of trunk: Secondary | ICD-10-CM | POA: Diagnosis not present

## 2018-02-18 DIAGNOSIS — M65312 Trigger thumb, left thumb: Secondary | ICD-10-CM | POA: Diagnosis not present

## 2018-02-25 DIAGNOSIS — M65312 Trigger thumb, left thumb: Secondary | ICD-10-CM | POA: Diagnosis not present

## 2018-02-25 DIAGNOSIS — M180 Bilateral primary osteoarthritis of first carpometacarpal joints: Secondary | ICD-10-CM | POA: Diagnosis not present

## 2018-02-27 DIAGNOSIS — M5440 Lumbago with sciatica, unspecified side: Secondary | ICD-10-CM | POA: Diagnosis not present

## 2018-02-27 DIAGNOSIS — Z6835 Body mass index (BMI) 35.0-35.9, adult: Secondary | ICD-10-CM | POA: Diagnosis not present

## 2018-03-27 DIAGNOSIS — E1140 Type 2 diabetes mellitus with diabetic neuropathy, unspecified: Secondary | ICD-10-CM | POA: Diagnosis not present

## 2018-03-27 DIAGNOSIS — M65312 Trigger thumb, left thumb: Secondary | ICD-10-CM | POA: Diagnosis not present

## 2018-03-27 DIAGNOSIS — M180 Bilateral primary osteoarthritis of first carpometacarpal joints: Secondary | ICD-10-CM | POA: Diagnosis not present

## 2018-03-27 DIAGNOSIS — Z79899 Other long term (current) drug therapy: Secondary | ICD-10-CM | POA: Diagnosis not present

## 2018-03-27 DIAGNOSIS — E039 Hypothyroidism, unspecified: Secondary | ICD-10-CM | POA: Diagnosis not present

## 2018-03-27 DIAGNOSIS — E785 Hyperlipidemia, unspecified: Secondary | ICD-10-CM | POA: Diagnosis not present

## 2018-04-03 DIAGNOSIS — E039 Hypothyroidism, unspecified: Secondary | ICD-10-CM | POA: Diagnosis not present

## 2018-04-03 DIAGNOSIS — E11319 Type 2 diabetes mellitus with unspecified diabetic retinopathy without macular edema: Secondary | ICD-10-CM | POA: Diagnosis not present

## 2018-05-03 ENCOUNTER — Other Ambulatory Visit: Payer: Self-pay | Admitting: Orthopedic Surgery

## 2018-05-03 DIAGNOSIS — M180 Bilateral primary osteoarthritis of first carpometacarpal joints: Secondary | ICD-10-CM | POA: Diagnosis not present

## 2018-05-03 DIAGNOSIS — M65312 Trigger thumb, left thumb: Secondary | ICD-10-CM | POA: Diagnosis not present

## 2018-05-16 ENCOUNTER — Encounter (HOSPITAL_BASED_OUTPATIENT_CLINIC_OR_DEPARTMENT_OTHER): Payer: Self-pay | Admitting: *Deleted

## 2018-05-16 ENCOUNTER — Other Ambulatory Visit: Payer: Self-pay

## 2018-05-17 ENCOUNTER — Encounter (HOSPITAL_BASED_OUTPATIENT_CLINIC_OR_DEPARTMENT_OTHER)
Admission: RE | Admit: 2018-05-17 | Discharge: 2018-05-17 | Disposition: A | Discharge status: Home / Self Care | Payer: Medicare Other | Source: Ambulatory Visit | Attending: Orthopedic Surgery | Admitting: Orthopedic Surgery

## 2018-05-17 DIAGNOSIS — Z01818 Encounter for other preprocedural examination: Secondary | ICD-10-CM | POA: Insufficient documentation

## 2018-05-17 LAB — BASIC METABOLIC PANEL
Anion gap: 10 (ref 5–15)
BUN: 10 mg/dL (ref 6–20)
CO2: 26 mmol/L (ref 22–32)
CREATININE: 0.49 mg/dL (ref 0.44–1.00)
Calcium: 9.1 mg/dL (ref 8.9–10.3)
Chloride: 102 mmol/L (ref 98–111)
GFR calc Af Amer: >60 mL/min (ref >60)
GFR calc non Af Amer: >60 mL/min (ref >60)
Glucose, Bld: 142 mg/dL — ABNORMAL HIGH (ref 70–99)
Potassium: 4.1 mmol/L (ref 3.5–5.1)
SODIUM: 138 mmol/L (ref 135–145)

## 2018-05-17 NOTE — Progress Notes (Signed)
EKG reviewed by Dr. Oren Bracket, will proceed with surgery as scheduled.

## 2018-05-23 ENCOUNTER — Ambulatory Visit (HOSPITAL_BASED_OUTPATIENT_CLINIC_OR_DEPARTMENT_OTHER): Payer: Medicare Other | Admitting: Anesthesiology

## 2018-05-23 ENCOUNTER — Ambulatory Visit (HOSPITAL_BASED_OUTPATIENT_CLINIC_OR_DEPARTMENT_OTHER)
Admission: RE | Admit: 2018-05-23 | Discharge: 2018-05-23 | Disposition: A | Discharge status: Home / Self Care | Payer: Medicare Other | Source: Ambulatory Visit | Attending: Orthopedic Surgery | Admitting: Orthopedic Surgery

## 2018-05-23 ENCOUNTER — Encounter (HOSPITAL_BASED_OUTPATIENT_CLINIC_OR_DEPARTMENT_OTHER): Payer: Self-pay | Admitting: Anesthesiology

## 2018-05-23 ENCOUNTER — Encounter (HOSPITAL_BASED_OUTPATIENT_CLINIC_OR_DEPARTMENT_OTHER)
Admission: RE | Disposition: A | Discharge status: Home / Self Care | Payer: Self-pay | Source: Ambulatory Visit | Attending: Orthopedic Surgery

## 2018-05-23 ENCOUNTER — Other Ambulatory Visit: Payer: Self-pay

## 2018-05-23 DIAGNOSIS — Z880 Allergy status to penicillin: Secondary | ICD-10-CM | POA: Diagnosis not present

## 2018-05-23 DIAGNOSIS — E039 Hypothyroidism, unspecified: Secondary | ICD-10-CM | POA: Diagnosis not present

## 2018-05-23 DIAGNOSIS — I10 Essential (primary) hypertension: Secondary | ICD-10-CM | POA: Diagnosis not present

## 2018-05-23 DIAGNOSIS — Z79899 Other long term (current) drug therapy: Secondary | ICD-10-CM | POA: Insufficient documentation

## 2018-05-23 DIAGNOSIS — Z794 Long term (current) use of insulin: Secondary | ICD-10-CM | POA: Insufficient documentation

## 2018-05-23 DIAGNOSIS — Z882 Allergy status to sulfonamides status: Secondary | ICD-10-CM | POA: Insufficient documentation

## 2018-05-23 DIAGNOSIS — M19042 Primary osteoarthritis, left hand: Secondary | ICD-10-CM | POA: Insufficient documentation

## 2018-05-23 DIAGNOSIS — M65312 Trigger thumb, left thumb: Secondary | ICD-10-CM | POA: Diagnosis not present

## 2018-05-23 DIAGNOSIS — E119 Type 2 diabetes mellitus without complications: Secondary | ICD-10-CM | POA: Insufficient documentation

## 2018-05-23 DIAGNOSIS — M1812 Unilateral primary osteoarthritis of first carpometacarpal joint, left hand: Secondary | ICD-10-CM | POA: Diagnosis not present

## 2018-05-23 HISTORY — PX: TENDON TRANSFER: SHX6109

## 2018-05-23 HISTORY — PX: TRIGGER FINGER RELEASE: SHX641

## 2018-05-23 HISTORY — PX: CARPOMETACARPEL SUSPENSION PLASTY: SHX5005

## 2018-05-23 LAB — GLUCOSE, CAPILLARY
GLUCOSE-CAPILLARY: 143 mg/dL — AB (ref 70–99)
Glucose-Capillary: 111 mg/dL — ABNORMAL HIGH (ref 70–99)

## 2018-05-23 SURGERY — RELEASE, A1 PULLEY, FOR TRIGGER FINGER
Anesthesia: Regional | Site: Thumb | Laterality: Left

## 2018-05-23 MED ORDER — DEXAMETHASONE SODIUM PHOSPHATE 10 MG/ML IJ SOLN
INTRAMUSCULAR | Status: AC
  Filled 2018-05-23: qty 1

## 2018-05-23 MED ORDER — CEFAZOLIN SODIUM-DEXTROSE 2-4 GM/100ML-% IV SOLN
INTRAVENOUS | Status: AC
  Filled 2018-05-23: qty 100

## 2018-05-23 MED ORDER — CHLORHEXIDINE GLUCONATE 4 % EX LIQD: 60.0000 mL | Freq: Once | CUTANEOUS | Status: DC

## 2018-05-23 MED ORDER — VANCOMYCIN HCL IN DEXTROSE 1-5 GM/200ML-% IV SOLN: 1000.0000 mg | INTRAVENOUS | Status: DC

## 2018-05-23 MED ORDER — FENTANYL CITRATE (PF) 100 MCG/2ML IJ SOLN
INTRAMUSCULAR | Status: AC
  Filled 2018-05-23: qty 2

## 2018-05-23 MED ORDER — FENTANYL CITRATE (PF) 100 MCG/2ML IJ SOLN
50.0000 ug | INTRAMUSCULAR | Status: AC | PRN
  Administered 2018-05-23: 50 ug via INTRAVENOUS
  Administered 2018-05-23: 25 ug via INTRAVENOUS
  Administered 2018-05-23: 50 ug via INTRAVENOUS

## 2018-05-23 MED ORDER — ONDANSETRON HCL 4 MG/2ML IJ SOLN
INTRAMUSCULAR | Status: AC
  Filled 2018-05-23: qty 2

## 2018-05-23 MED ORDER — PROPOFOL 10 MG/ML IV BOLUS
INTRAVENOUS | Status: AC
  Filled 2018-05-23: qty 20

## 2018-05-23 MED ORDER — ONDANSETRON HCL 4 MG/2ML IJ SOLN: 4.0000 mg | Freq: Once | INTRAMUSCULAR | Status: DC | PRN

## 2018-05-23 MED ORDER — OXYCODONE-ACETAMINOPHEN 5-325 MG PO TABS: 1.0000 | ORAL_TABLET | ORAL | 0 refills | Status: DC | PRN

## 2018-05-23 MED ORDER — MIDAZOLAM HCL 2 MG/2ML IJ SOLN
INTRAMUSCULAR | Status: AC
  Filled 2018-05-23: qty 2

## 2018-05-23 MED ORDER — PROPOFOL 500 MG/50ML IV EMUL
INTRAVENOUS | Status: AC
  Filled 2018-05-23: qty 50

## 2018-05-23 MED ORDER — BUPIVACAINE HCL (PF) 0.25 % IJ SOLN
INTRAMUSCULAR | Status: AC
  Filled 2018-05-23: qty 30

## 2018-05-23 MED ORDER — LIDOCAINE 2% (20 MG/ML) 5 ML SYRINGE
INTRAMUSCULAR | Status: AC
  Filled 2018-05-23: qty 5

## 2018-05-23 MED ORDER — VANCOMYCIN HCL IN DEXTROSE 1-5 GM/200ML-% IV SOLN
INTRAVENOUS | Status: AC
  Filled 2018-05-23: qty 200

## 2018-05-23 MED ORDER — ONDANSETRON HCL 4 MG/2ML IJ SOLN
INTRAMUSCULAR | Status: DC | PRN
  Administered 2018-05-23: 4 mg via INTRAVENOUS

## 2018-05-23 MED ORDER — VANCOMYCIN HCL IN DEXTROSE 500-5 MG/100ML-% IV SOLN
INTRAVENOUS | Status: AC
  Filled 2018-05-23: qty 100

## 2018-05-23 MED ORDER — MIDAZOLAM HCL 2 MG/2ML IJ SOLN
1.0000 mg | INTRAMUSCULAR | Status: DC | PRN
  Administered 2018-05-23: 1 mg via INTRAVENOUS
  Administered 2018-05-23: 2 mg via INTRAVENOUS
  Administered 2018-05-23: 1 mg via INTRAVENOUS

## 2018-05-23 MED ORDER — LIDOCAINE HCL (CARDIAC) PF 100 MG/5ML IV SOSY
PREFILLED_SYRINGE | INTRAVENOUS | Status: DC | PRN
  Administered 2018-05-23: 50 mg via INTRAVENOUS

## 2018-05-23 MED ORDER — SCOPOLAMINE 1 MG/3DAYS TD PT72
MEDICATED_PATCH | TRANSDERMAL | Status: AC
  Filled 2018-05-23: qty 1

## 2018-05-23 MED ORDER — PROPOFOL 500 MG/50ML IV EMUL
INTRAVENOUS | Status: DC | PRN
  Administered 2018-05-23: 50 ug/kg/min via INTRAVENOUS

## 2018-05-23 MED ORDER — CEFAZOLIN SODIUM-DEXTROSE 2-3 GM-%(50ML) IV SOLR
INTRAVENOUS | Status: DC | PRN
  Administered 2018-05-23: 2 g via INTRAVENOUS

## 2018-05-23 MED ORDER — SCOPOLAMINE 1 MG/3DAYS TD PT72
1.0000 | MEDICATED_PATCH | Freq: Once | TRANSDERMAL | Status: DC | PRN
  Administered 2018-05-23: 1.5 mg via TRANSDERMAL

## 2018-05-23 MED ORDER — 0.9 % SODIUM CHLORIDE (POUR BTL) OPTIME
TOPICAL | Status: DC | PRN
  Administered 2018-05-23: 300 mL

## 2018-05-23 MED ORDER — ROPIVACAINE HCL 5 MG/ML IJ SOLN
INTRAMUSCULAR | Status: DC | PRN
  Administered 2018-05-23: 30 mL via PERINEURAL

## 2018-05-23 MED ORDER — VANCOMYCIN HCL 10 G IV SOLR: 1500.0000 mg | Freq: Once | INTRAVENOUS | Status: DC

## 2018-05-23 MED ORDER — LACTATED RINGERS IV SOLN
INTRAVENOUS | Status: DC
  Administered 2018-05-23: 07:00:00 via INTRAVENOUS

## 2018-05-23 SURGICAL SUPPLY — 91 items
BAG DECANTER FOR FLEXI CONT (MISCELLANEOUS) IMPLANT
BALL CTTN LRG ABS STRL LF (GAUZE/BANDAGES/DRESSINGS)
BANDAGE COBAN STERILE 2 (GAUZE/BANDAGES/DRESSINGS) ×3 IMPLANT
BIT DRILL PASSING CMC 1/4 FLEX (BIT) ×1 IMPLANT
BLADE MINI RND TIP GREEN BEAV (BLADE) ×3 IMPLANT
BLADE SURG 15 STRL LF DISP TIS (BLADE) ×1 IMPLANT
BLADE SURG 15 STRL SS (BLADE) ×3
BNDG CMPR 9X4 STRL LF SNTH (GAUZE/BANDAGES/DRESSINGS) ×1
BNDG COHESIVE 3X5 TAN STRL LF (GAUZE/BANDAGES/DRESSINGS) ×3 IMPLANT
BNDG ESMARK 4X9 LF (GAUZE/BANDAGES/DRESSINGS) ×3 IMPLANT
BNDG GAUZE ELAST 4 BULKY (GAUZE/BANDAGES/DRESSINGS) ×3 IMPLANT
BUTTON ALL-SUT W/BACKSTOP (Orthopedic Implant) ×3 IMPLANT
CHLORAPREP W/TINT 26ML (MISCELLANEOUS) ×3 IMPLANT
CORD BIPOLAR FORCEPS 12FT (ELECTRODE) ×3 IMPLANT
COTTONBALL LRG STERILE PKG (GAUZE/BANDAGES/DRESSINGS) IMPLANT
COVER BACK TABLE 60X90IN (DRAPES) ×3 IMPLANT
COVER MAYO STAND STRL (DRAPES) ×3 IMPLANT
CUFF TOURNIQUET SINGLE 18IN (TOURNIQUET CUFF) ×3 IMPLANT
DECANTER SPIKE VIAL GLASS SM (MISCELLANEOUS) IMPLANT
DRAIN TLS ROUND 10FR (DRAIN) IMPLANT
DRAPE EXTREMITY T 121X128X90 (DRAPE) ×3 IMPLANT
DRAPE OEC MINIVIEW 54X84 (DRAPES) ×3 IMPLANT
DRAPE SURG 17X23 STRL (DRAPES) ×3 IMPLANT
DRILL PASSING CMC 1/4 FLEX (BIT) ×3
DRSG PAD ABDOMINAL 8X10 ST (GAUZE/BANDAGES/DRESSINGS) IMPLANT
GAUZE 4X4 16PLY RFD (DISPOSABLE) IMPLANT
GAUZE SPONGE 4X4 12PLY STRL (GAUZE/BANDAGES/DRESSINGS) ×3 IMPLANT
GAUZE XEROFORM 1X8 LF (GAUZE/BANDAGES/DRESSINGS) ×3 IMPLANT
GLOVE BIO SURGEON STRL SZ7.5 (GLOVE) ×3 IMPLANT
GLOVE BIOGEL PI IND STRL 7.0 (GLOVE) ×1 IMPLANT
GLOVE BIOGEL PI IND STRL 7.5 (GLOVE) ×1 IMPLANT
GLOVE BIOGEL PI IND STRL 8 (GLOVE) ×1 IMPLANT
GLOVE BIOGEL PI IND STRL 8.5 (GLOVE) ×1 IMPLANT
GLOVE BIOGEL PI INDICATOR 7.0 (GLOVE) ×2
GLOVE BIOGEL PI INDICATOR 7.5 (GLOVE) ×2
GLOVE BIOGEL PI INDICATOR 8 (GLOVE) ×2
GLOVE BIOGEL PI INDICATOR 8.5 (GLOVE) ×2
GLOVE SURG ORTHO 8.0 STRL STRW (GLOVE) ×3 IMPLANT
GLOVE SURG SYN 7.5  E (GLOVE) ×2
GLOVE SURG SYN 7.5 E (GLOVE) ×1 IMPLANT
GOWN STRL REUS W/ TWL LRG LVL3 (GOWN DISPOSABLE) ×1 IMPLANT
GOWN STRL REUS W/ TWL XL LVL3 (GOWN DISPOSABLE) ×2 IMPLANT
GOWN STRL REUS W/TWL LRG LVL3 (GOWN DISPOSABLE) ×3
GOWN STRL REUS W/TWL XL LVL3 (GOWN DISPOSABLE) ×9 IMPLANT
K-WIRE .035X4 (WIRE) IMPLANT
LOOP VESSEL MAXI BLUE (MISCELLANEOUS) IMPLANT
NEEDLE HYPO 22GX1.5 SAFETY (NEEDLE) IMPLANT
NEEDLE KEITH (NEEDLE) IMPLANT
NEEDLE PRECISIONGLIDE 27X1.5 (NEEDLE) IMPLANT
NS IRRIG 1000ML POUR BTL (IV SOLUTION) ×3 IMPLANT
PACK BASIN DAY SURGERY FS (CUSTOM PROCEDURE TRAY) ×3 IMPLANT
PAD CAST 3X4 CTTN HI CHSV (CAST SUPPLIES) ×1 IMPLANT
PADDING CAST ABS 3INX4YD NS (CAST SUPPLIES)
PADDING CAST ABS 4INX4YD NS (CAST SUPPLIES)
PADDING CAST ABS COTTON 3X4 (CAST SUPPLIES) IMPLANT
PADDING CAST ABS COTTON 4X4 ST (CAST SUPPLIES) IMPLANT
PADDING CAST COTTON 3X4 STRL (CAST SUPPLIES) ×3
SLEEVE SCD COMPRESS KNEE MED (MISCELLANEOUS) ×3 IMPLANT
SLING ARM FOAM STRAP LRG (SOFTGOODS) ×3 IMPLANT
SPLINT PLASTER CAST XFAST 3X15 (CAST SUPPLIES) ×10 IMPLANT
SPLINT PLASTER XTRA FASTSET 3X (CAST SUPPLIES) ×20
STOCKINETTE 4X48 STRL (DRAPES) ×3 IMPLANT
SUT CHROMIC 5 0 P 3 (SUTURE) IMPLANT
SUT ETHIBOND 3-0 V-5 (SUTURE) ×3 IMPLANT
SUT ETHILON 4 0 PS 2 18 (SUTURE) ×3 IMPLANT
SUT FIBERWIRE 2-0 18 17.9 3/8 (SUTURE)
SUT FIBERWIRE 4-0 18 DIAM BLUE (SUTURE)
SUT FIBERWIRE 4-0 18 TAPR NDL (SUTURE)
SUT MERSILENE 2.0 SH NDLE (SUTURE) IMPLANT
SUT MERSILENE 4 0 P 3 (SUTURE) IMPLANT
SUT PROLENE 2 0 SH DA (SUTURE) IMPLANT
SUT SILK 2 0 PERMA HAND 18 BK (SUTURE) IMPLANT
SUT SILK 4 0 PS 2 (SUTURE) IMPLANT
SUT STEEL 3 0 (SUTURE) ×3 IMPLANT
SUT VIC AB 3-0 PS1 18 (SUTURE)
SUT VIC AB 3-0 PS1 18XBRD (SUTURE) IMPLANT
SUT VIC AB 4-0 P-3 18XBRD (SUTURE) IMPLANT
SUT VIC AB 4-0 P2 18 (SUTURE) IMPLANT
SUT VIC AB 4-0 P3 18 (SUTURE)
SUT VIC AB 4-0 RB1 27 (SUTURE) ×3
SUT VIC AB 4-0 RB1 27X BRD (SUTURE) ×1 IMPLANT
SUT VICRYL 4-0 PS2 18IN ABS (SUTURE) IMPLANT
SUTURE FIBERWR 2-0 18 17.9 3/8 (SUTURE) IMPLANT
SUTURE FIBERWR 4-0 18 DIA BLUE (SUTURE) IMPLANT
SUTURE FIBERWR 4-0 18 TAPR NDL (SUTURE) IMPLANT
SYR BULB 3OZ (MISCELLANEOUS) ×3 IMPLANT
SYR CONTROL 10ML LL (SYRINGE) IMPLANT
TOWEL GREEN STERILE FF (TOWEL DISPOSABLE) ×6 IMPLANT
TOWEL OR NON WOVEN STRL DISP B (DISPOSABLE) IMPLANT
TUBE FEEDING ENTERAL 5FR 16IN (TUBING) IMPLANT
UNDERPAD 30X30 (UNDERPADS AND DIAPERS) ×3 IMPLANT

## 2018-05-23 NOTE — Anesthesia Postprocedure Evaluation (Signed)
Anesthesia Post Note  Patient: Nancy [name]  Procedure(s) Performed: RELEASE TRIGGER FINGER/A-1 PULLEY LEFT THUMB (Left Thumb) CARPOMETACARPEL (CMC) SUSPENSION PLASTY LEFT THUMB (Left Thumb) TENDON TRANSFER WITH TRAPEZIUM EXCISION (Left Thumb)     Patient location during evaluation: PACU Anesthesia Type: Regional Level of consciousness: awake and alert Pain management: pain level controlled Vital Signs Assessment: post-procedure vital signs reviewed and stable Respiratory status: spontaneous breathing, nonlabored ventilation, respiratory function stable and patient connected to nasal cannula oxygen Cardiovascular status: stable and blood pressure returned to baseline Postop Assessment: no apparent nausea or vomiting Anesthetic complications: no    Last Vitals:  Vitals:   05/23/18 1015 05/23/18 1031  BP: (!) 145/80 (!) 144/84  Pulse: 61 65  Resp: 12 12  Temp:  36.4 C  SpO2: 94% 95%    Last Pain:  Vitals:   05/23/18 1031  TempSrc:   PainSc: 0-No pain                 Lidia Collum

## 2018-05-23 NOTE — H&P (Signed)
Nancy [name] is an 52 y.o. female.   Chief Complaint: pain and catching left thumb HPI: Nancy [name]  is 3 right-handed  is status post bilateral carpal tunnel release by myself in 1999. She is complaining of catching of her left thumb which is been going on for approximately 1 month beginning in June. She recalls no history of injury. She states she is having some discomfort in her whole hand is but has a difficult time describing it. She states it is relatively constant. The catching of her thumb produces of a pain with attempting to strain with a VAS score 10/10. Otherwise she is complaining of a throbbing type pain. She has not had any treatment for this is Dr. Willey Blade referred her and placed her on Advil which has given her minimal relief. She states heat helps. She has had 2 injections to the thumb and continues to complain of pain both the basilar joint and at the P joint of her left thumb. States it is severe in her MP joint moderate at her [facility] joint. She feels as a scale of 5-6 over 10 at the carpometacarpal joint. She has a history of diabetes thyroid problems no history of arthritis or gout. Family history is positive diabetes thyroid problems negative for arthritis and gout.    Past Medical History:  Diagnosis Date  . Back pain   . Complication of anesthesia   . Diabetes mellitus   . PONV (postoperative nausea and vomiting)   . Rosacea   . Small bowel obstruction ([place])   . Thyroid disease   . Ventral hernia     Past Surgical History:  Procedure Laterality Date  . BACK SURGERY     x3; fusion with rods and screws  . carpatunnel surgery Bilateral   . CHOLECYSTECTOMY    . COLON SURGERY    . COLOSTOMY    . ENDOMETRIAL ABLATION    . INSERTION OF MESH  08/03/2012   Procedure: INSERTION OF MESH;  Surgeon: Gwenyth Ober, MD;  Location: [place];  Service: General;  Laterality: N/A;  . LAPAROSCOPIC BILATERAL SALPINGO OOPHERECTOMY Bilateral 05/12/2015   Procedure: LAPAROSCOPIC BILATERAL  SALPINGO OOPHORECTOMY;  Surgeon: Florian Buff, MD;  Location: [facility];  Service: Gynecology;  Laterality: Bilateral;  . LAPAROSCOPIC LYSIS OF ADHESIONS Bilateral 05/12/2015   Procedure: EXTENSIVE LAPAROSCOPIC LYSIS OF ADHESIONS;  Surgeon: Florian Buff, MD;  Location[facility];  Service: Gynecology;  Laterality: Bilateral;  . LAPAROTOMY  08/03/2012   Procedure: EXPLORATORY LAPAROTOMY;  Surgeon: Gwenyth Ober, MD;  Location: [place];  Service: General;  Laterality: N/A;  with extensive enterolysis  . LIVER BIOPSY    . REVISION COLOSTOMY    . VENTRAL HERNIA REPAIR  08/03/2012   Procedure: HERNIA REPAIR VENTRAL ADULT;  Surgeon: Gwenyth Ober, MD;  Location: [place];  Service: General;  Laterality: N/A;  . VENTRAL HERNIA REPAIR  03/13/2016   Procedure: INCISIONAL HERNIA REPAIR WITH MESH;  Surgeon: Aviva Signs, MD;  Location: [facility];  Service: General;;    Family History  Problem Relation Age of Onset  . Thyroid disease Mother   . Heart disease Father   . Diabetes Mellitus I Maternal Grandmother   . Diabetes Mellitus I Maternal Grandfather   . Diabetes Mellitus I Paternal Grandmother   . Diabetes Mellitus I Paternal Grandfather    Social History:  reports that she has never smoked. She has never used smokeless tobacco. She reports that she does not drink alcohol or  use drugs.  Allergies:  Allergies  Allergen Reactions  . Chocolate Other (See Comments)    rosacea   . Sulfa Antibiotics Nausea Only  . Penicillin G Rash    Has patient had a PCN reaction causing immediate rash, facial/tongue/throat swelling, SOB or lightheadedness with hypotension: Yes Has patient had a PCN reaction causing severe rash involving mucus membranes or skin necrosis: No Has patient had a PCN reaction that required hospitalization No Has patient had a PCN reaction occurring within the last 10 years: No If all of the above answers are "NO", then may proceed with Cephalosporin use. Patient states she is not allergic to  penicillin    No medications prior to admission.    No results found for this or any previous visit (from the past 48 hour(s)).  No results found.   Pertinent items are noted in HPI.  Height 5\' 7"  (1.702 m), weight 101.2 kg.  General appearance: alert, cooperative and appears stated age Head: Normocephalic, without obvious abnormality, atraumatic Neck: no JVD Resp: clear to auscultation bilaterally Cardio: regular rate and rhythm, S1, S2 normal, no murmur, click, rub or gallop GI: soft, non-tender; bowel sounds normal; no masses,  no organomegaly Extremities: pain and catching left thumb Pulses: 2+ and symmetric Skin: Skin color, texture, turgor normal. No rashes or lesions Neurologic: Grossly normal Incision/Wound: na  Assessment/Plan  Assessment:  1. Trigger finger of left thumb  2. Primary osteoarthritis of both first carpometacarpal joints    Plan: Have recommended release of the A1 pulley of her left thumb. We have discussed the possibility of treatment of the [facility] arthritis at the same time. She would like to have that done. Pre-peri-and postoperative course are discussed along with risk complications. She is aware there is no guarantee to the surgery the possibility of infection recurrence injury to arteries nerves tendons complete relief symptoms and dystrophy. She is scheduled for suspension plasty with tight rope reconstruction of her left thumb with trapezial excision tendon transfer release of the A1 pulley of the left thumb metacarpal phalangeal joint as an outpatient under regional anesthesia. Questions were encouraged and answered to her satisfaction.   Daryll Brod 05/23/2018, 5:48 AM

## 2018-05-23 NOTE — Discharge Instructions (Addendum)

## 2018-05-23 NOTE — Anesthesia Preprocedure Evaluation (Addendum)
Anesthesia Evaluation  Patient identified by MRN, date of birth, ID band Patient awake    Reviewed: Allergy & Precautions, NPO status , Patient's Chart, lab work & pertinent test results  History of Anesthesia Complications (+) PONV and history of anesthetic complications  Airway Mallampati: II  TM Distance: >3 FB Neck ROM: Full    Dental no notable dental hx.    Pulmonary neg pulmonary ROS,    Pulmonary exam normal        Cardiovascular hypertension, Pt. on medications Normal cardiovascular exam     Neuro/Psych negative neurological ROS  negative psych ROS   GI/Hepatic negative GI ROS, Neg liver ROS,   Endo/Other  diabetes, Insulin Dependent, Oral Hypoglycemic AgentsHypothyroidism   Renal/GU negative Renal ROS  negative genitourinary   Musculoskeletal negative musculoskeletal ROS (+)   Abdominal (+) + obese,   Peds  Hematology negative hematology ROS (+)   Anesthesia Other Findings   Reproductive/Obstetrics                            Anesthesia Physical Anesthesia Plan  ASA: III  Anesthesia Plan: Regional   Post-op Pain Management:  Regional for Post-op pain   Induction:   PONV Risk Score and Plan: 3 and Propofol infusion, Ondansetron, Treatment may vary due to age or medical condition and Scopolamine patch - Pre-op  Airway Management Planned: Natural Airway, Nasal Cannula and Simple Face Mask  Additional Equipment: None  Intra-op Plan:   Post-operative Plan:   Informed Consent: I have reviewed the patients History and Physical, chart, labs and discussed the procedure including the risks, benefits and alternatives for the proposed anesthesia with the patient or authorized representative who has indicated his/her understanding and acceptance.     Plan Discussed with:   Anesthesia Plan Comments: (Patient DENIES allergies to penicillin and sulfas. These have been removed  from her allergy list. Her only allergy is chocolate.)       Anesthesia Quick Evaluation

## 2018-05-23 NOTE — Anesthesia Procedure Notes (Signed)
Anesthesia Regional Block: Supraclavicular block   Pre-Anesthetic Checklist: ,, timeout performed, Correct Patient, Correct Site, Correct Laterality, Correct Procedure, Correct Position, site marked, Risks and benefits discussed,  Surgical consent,  Pre-op evaluation,  At surgeon's request and post-op pain management  Laterality: Left  Prep: chloraprep       Needles:  Injection technique: Single-shot  Needle Type: Echogenic Stimulator Needle     Needle Length: 9cm  Needle Gauge: 21     Additional Needles:   Procedures:,,,, ultrasound used (permanent image in chart),,,,  Narrative:  Start time: 05/23/2018 7:55 AM End time: 05/23/2018 8:03 AM Injection made incrementally with aspirations every 5 mL.  Performed by: Personally  Anesthesiologist: Lidia Collum, MD  Additional Notes: Monitors applied. Injection made in 5cc increments. No resistance to injection. Good needle visualization. Patient tolerated procedure well.

## 2018-05-23 NOTE — Transfer of Care (Signed)
Immediate Anesthesia Transfer of Care Note  Patient: LATRICIA CERRITO  Procedure(s) Performed: RELEASE TRIGGER FINGER/A-1 PULLEY LEFT THUMB (Left Thumb) CARPOMETACARPEL (CMC) SUSPENSION PLASTY LEFT THUMB (Left Thumb) TENDON TRANSFER WITH TRAPEZIUM EXCISION (Left Thumb)  Patient Location: PACU  Anesthesia Type:MAC combined with regional for post-op pain  Level of Consciousness: awake, alert  and oriented  Airway & Oxygen Therapy: Patient Spontanous Breathing  Post-op Assessment: Report given to RN and Post -op Vital signs reviewed and stable  Post vital signs: Reviewed and stable  Last Vitals:  Vitals Value Taken Time  BP    Temp    Pulse    Resp    SpO2      Last Pain:  Vitals:   05/23/18 0703  TempSrc: Oral  PainSc: 5       Patients Stated Pain Goal: 2 (35/70/17 [id number])  Complications: No apparent anesthesia complications

## 2018-05-23 NOTE — Op Note (Signed)
NAME: Nancy [name] MEDICAL RECORD NO: [id number] DATE OF BIRTH: 1965/10/30 FACILITY: [facility] LOCATION: [facility] PHYSICIAN: Wynonia Sours, MD   OPERATIVE REPORT   DATE OF PROCEDURE: 05/23/18    PREOPERATIVE DIAGNOSIS:   Triggering left thumb MCP arthritis left thumb   POSTOPERATIVE DIAGNOSIS:   Same   PROCEDURE:   Release A1 pulley left thumb special plasty with micro link suture.   SURGEON: Daryll Brod, M.D.   ASSISTANT: Leanora Cover, MD   ANESTHESIA:  Regional with sedation   INTRAVENOUS FLUIDS:  Per anesthesia flow sheet.   ESTIMATED BLOOD LOSS:  Minimal.   COMPLICATIONS:  None.   SPECIMENS:  none   TOURNIQUET TIME:    Total Tourniquet Time Documented: Upper Arm (Left) - 52 minutes Total: Upper Arm (Left) - 52 minutes    DISPOSITION:  Stable to PACU.   INDICATIONS: Patient is a 52 year old female with a history of pain at the basal joint of her thumb triggering of her left thumb.  This is not responded to conservative treatment including multiple injections.  Anti-inflammatory splinting.  Pre-peri-and postoperative course been discussed along with risk complications.  She is aware there is no guarantee to the surgery the possibility of infection recurrence injury to arteries nerves tendons complete relief symptoms dystrophy.  She has elected to undergo [facility] arthroplasty with release of the A1 pulley of the left thumb.  OPERATIVE COURSE: Patient is brought to the operating room after a supraclavicular block was carried out without difficulty in the preoperative area under the direction the anesthesia department.  She was prepped using ChloraPrep in the supine position with left arm free.  A three-minute dry time was allowed timeout taken to confirm patient procedure.  The A1 pulley was identified.  The neurovascular structures were identified and protected.  The A1 pulley was then released on its radial aspect taking care to protect the oblique pulley.   Tenosynovial tissue proximally was separated with blunt dissection.  Finger placed through full range of motion no further triggering was noted an area compression of the tendon was identified.  The skin was closed interrupted 4-0 nylon sutures after irrigation.  A curvilinear incision was made for excision of the trapezium and suspension plasty with tendon transfer.  This was carried down through subcutaneous tissue.  This was based over the base of the thumb metacarpal and along the first dorsal extensor compartment.  The radial nerve was identified protected the interval between the abductor pollicis August and extensor pollicis brevis was incised.  The radial artery was identified proximally and protected.  An incision was then made through the periosteum of the trapezium to the level of the STT joint and CMC joint.  This was then elevated off the bone.  The bone was then isolated with a blunt sharp dissection.  This was then removed in toto.  A flexor carpi radialis tendon graft was then harvested after placing traction on the thumb a retractor under the FPL this was brought in one half was taken this was dissected distal and left attached to the base of the index metacarpal.  Sutures were placed in this using 3-0 Ethibond sutures.  Is a modified Kessler and Bennell type suture.  Was set aside.  A micro-length was then inserted after opening the periosteum at the base of the thumb metacarpal.  The pin was then driven across the base of the thumb and across the base of the index metacarpal and exited through the dorsal ulnar  aspect of the metacarpal.  X-rays confirmed positioning.  An incision was then made over the pin this allowed the micro length to be placed and tied with traction on the thumb to maintain its position.  X-rays confirmed positioning with adequate suspension.  The wounds were irrigated and closed dorsally after suturing the micro length in position.  The left carpi radialis was then passed  through the abductor pollicis longus longus at its insertion at the base of the metacarpal and sutured maintaining a sling under the base of the metacarpal.  The thumb was able to be placed through full range of motion.  The interval of the trapezium was irrigated the subcutaneous tissue and capsule closed with interrupted 4-0 Vicryl and the skin with interrupted 4-0 nylon sutures.  A sterile compressive dressing and thumb spica splint was applied.  Deflation of the tourniquet all fingers immediately pink.  She was taken to the recovery room for observation in satisfactory condition.  She will be discharged home to return Kennith Gain agrees were in 1 week on Percocet.  Daryll Brod, MD Electronically signed, 05/23/18

## 2018-05-23 NOTE — Progress Notes (Signed)
Assisted Dr. Witman with left, ultrasound guided, supraclavicular block. Side rails up, monitors on throughout procedure. See vital signs in flow sheet. Tolerated Procedure well. °

## 2018-05-23 NOTE — Brief Op Note (Signed)
05/23/2018  9:55 AM  PATIENT:  Nancy [name]  52 y.o. female  PRE-OPERATIVE DIAGNOSIS:  Trigger left thumb  Carpometacarpal arthritis left thumb  POST-OPERATIVE DIAGNOSIS:  Trigger left thumb  Carpometacarpal arthritis left thumb  PROCEDURE:  Procedure(s): RELEASE TRIGGER FINGER/A-1 PULLEY LEFT THUMB (Left) CARPOMETACARPEL (CMC) SUSPENSION PLASTY LEFT THUMB (Left) TENDON TRANSFER WITH TRAPEZIUM EXCISION (Left)  SURGEON:  Surgeon(s) and Role:    * Daryll Brod, MD - Primary    * Leanora Cover, MD - Assisting  PHYSICIAN ASSISTANT:   ASSISTANTS: Richardo Priest,.MD   ANESTHESIA:   regional and IV sedation  EBL: 17mlBLOOD ADMINISTERED:none  DRAINS: none   LOCAL MEDICATIONS USED:  NONE  SPECIMEN:  No Specimen  DISPOSITION OF SPECIMEN:  N/A  COUNTS:  YES  TOURNIQUET:   Total Tourniquet Time Documented: Upper Arm (Left) - 52 minutes Total: Upper Arm (Left) - 52 minutes   DICTATION: .Viviann Spare Dictation  PLAN OF CARE: Discharge to home after PACU  PATIENT DISPOSITION:  PACU - hemodynamically stable.

## 2018-05-23 NOTE — Op Note (Signed)
I assisted Surgeon(s) and Role:    * Daryll Brod, MD - Primary    * Leanora Cover, MD - Assisting on the Procedure(s): RELEASE TRIGGER FINGER/A-1 PULLEY LEFT THUMB CARPOMETACARPEL ([place]) SUSPENSION PLASTY LEFT THUMB TENDON TRANSFER WITH TRAPEZIUM EXCISION on 05/23/2018.  I provided assistance on this case as follows: retraction soft tissues, placement anchor, closure wound.  Electronically signed by: Leanora Cover, MD Date: 05/23/2018 Time: 9:52 AM

## 2018-05-24 ENCOUNTER — Encounter (HOSPITAL_BASED_OUTPATIENT_CLINIC_OR_DEPARTMENT_OTHER): Payer: Self-pay | Admitting: Orthopedic Surgery

## 2018-05-29 DIAGNOSIS — M180 Bilateral primary osteoarthritis of first carpometacarpal joints: Secondary | ICD-10-CM | POA: Diagnosis not present

## 2018-06-19 DIAGNOSIS — M6281 Muscle weakness (generalized): Secondary | ICD-10-CM | POA: Diagnosis not present

## 2018-06-19 DIAGNOSIS — M25632 Stiffness of left wrist, not elsewhere classified: Secondary | ICD-10-CM | POA: Diagnosis not present

## 2018-06-19 DIAGNOSIS — M25532 Pain in left wrist: Secondary | ICD-10-CM | POA: Diagnosis not present

## 2018-06-19 DIAGNOSIS — M25642 Stiffness of left hand, not elsewhere classified: Secondary | ICD-10-CM | POA: Diagnosis not present

## 2018-06-20 DIAGNOSIS — M25642 Stiffness of left hand, not elsewhere classified: Secondary | ICD-10-CM | POA: Diagnosis not present

## 2018-06-20 DIAGNOSIS — M6281 Muscle weakness (generalized): Secondary | ICD-10-CM | POA: Diagnosis not present

## 2018-06-20 DIAGNOSIS — M25632 Stiffness of left wrist, not elsewhere classified: Secondary | ICD-10-CM | POA: Diagnosis not present

## 2018-06-20 DIAGNOSIS — M25532 Pain in left wrist: Secondary | ICD-10-CM | POA: Diagnosis not present

## 2018-06-24 DIAGNOSIS — M25642 Stiffness of left hand, not elsewhere classified: Secondary | ICD-10-CM | POA: Diagnosis not present

## 2018-06-24 DIAGNOSIS — M25632 Stiffness of left wrist, not elsewhere classified: Secondary | ICD-10-CM | POA: Diagnosis not present

## 2018-06-24 DIAGNOSIS — M6281 Muscle weakness (generalized): Secondary | ICD-10-CM | POA: Diagnosis not present

## 2018-06-24 DIAGNOSIS — M25532 Pain in left wrist: Secondary | ICD-10-CM | POA: Diagnosis not present

## 2018-06-27 DIAGNOSIS — M25532 Pain in left wrist: Secondary | ICD-10-CM | POA: Diagnosis not present

## 2018-06-27 DIAGNOSIS — M6281 Muscle weakness (generalized): Secondary | ICD-10-CM | POA: Diagnosis not present

## 2018-06-27 DIAGNOSIS — M25632 Stiffness of left wrist, not elsewhere classified: Secondary | ICD-10-CM | POA: Diagnosis not present

## 2018-06-27 DIAGNOSIS — M25642 Stiffness of left hand, not elsewhere classified: Secondary | ICD-10-CM | POA: Diagnosis not present

## 2018-07-01 DIAGNOSIS — M25532 Pain in left wrist: Secondary | ICD-10-CM | POA: Diagnosis not present

## 2018-07-01 DIAGNOSIS — M6281 Muscle weakness (generalized): Secondary | ICD-10-CM | POA: Diagnosis not present

## 2018-07-01 DIAGNOSIS — M25632 Stiffness of left wrist, not elsewhere classified: Secondary | ICD-10-CM | POA: Diagnosis not present

## 2018-07-01 DIAGNOSIS — M25642 Stiffness of left hand, not elsewhere classified: Secondary | ICD-10-CM | POA: Diagnosis not present

## 2018-07-04 DIAGNOSIS — E1139 Type 2 diabetes mellitus with other diabetic ophthalmic complication: Secondary | ICD-10-CM | POA: Diagnosis not present

## 2018-07-04 DIAGNOSIS — M6281 Muscle weakness (generalized): Secondary | ICD-10-CM | POA: Diagnosis not present

## 2018-07-04 DIAGNOSIS — M25642 Stiffness of left hand, not elsewhere classified: Secondary | ICD-10-CM | POA: Diagnosis not present

## 2018-07-04 DIAGNOSIS — M25632 Stiffness of left wrist, not elsewhere classified: Secondary | ICD-10-CM | POA: Diagnosis not present

## 2018-07-04 DIAGNOSIS — M25532 Pain in left wrist: Secondary | ICD-10-CM | POA: Diagnosis not present

## 2018-07-08 DIAGNOSIS — M25642 Stiffness of left hand, not elsewhere classified: Secondary | ICD-10-CM | POA: Diagnosis not present

## 2018-07-08 DIAGNOSIS — M25532 Pain in left wrist: Secondary | ICD-10-CM | POA: Diagnosis not present

## 2018-07-08 DIAGNOSIS — M6281 Muscle weakness (generalized): Secondary | ICD-10-CM | POA: Diagnosis not present

## 2018-07-08 DIAGNOSIS — M25632 Stiffness of left wrist, not elsewhere classified: Secondary | ICD-10-CM | POA: Diagnosis not present

## 2018-07-10 DIAGNOSIS — M180 Bilateral primary osteoarthritis of first carpometacarpal joints: Secondary | ICD-10-CM | POA: Diagnosis not present

## 2018-07-10 DIAGNOSIS — M65312 Trigger thumb, left thumb: Secondary | ICD-10-CM | POA: Diagnosis not present

## 2018-07-11 DIAGNOSIS — M6281 Muscle weakness (generalized): Secondary | ICD-10-CM | POA: Diagnosis not present

## 2018-07-11 DIAGNOSIS — M25632 Stiffness of left wrist, not elsewhere classified: Secondary | ICD-10-CM | POA: Diagnosis not present

## 2018-07-11 DIAGNOSIS — E11319 Type 2 diabetes mellitus with unspecified diabetic retinopathy without macular edema: Secondary | ICD-10-CM | POA: Diagnosis not present

## 2018-07-11 DIAGNOSIS — M25642 Stiffness of left hand, not elsewhere classified: Secondary | ICD-10-CM | POA: Diagnosis not present

## 2018-07-11 DIAGNOSIS — K863 Pseudocyst of pancreas: Secondary | ICD-10-CM | POA: Diagnosis not present

## 2018-07-11 DIAGNOSIS — M25532 Pain in left wrist: Secondary | ICD-10-CM | POA: Diagnosis not present

## 2018-07-11 DIAGNOSIS — Z23 Encounter for immunization: Secondary | ICD-10-CM | POA: Diagnosis not present

## 2018-07-15 DIAGNOSIS — M25642 Stiffness of left hand, not elsewhere classified: Secondary | ICD-10-CM | POA: Diagnosis not present

## 2018-07-15 DIAGNOSIS — M25632 Stiffness of left wrist, not elsewhere classified: Secondary | ICD-10-CM | POA: Diagnosis not present

## 2018-07-15 DIAGNOSIS — M6281 Muscle weakness (generalized): Secondary | ICD-10-CM | POA: Diagnosis not present

## 2018-07-15 DIAGNOSIS — M25532 Pain in left wrist: Secondary | ICD-10-CM | POA: Diagnosis not present

## 2018-07-17 DIAGNOSIS — M6281 Muscle weakness (generalized): Secondary | ICD-10-CM | POA: Diagnosis not present

## 2018-07-17 DIAGNOSIS — M25642 Stiffness of left hand, not elsewhere classified: Secondary | ICD-10-CM | POA: Diagnosis not present

## 2018-07-17 DIAGNOSIS — M25532 Pain in left wrist: Secondary | ICD-10-CM | POA: Diagnosis not present

## 2018-07-17 DIAGNOSIS — M25632 Stiffness of left wrist, not elsewhere classified: Secondary | ICD-10-CM | POA: Diagnosis not present

## 2018-07-22 DIAGNOSIS — M25532 Pain in left wrist: Secondary | ICD-10-CM | POA: Diagnosis not present

## 2018-07-22 DIAGNOSIS — M25642 Stiffness of left hand, not elsewhere classified: Secondary | ICD-10-CM | POA: Diagnosis not present

## 2018-07-22 DIAGNOSIS — M25632 Stiffness of left wrist, not elsewhere classified: Secondary | ICD-10-CM | POA: Diagnosis not present

## 2018-07-22 DIAGNOSIS — M6281 Muscle weakness (generalized): Secondary | ICD-10-CM | POA: Diagnosis not present

## 2018-07-24 DIAGNOSIS — M6281 Muscle weakness (generalized): Secondary | ICD-10-CM | POA: Diagnosis not present

## 2018-07-24 DIAGNOSIS — M25532 Pain in left wrist: Secondary | ICD-10-CM | POA: Diagnosis not present

## 2018-07-24 DIAGNOSIS — M25632 Stiffness of left wrist, not elsewhere classified: Secondary | ICD-10-CM | POA: Diagnosis not present

## 2018-07-24 DIAGNOSIS — M25642 Stiffness of left hand, not elsewhere classified: Secondary | ICD-10-CM | POA: Diagnosis not present

## 2018-07-29 DIAGNOSIS — M6281 Muscle weakness (generalized): Secondary | ICD-10-CM | POA: Diagnosis not present

## 2018-07-29 DIAGNOSIS — M25632 Stiffness of left wrist, not elsewhere classified: Secondary | ICD-10-CM | POA: Diagnosis not present

## 2018-07-29 DIAGNOSIS — M25532 Pain in left wrist: Secondary | ICD-10-CM | POA: Diagnosis not present

## 2018-07-29 DIAGNOSIS — M25642 Stiffness of left hand, not elsewhere classified: Secondary | ICD-10-CM | POA: Diagnosis not present

## 2018-07-31 DIAGNOSIS — M25632 Stiffness of left wrist, not elsewhere classified: Secondary | ICD-10-CM | POA: Diagnosis not present

## 2018-07-31 DIAGNOSIS — M25532 Pain in left wrist: Secondary | ICD-10-CM | POA: Diagnosis not present

## 2018-07-31 DIAGNOSIS — M25642 Stiffness of left hand, not elsewhere classified: Secondary | ICD-10-CM | POA: Diagnosis not present

## 2018-07-31 DIAGNOSIS — M6281 Muscle weakness (generalized): Secondary | ICD-10-CM | POA: Diagnosis not present

## 2018-09-20 ENCOUNTER — Other Ambulatory Visit: Payer: Self-pay

## 2018-09-20 ENCOUNTER — Encounter (HOSPITAL_BASED_OUTPATIENT_CLINIC_OR_DEPARTMENT_OTHER): Payer: Self-pay | Admitting: *Deleted

## 2018-09-20 NOTE — Progress Notes (Signed)
Pt is planning to come Tuesday for BMET. Bring all medications.

## 2018-09-23 ENCOUNTER — Encounter (HOSPITAL_BASED_OUTPATIENT_CLINIC_OR_DEPARTMENT_OTHER)
Admission: RE | Admit: 2018-09-23 | Discharge: 2018-09-23 | Disposition: A | Discharge status: Home / Self Care | Payer: Medicare Other | Source: Ambulatory Visit | Attending: Orthopedic Surgery | Admitting: Orthopedic Surgery

## 2018-09-23 DIAGNOSIS — Z01812 Encounter for preprocedural laboratory examination: Secondary | ICD-10-CM | POA: Diagnosis not present

## 2018-09-23 LAB — BASIC METABOLIC PANEL
Anion gap: 14 (ref 5–15)
BUN: 11 mg/dL (ref 6–20)
CO2: 23 mmol/L (ref 22–32)
Calcium: 9.3 mg/dL (ref 8.9–10.3)
Chloride: 103 mmol/L (ref 98–111)
Creatinine, Ser: 0.67 mg/dL (ref 0.44–1.00)
GFR calc Af Amer: >60 mL/min (ref >60)
GFR calc non Af Amer: >60 mL/min (ref >60)
Glucose, Bld: 186 mg/dL — ABNORMAL HIGH (ref 70–99)
Potassium: 4.3 mmol/L (ref 3.5–5.1)
Sodium: 140 mmol/L (ref 135–145)

## 2018-09-30 ENCOUNTER — Other Ambulatory Visit: Payer: Self-pay | Admitting: Orthopedic Surgery

## 2018-10-03 ENCOUNTER — Ambulatory Visit (HOSPITAL_BASED_OUTPATIENT_CLINIC_OR_DEPARTMENT_OTHER): Payer: Medicare Other | Admitting: Anesthesiology

## 2018-10-03 ENCOUNTER — Encounter (HOSPITAL_BASED_OUTPATIENT_CLINIC_OR_DEPARTMENT_OTHER): Payer: Self-pay

## 2018-10-03 ENCOUNTER — Encounter (HOSPITAL_BASED_OUTPATIENT_CLINIC_OR_DEPARTMENT_OTHER)
Admission: RE | Disposition: A | Discharge status: Home / Self Care | Payer: Self-pay | Source: Home / Self Care | Attending: Orthopedic Surgery

## 2018-10-03 ENCOUNTER — Other Ambulatory Visit: Payer: Self-pay

## 2018-10-03 ENCOUNTER — Ambulatory Visit (HOSPITAL_BASED_OUTPATIENT_CLINIC_OR_DEPARTMENT_OTHER)
Admission: RE | Admit: 2018-10-03 | Discharge: 2018-10-03 | Disposition: A | Discharge status: Home / Self Care | Payer: Medicare Other | Attending: Orthopedic Surgery | Admitting: Orthopedic Surgery

## 2018-10-03 DIAGNOSIS — Z79899 Other long term (current) drug therapy: Secondary | ICD-10-CM | POA: Diagnosis not present

## 2018-10-03 DIAGNOSIS — E876 Hypokalemia: Secondary | ICD-10-CM | POA: Diagnosis not present

## 2018-10-03 DIAGNOSIS — Z6837 Body mass index (BMI) 37.0-37.9, adult: Secondary | ICD-10-CM | POA: Diagnosis not present

## 2018-10-03 DIAGNOSIS — I10 Essential (primary) hypertension: Secondary | ICD-10-CM | POA: Insufficient documentation

## 2018-10-03 DIAGNOSIS — E039 Hypothyroidism, unspecified: Secondary | ICD-10-CM | POA: Insufficient documentation

## 2018-10-03 DIAGNOSIS — M1811 Unilateral primary osteoarthritis of first carpometacarpal joint, right hand: Secondary | ICD-10-CM | POA: Diagnosis not present

## 2018-10-03 DIAGNOSIS — Z794 Long term (current) use of insulin: Secondary | ICD-10-CM | POA: Diagnosis not present

## 2018-10-03 DIAGNOSIS — M189 Osteoarthritis of first carpometacarpal joint, unspecified: Secondary | ICD-10-CM | POA: Diagnosis not present

## 2018-10-03 DIAGNOSIS — E119 Type 2 diabetes mellitus without complications: Secondary | ICD-10-CM | POA: Insufficient documentation

## 2018-10-03 DIAGNOSIS — Z7982 Long term (current) use of aspirin: Secondary | ICD-10-CM | POA: Insufficient documentation

## 2018-10-03 HISTORY — DX: Essential (primary) hypertension: I10

## 2018-10-03 HISTORY — PX: CARPOMETACARPEL SUSPENSION PLASTY: SHX5005

## 2018-10-03 HISTORY — DX: Hypothyroidism, unspecified: E03.9

## 2018-10-03 LAB — GLUCOSE, CAPILLARY
Glucose-Capillary: 199 mg/dL — ABNORMAL HIGH (ref 70–99)
Glucose-Capillary: 184 mg/dL — ABNORMAL HIGH (ref 70–99)

## 2018-10-03 SURGERY — CARPOMETACARPEL (CMC) SUSPENSION PLASTY
Anesthesia: Monitor Anesthesia Care | Site: Wrist | Laterality: Right

## 2018-10-03 MED ORDER — FENTANYL CITRATE (PF) 100 MCG/2ML IJ SOLN
INTRAMUSCULAR | Status: AC
  Filled 2018-10-03: qty 2

## 2018-10-03 MED ORDER — LACTATED RINGERS IV SOLN
INTRAVENOUS | Status: DC
  Administered 2018-10-03: 07:00:00 via INTRAVENOUS

## 2018-10-03 MED ORDER — BUPIVACAINE HCL (PF) 0.5 % IJ SOLN
INTRAMUSCULAR | Status: AC
  Filled 2018-10-03: qty 30

## 2018-10-03 MED ORDER — MIDAZOLAM HCL 2 MG/2ML IJ SOLN
INTRAMUSCULAR | Status: AC
  Filled 2018-10-03: qty 2

## 2018-10-03 MED ORDER — BUPIVACAINE-EPINEPHRINE (PF) 0.5% -1:200000 IJ SOLN
INTRAMUSCULAR | Status: DC | PRN
  Administered 2018-10-03: 30 mL via PERINEURAL

## 2018-10-03 MED ORDER — FENTANYL CITRATE (PF) 100 MCG/2ML IJ SOLN
50.0000 ug | INTRAMUSCULAR | Status: DC | PRN
  Administered 2018-10-03: 50 ug via INTRAVENOUS

## 2018-10-03 MED ORDER — EPINEPHRINE 30 MG/30ML IJ SOLN
INTRAMUSCULAR | Status: AC
  Filled 2018-10-03: qty 1

## 2018-10-03 MED ORDER — TRAMADOL HCL 50 MG PO TABS: 50.0000 mg | ORAL_TABLET | Freq: Four times a day (QID) | ORAL | 0 refills | Status: DC | PRN

## 2018-10-03 MED ORDER — FENTANYL CITRATE (PF) 100 MCG/2ML IJ SOLN: 25.0000 ug | INTRAMUSCULAR | Status: DC | PRN

## 2018-10-03 MED ORDER — SCOPOLAMINE 1 MG/3DAYS TD PT72
MEDICATED_PATCH | TRANSDERMAL | Status: AC
  Filled 2018-10-03: qty 1

## 2018-10-03 MED ORDER — CHLORHEXIDINE GLUCONATE 4 % EX LIQD: 60.0000 mL | Freq: Once | CUTANEOUS | Status: DC

## 2018-10-03 MED ORDER — ONDANSETRON HCL 4 MG/2ML IJ SOLN
INTRAMUSCULAR | Status: DC | PRN
  Administered 2018-10-03: 4 mg via INTRAVENOUS

## 2018-10-03 MED ORDER — PROPOFOL 10 MG/ML IV BOLUS
INTRAVENOUS | Status: AC
  Filled 2018-10-03: qty 20

## 2018-10-03 MED ORDER — PROPOFOL 10 MG/ML IV BOLUS
INTRAVENOUS | Status: DC | PRN
  Administered 2018-10-03 (×2): 10 mg via INTRAVENOUS

## 2018-10-03 MED ORDER — CEFAZOLIN SODIUM-DEXTROSE 2-4 GM/100ML-% IV SOLN
INTRAVENOUS | Status: AC
  Filled 2018-10-03: qty 100

## 2018-10-03 MED ORDER — PROPOFOL 500 MG/50ML IV EMUL
INTRAVENOUS | Status: AC
  Filled 2018-10-03: qty 50

## 2018-10-03 MED ORDER — CEFAZOLIN SODIUM-DEXTROSE 2-4 GM/100ML-% IV SOLN
2.0000 g | INTRAVENOUS | Status: AC
  Administered 2018-10-03: 2 g via INTRAVENOUS

## 2018-10-03 MED ORDER — BUPIVACAINE HCL (PF) 0.25 % IJ SOLN
INTRAMUSCULAR | Status: AC
  Filled 2018-10-03: qty 90

## 2018-10-03 MED ORDER — PROPOFOL 500 MG/50ML IV EMUL
INTRAVENOUS | Status: DC | PRN
  Administered 2018-10-03: 100 ug/kg/min via INTRAVENOUS

## 2018-10-03 MED ORDER — SCOPOLAMINE 1 MG/3DAYS TD PT72
1.0000 | MEDICATED_PATCH | Freq: Once | TRANSDERMAL | Status: DC | PRN
  Administered 2018-10-03: 1.5 mg via TRANSDERMAL

## 2018-10-03 MED ORDER — MIDAZOLAM HCL 2 MG/2ML IJ SOLN
1.0000 mg | INTRAMUSCULAR | Status: DC | PRN
  Administered 2018-10-03: 2 mg via INTRAVENOUS

## 2018-10-03 MED ORDER — CLONIDINE HCL (ANALGESIA) 100 MCG/ML EP SOLN
EPIDURAL | Status: DC | PRN
  Administered 2018-10-03: 50 ug

## 2018-10-03 SURGICAL SUPPLY — 58 items
BIT DRILL PASSING CMC 1/4 FLEX (BIT) ×1 IMPLANT
BLADE MINI RND TIP GREEN BEAV (BLADE) ×3 IMPLANT
BLADE SURG 15 STRL LF DISP TIS (BLADE) ×1 IMPLANT
BLADE SURG 15 STRL SS (BLADE) ×3
BNDG CMPR 9X4 STRL LF SNTH (GAUZE/BANDAGES/DRESSINGS) ×1
BNDG COHESIVE 3X5 TAN STRL LF (GAUZE/BANDAGES/DRESSINGS) ×3 IMPLANT
BNDG ESMARK 4X9 LF (GAUZE/BANDAGES/DRESSINGS) ×3 IMPLANT
BNDG GAUZE ELAST 4 BULKY (GAUZE/BANDAGES/DRESSINGS) ×3 IMPLANT
BUTTON ALL-SUT W/BACKSTOP (Orthopedic Implant) ×3 IMPLANT
CHLORAPREP W/TINT 26ML (MISCELLANEOUS) ×3 IMPLANT
CORD BIPOLAR FORCEPS 12FT (ELECTRODE) ×3 IMPLANT
COVER BACK TABLE 60X90IN (DRAPES) ×3 IMPLANT
COVER MAYO STAND STRL (DRAPES) ×3 IMPLANT
COVER WAND RF STERILE (DRAPES) IMPLANT
CUFF TOURNIQUET SINGLE 18IN (TOURNIQUET CUFF) ×3 IMPLANT
DECANTER SPIKE VIAL GLASS SM (MISCELLANEOUS) IMPLANT
DRAPE EXTREMITY T 121X128X90 (DISPOSABLE) ×3 IMPLANT
DRAPE OEC MINIVIEW 54X84 (DRAPES) ×3 IMPLANT
DRAPE SURG 17X23 STRL (DRAPES) ×3 IMPLANT
DRILL PASSING CMC 1/4 FLEX (BIT) ×3
GAUZE 4X4 16PLY RFD (DISPOSABLE) IMPLANT
GAUZE SPONGE 4X4 12PLY STRL (GAUZE/BANDAGES/DRESSINGS) ×3 IMPLANT
GAUZE XEROFORM 1X8 LF (GAUZE/BANDAGES/DRESSINGS) ×3 IMPLANT
GLOVE BIOGEL M STRL SZ7.5 (GLOVE) ×3 IMPLANT
GLOVE BIOGEL PI IND STRL 7.0 (GLOVE) ×1 IMPLANT
GLOVE BIOGEL PI IND STRL 8 (GLOVE) ×1 IMPLANT
GLOVE BIOGEL PI IND STRL 8.5 (GLOVE) ×1 IMPLANT
GLOVE BIOGEL PI INDICATOR 7.0 (GLOVE) ×2
GLOVE BIOGEL PI INDICATOR 8 (GLOVE) ×2
GLOVE BIOGEL PI INDICATOR 8.5 (GLOVE) ×2
GLOVE SURG ORTHO 8.0 STRL STRW (GLOVE) ×3 IMPLANT
GOWN STRL REUS W/ TWL LRG LVL3 (GOWN DISPOSABLE) ×1 IMPLANT
GOWN STRL REUS W/TWL LRG LVL3 (GOWN DISPOSABLE) ×3
GOWN STRL REUS W/TWL XL LVL3 (GOWN DISPOSABLE) ×6 IMPLANT
NEEDLE PRECISIONGLIDE 27X1.5 (NEEDLE) IMPLANT
NS IRRIG 1000ML POUR BTL (IV SOLUTION) ×3 IMPLANT
PACK BASIN DAY SURGERY FS (CUSTOM PROCEDURE TRAY) ×3 IMPLANT
PAD CAST 3X4 CTTN HI CHSV (CAST SUPPLIES) ×1 IMPLANT
PADDING CAST ABS 3INX4YD NS (CAST SUPPLIES)
PADDING CAST ABS COTTON 3X4 (CAST SUPPLIES) IMPLANT
PADDING CAST COTTON 3X4 STRL (CAST SUPPLIES) ×3
SLEEVE SCD COMPRESS KNEE MED (MISCELLANEOUS) ×3 IMPLANT
SPLINT PLASTER CAST XFAST 3X15 (CAST SUPPLIES) IMPLANT
SPLINT PLASTER XTRA FASTSET 3X (CAST SUPPLIES)
STOCKINETTE 4X48 STRL (DRAPES) ×3 IMPLANT
SUT ETHIBOND 3-0 V-5 (SUTURE) ×3 IMPLANT
SUT ETHILON 4 0 PS 2 18 (SUTURE) ×3 IMPLANT
SUT FIBERWIRE 4-0 18 DIAM BLUE (SUTURE)
SUT MERSILENE 4 0 P 3 (SUTURE) IMPLANT
SUT STEEL 3 0 (SUTURE) ×3 IMPLANT
SUT VIC AB 4-0 P-3 18XBRD (SUTURE) ×1 IMPLANT
SUT VIC AB 4-0 P2 18 (SUTURE) IMPLANT
SUT VIC AB 4-0 P3 18 (SUTURE) ×3
SUTURE FIBERWR 4-0 18 DIA BLUE (SUTURE) IMPLANT
SYR BULB 3OZ (MISCELLANEOUS) ×3 IMPLANT
SYR CONTROL 10ML LL (SYRINGE) IMPLANT
TOWEL GREEN STERILE FF (TOWEL DISPOSABLE) ×6 IMPLANT
UNDERPAD 30X30 (UNDERPADS AND DIAPERS) ×3 IMPLANT

## 2018-10-03 NOTE — Transfer of Care (Signed)
Immediate Anesthesia Transfer of Care Note  Patient: Nancy [name]  Procedure(s) Performed: RIGHT THUMB SUSPENSONPLASTY WITH MICRO LINK SUTURE AND TRAPEXIUM EXCISION (Right Wrist)  Patient Location: PACU  Anesthesia Type:MAC and Bier block  Level of Consciousness: sedated  Airway & Oxygen Therapy: Patient Spontanous Breathing and Patient connected to face mask oxygen  Post-op Assessment: Report given to RN and Post -op Vital signs reviewed and stable  Post vital signs: Reviewed and stable  Last Vitals:  Vitals Value Taken Time  BP 114/69 10/03/2018  9:28 AM  Temp    Pulse 80 10/03/2018  9:29 AM  Resp 16 10/03/2018  9:29 AM  SpO2 99 % 10/03/2018  9:29 AM  Vitals shown include unvalidated device data.  Last Pain:  Vitals:   10/03/18 0715  TempSrc: Oral  PainSc: 5       Patients Stated Pain Goal: 5 (45/36/46 8032)  Complications: No apparent anesthesia complications

## 2018-10-03 NOTE — Brief Op Note (Signed)
10/03/2018  9:25 AM  PATIENT:  Nancy [name]  53 y.o. female  PRE-OPERATIVE DIAGNOSIS:  RIGHT THUMB CARPOMETACARPAL ARTHRITIS  POST-OPERATIVE DIAGNOSIS:  RIGHT THUMB CARPOMETACARPAL   PROCEDURE:  Procedure(s) with comments: RIGHT THUMB SUSPENSONPLASTY WITH MICRO LINK SUTURE AND TRAPEXIUM EXCISION (Right) - AXILLARY BLOCK  SURGEON:  Surgeon(s) and Role:    * Daryll Brod, MD - Primary    * Leanora Cover, MD  PHYSICIAN ASSISTANT:   ASSISTANTS: Dasnoit PAC   ANESTHESIA:   regional and IV sedation  EBL: 66ml  BLOOD ADMINISTERED:none  DRAINS: none   LOCAL MEDICATIONS USED:  NONE  SPECIMEN:  No Specimen  DISPOSITION OF SPECIMEN:  N/A  COUNTS:  YES  TOURNIQUET:   Total Tourniquet Time Documented: Upper Arm (Right) - 43 minutes Total: Upper Arm (Right) - 43 minutes   DICTATION: .Viviann Spare Dictation  PLAN OF CARE: Discharge to home after PACU  PATIENT DISPOSITION:  PACU - hemodynamically stable.

## 2018-10-03 NOTE — Progress Notes (Signed)
Assisted Dr. Rob Fitzgerald with right, ultrasound guided, axillary block. Side rails up, monitors on throughout procedure. See vital signs in flow sheet. Tolerated Procedure well. 

## 2018-10-03 NOTE — Op Note (Signed)
NAME: Nancy [name] MEDICAL RECORD NO: [id number] DATE OF BIRTH: 09/06/1965 FACILITY: [facility] LOCATION: [facility] PHYSICIAN: Wynonia Sours, MD   OPERATIVE REPORT   DATE OF PROCEDURE: 10/03/18    PREOPERATIVE DIAGNOSIS:   CMC arthritis right thumb   POSTOPERATIVE DIAGNOSIS:   Same   PROCEDURE:   Excision trapezium with flexor carpi radialis tendon transfer LR TI with micro link support right thumb   SURGEON: Daryll Brod, M.D.   ASSISTANT: Leverne Humbles, [facility]   ANESTHESIA:  Regional with sedation   INTRAVENOUS FLUIDS:  Per anesthesia flow sheet.   ESTIMATED BLOOD LOSS:  Minimal.   COMPLICATIONS:  None.   SPECIMENS:  none   TOURNIQUET TIME:    Total Tourniquet Time Documented: Upper Arm (Right) - 43 minutes Total: Upper Arm (Right) - 43 minutes    DISPOSITION:  Stable to PACU.   INDICATIONS: Patient is a 53 year old female with a history of bilateral thumb CMC pain.  She has undergone suspension plasty with micro link support on her left side.  She continues to complain of pain on her right has not responded to conservative treatment and is elected to undergo surgical reconstruction with tendon transfer trapezium excision and micro link support right thumb.  Pre-peri-and postoperative course been discussed along with risks and complications.  She is aware there is no guarantee to the surgery the possibility of infection recurrence injury to arteries nerves tendons complete relief of symptoms and dystrophy.  In the preoperative area the patient is seen the extremity marked by both patient and surgeon antibiotic given  OPERATIVE COURSE: Patient is brought to the operating room after a supraclavicular block was carried out without difficulty in the preoperative area under the direction the anesthesia department.  She was prepped and draped supine position with the right arm free.  A three-minute dry time was allowed and a timeout taken to confirm patient procedure.   The limb was exsanguinated with an Esmarch bandage turn placed high in the arm was inflated to 250 mmHg.  Longitudinal incision was made over the radial aspect slightly dorsal of the base of the thumb along the first dorsal compartment.  This carried down through subcutaneous tissue.  Neuro structures of the distal portion of the radial nerve were identified and protected the radial artery was identified at the STT joint.  The [facility] and STT joints were isolated and the incision made through the periosteum this was then elevated off from the trapezium.  With this was done with blunt sharp dissection protecting the radial nerve radial artery.  The trapezium was then removed with a rondure it was unable to be removed in 1 piece.  The flex carpi radialis was then isolated the base of the wound.  This was I retracted distally with flexion of the wrist and one half was isolated transected proximally and the left of attached distally to the base of the index metacarpal.  This was then stripped distally to the base of the metacarpal.  The area just distal to the insertion of the abductor pollicis longus was then isolated down to the periosteum and bone of the thumb metacarpal a micro link guide wire was then I inserted through the base of the thumb and through the base of the index metacarpal exiting on the dorsal ulnar aspect of the index metacarpal.  This was confirmed in position with image intensification.  A micro link was then passed through this incision made over the exit of the guidepin  on the index metacarpal down to the metacarpal the micro link suture was then passed a right angle retractor was placed at the base of the thumb index metacarpals and the suture firmly sutured down to the ulnar aspect of the index metacarpal.  Higher to tying the knots and the micro link x-rays AP lateral oblique confirm positioning of the pin in good position.  The this allowed to full mobility of the thumb CMC joint the wound was  irrigated and the index metacarpal and closed interrupted 4-0 nylon sutures.  The [facility] excision area was then irrigated the flexor carpi radialis was then woven through the abductor pollicis longus at its insertion sutured into position with 3-0 Ethibond sutures.  This was brought back onto itself sutured to itself and then to the flexor carpi radialis tendon more volarly.  This was done with the figure-of-eight 3-0 Ethibond sutures.  X-rays were again taken in multiple directions there was no proximal migration of the thumb metacarpal and the space was adequately maintained between the base of the thumb metacarpal and the distal scaphoid.  There was no impingement on the second metacarpal.  The capsule was closed much as possible with interrupted 4-0 Vicryl.  The subcutaneous tissue was closed with interrupted 4-0 Vicryl and skin with interrupted 4-0 nylon sutures.  A dorsal palmar thumb spica splint was applied.  Inflation of the return tourniquet all fingers immediately pink.  She was taken to the recovery room for observation in satisfactory condition.  She will be discharged home to return hand center [facility] in 1 week on Tylenol with ibuprofen and Ultram for a backup.   Daryll Brod, MD Electronically signed, 10/03/18

## 2018-10-03 NOTE — Anesthesia Preprocedure Evaluation (Signed)
Anesthesia Evaluation  Patient identified by MRN, date of birth, ID band Patient awake    Reviewed: Allergy & Precautions, NPO status   History of Anesthesia Complications (+) PONV  Airway Mallampati: II  TM Distance: >3 FB Neck ROM: Full    Dental  (+) Dental Advisory Given   Pulmonary neg pulmonary ROS,    breath sounds clear to auscultation       Cardiovascular hypertension, Pt. on medications  Rhythm:Regular Rate:Normal     Neuro/Psych negative neurological ROS     GI/Hepatic negative GI ROS, Neg liver ROS,   Endo/Other  diabetes, Type 2, Insulin DependentHypothyroidism Morbid obesity  Renal/GU negative Renal ROS     Musculoskeletal   Abdominal   Peds  Hematology negative hematology ROS (+)   Anesthesia Other Findings   Reproductive/Obstetrics                             Lab Results  Component Value Date   WBC 8.3 11/04/2016   HGB 16.1 (H) 11/04/2016   HCT 46.8 (H) 11/04/2016   MCV 90.5 11/04/2016   PLT 204 11/04/2016   Lab Results  Component Value Date   CREATININE 0.67 09/23/2018   BUN 11 09/23/2018   NA 140 09/23/2018   K 4.3 09/23/2018   CL 103 09/23/2018   CO2 23 09/23/2018    Anesthesia Physical Anesthesia Plan  ASA: II  Anesthesia Plan: Regional and MAC   Post-op Pain Management:    Induction: Intravenous  PONV Risk Score and Plan: 3 and Propofol infusion, Midazolam, Ondansetron and Treatment may vary due to age or medical condition  Airway Management Planned: Simple Face Mask and Natural Airway  Additional Equipment:   Intra-op Plan:   Post-operative Plan:   Informed Consent: I have reviewed the patients History and Physical, chart, labs and discussed the procedure including the risks, benefits and alternatives for the proposed anesthesia with the patient or authorized representative who has indicated his/her understanding and acceptance.        Plan Discussed with: CRNA  Anesthesia Plan Comments:         Anesthesia Quick Evaluation

## 2018-10-03 NOTE — Op Note (Signed)
X-ray note.  Multiple views with image intensification of the area of surgery.  Revealed a removal of the trapezium with adequate suspension of the thumb from the index metacarpal with no impingement on the index metacarpal and adequate space between the base of the thumb metacarpal and distal scaphoid.

## 2018-10-03 NOTE — Anesthesia Postprocedure Evaluation (Signed)
Anesthesia Post Note  Patient: Nancy [name]  Procedure(s) Performed: RIGHT THUMB SUSPENSONPLASTY WITH MICRO LINK SUTURE AND TRAPEXIUM EXCISION (Right Wrist)     Patient location during evaluation: PACU Anesthesia Type: Regional Level of consciousness: awake and alert Pain management: pain level controlled Vital Signs Assessment: post-procedure vital signs reviewed and stable Respiratory status: spontaneous breathing, nonlabored ventilation and respiratory function stable Cardiovascular status: blood pressure returned to baseline and stable Postop Assessment: no apparent nausea or vomiting Anesthetic complications: no    Last Vitals:  Vitals:   10/03/18 1000 10/03/18 1015  BP: (!) 141/71 (!) 143/80  Pulse: 69 69  Resp: 18 20  Temp:  37 C  SpO2: 97% 98%    Last Pain:  Vitals:   10/03/18 1015  TempSrc:   PainSc: 0-No pain                 Lynda Rainwater

## 2018-10-03 NOTE — Anesthesia Procedure Notes (Signed)
Anesthesia Regional Block: Axillary brachial plexus block   Pre-Anesthetic Checklist: ,, timeout performed, Correct Patient, Correct Site, Correct Laterality, Correct Procedure, Correct Position, site marked, Risks and benefits discussed,  Surgical consent,  Pre-op evaluation,  At surgeon's request and post-op pain management  Laterality: Right  Prep: chloraprep       Needles:  Injection technique: Single-shot  Needle Type: Echogenic Stimulator Needle     Needle Length: 9cm  Needle Gauge: 21     Additional Needles:   Procedures:, nerve stimulator,,, ultrasound used (permanent image in chart),,,,   Nerve Stimulator or Paresthesia:  Response: MC, Median, radial and ulnar responses elicited, 0.5 mA,   Additional Responses:   Narrative:  Start time: 10/03/2018 7:55 AM End time: 10/03/2018 8:03 AM Injection made incrementally with aspirations every 5 mL.  Performed by: Personally  Anesthesiologist: Suzette Battiest, MD

## 2018-10-03 NOTE — H&P (Signed)
Nancy [name] is an 53 y.o. female.   Chief Complaint:right thumb pain HPI: She is 55 right-handed has not been seen in 20 years. She is status post bilateral carpal tunnel release by myself in 1999.  She states she is having some discomfort in her whole hand is but has a difficult time describing it. She states it is relatively constant. The catching of her thumb produces of a pain with attempting to strain with a VAS score 10/10. Otherwise she is complaining of a throbbing type pain. She has not had any treatment for this is Dr. Willey Blade referred her and placed her on Advil which has given her minimal relief. She states heat helps. She has a history of diabetes thyroid problems no history of arthritis or gout. Family history is positive diabetes thyroid problems negative for arthritis and gout.She is  post surgery for her trigger finger release left thumb and suspension plasty with micro link left CMC.  She is very pleased with that she is not complaining of any pain discomfort. She would like to have her right side done. She is complaining of significant pain with any use despite wearing splints and taking anti-inflammatories. Like to proceed to have her right side done.    Past Medical History:  Diagnosis Date  . Back pain   . Complication of anesthesia   . Diabetes mellitus   . Hypertension   . Hypothyroidism   . PONV (postoperative nausea and vomiting)   . Rosacea   . Small bowel obstruction ([place])   . Thyroid disease   . Ventral hernia     Past Surgical History:  Procedure Laterality Date  . BACK SURGERY     x3; fusion with rods and screws  . carpatunnel surgery Bilateral   . CARPOMETACARPEL SUSPENSION PLASTY Left 05/23/2018   Procedure: CARPOMETACARPEL [facility]) SUSPENSION PLASTY LEFT THUMB;  Surgeon: Daryll Brod, MD;  Location: [place];  Service: Orthopedics;  Laterality: Left;  . CHOLECYSTECTOMY    . COLON SURGERY    . COLOSTOMY    . ENDOMETRIAL ABLATION    .  INSERTION OF MESH  08/03/2012   Procedure: INSERTION OF MESH;  Surgeon: Gwenyth Ober, MD;  Location: [place];  Service: General;  Laterality: N/A;  . LAPAROSCOPIC BILATERAL SALPINGO OOPHERECTOMY Bilateral 05/12/2015   Procedure: LAPAROSCOPIC BILATERAL SALPINGO OOPHORECTOMY;  Surgeon: Florian Buff, MD;  Location: [facility];  Service: Gynecology;  Laterality: Bilateral;  . LAPAROSCOPIC LYSIS OF ADHESIONS Bilateral 05/12/2015   Procedure: EXTENSIVE LAPAROSCOPIC LYSIS OF ADHESIONS;  Surgeon: Florian Buff, MD;  Location: [facility];  Service: Gynecology;  Laterality: Bilateral;  . LAPAROTOMY  08/03/2012   Procedure: EXPLORATORY LAPAROTOMY;  Surgeon: Gwenyth Ober, MD;  Location: [place];  Service: General;  Laterality: N/A;  with extensive enterolysis  . LIVER BIOPSY    . REVISION COLOSTOMY    . TENDON TRANSFER Left 05/23/2018   Procedure: TENDON TRANSFER WITH TRAPEZIUM EXCISION;  Surgeon: Daryll Brod, MD;  Location: [place];  Service: Orthopedics;  Laterality: Left;  . TRIGGER FINGER RELEASE Left 05/23/2018   Procedure: RELEASE TRIGGER FINGER/A-1 PULLEY LEFT THUMB;  Surgeon: Daryll Brod, MD;  Location: [place];  Service: Orthopedics;  Laterality: Left;  Marland Kitchen VENTRAL HERNIA REPAIR  08/03/2012   Procedure: HERNIA REPAIR VENTRAL ADULT;  Surgeon: Gwenyth Ober, MD;  Location: [place];  Service: General;  Laterality: N/A;  . VENTRAL HERNIA REPAIR  03/13/2016   Procedure: INCISIONAL HERNIA  REPAIR WITH MESH;  Surgeon: Aviva Signs, MD;  Location: [facility];  Service: General;;    Family History  Problem Relation Age of Onset  . Thyroid disease Mother   . Heart disease Father   . Diabetes Mellitus I Maternal Grandmother   . Diabetes Mellitus I Maternal Grandfather   . Diabetes Mellitus I Paternal Grandmother   . Diabetes Mellitus I Paternal Grandfather    Social History:  reports that she has never smoked. She has never used smokeless tobacco. She reports that she does not drink  alcohol or use drugs.  Allergies:  Allergies  Allergen Reactions  . Chocolate Other (See Comments)    rosacea     No medications prior to admission.    No results found for this or any previous visit (from the past 48 hour(s)).  No results found.   Pertinent items are noted in HPI.  Height 5\' 6"  (1.676 m), weight 100.2 kg.  General appearance: alert, cooperative and appears stated age Head: Normocephalic, without obvious abnormality Neck: no JVD Resp: clear to auscultation bilaterally Cardio: regular rate and rhythm, S1, S2 normal, no murmur, click, rub or gallop GI: soft, non-tender; bowel sounds normal; no masses,  no organomegaly Extremities: :right thumb pain Pulses: 2+ and symmetric Skin: Skin color, texture, turgor normal. No rashes or lesions Neurologic: Grossly normal Incision/Wound: na  Assessment/Plan Assessment:   Primary osteoarthritis of both first carpometacarpal joints    Plan: . We have discussed the  treatment of the [facility] arthritis . She would like to have that done. Pre-peri-and postoperative course are discussed along with risk complications. She is aware there is no guarantee to the surgery the possibility of infection recurrence injury to arteries nerves tendons complete relief symptoms and dystrophy. She is scheduled for suspension plasty with micro-link   reconstruction of her right thumb with trapezial excision tendon transfer  as an outpatient under regional anesthesia. Questions were encouraged and answered to her satisfaction.   Daryll Brod 10/03/2018, 4:37 AM

## 2018-10-03 NOTE — Discharge Instructions (Addendum)

## 2018-10-04 ENCOUNTER — Encounter (HOSPITAL_BASED_OUTPATIENT_CLINIC_OR_DEPARTMENT_OTHER): Payer: Self-pay | Admitting: Orthopedic Surgery

## 2018-10-07 ENCOUNTER — Encounter (HOSPITAL_BASED_OUTPATIENT_CLINIC_OR_DEPARTMENT_OTHER): Payer: Self-pay | Admitting: Orthopedic Surgery

## 2018-10-07 NOTE — Addendum Note (Signed)
Addendum  created 10/07/18 2536 by Chanice Brenton, Ernesta Amble, CRNA   Charge Capture section accepted

## 2018-10-16 DIAGNOSIS — M180 Bilateral primary osteoarthritis of first carpometacarpal joints: Secondary | ICD-10-CM | POA: Diagnosis not present

## 2018-10-30 DIAGNOSIS — M25541 Pain in joints of right hand: Secondary | ICD-10-CM | POA: Diagnosis not present

## 2018-10-30 DIAGNOSIS — M25641 Stiffness of right hand, not elsewhere classified: Secondary | ICD-10-CM | POA: Diagnosis not present

## 2018-10-30 DIAGNOSIS — M25631 Stiffness of right wrist, not elsewhere classified: Secondary | ICD-10-CM | POA: Diagnosis not present

## 2018-10-30 DIAGNOSIS — M25531 Pain in right wrist: Secondary | ICD-10-CM | POA: Diagnosis not present

## 2018-11-01 DIAGNOSIS — M25541 Pain in joints of right hand: Secondary | ICD-10-CM | POA: Diagnosis not present

## 2018-11-01 DIAGNOSIS — M25631 Stiffness of right wrist, not elsewhere classified: Secondary | ICD-10-CM | POA: Diagnosis not present

## 2018-11-01 DIAGNOSIS — M25531 Pain in right wrist: Secondary | ICD-10-CM | POA: Diagnosis not present

## 2018-11-01 DIAGNOSIS — M25641 Stiffness of right hand, not elsewhere classified: Secondary | ICD-10-CM | POA: Diagnosis not present

## 2018-11-04 DIAGNOSIS — E1140 Type 2 diabetes mellitus with diabetic neuropathy, unspecified: Secondary | ICD-10-CM | POA: Diagnosis not present

## 2018-11-04 DIAGNOSIS — E1139 Type 2 diabetes mellitus with other diabetic ophthalmic complication: Secondary | ICD-10-CM | POA: Diagnosis not present

## 2018-11-06 DIAGNOSIS — M25641 Stiffness of right hand, not elsewhere classified: Secondary | ICD-10-CM | POA: Diagnosis not present

## 2018-11-06 DIAGNOSIS — M25531 Pain in right wrist: Secondary | ICD-10-CM | POA: Diagnosis not present

## 2018-11-06 DIAGNOSIS — M25631 Stiffness of right wrist, not elsewhere classified: Secondary | ICD-10-CM | POA: Diagnosis not present

## 2018-11-06 DIAGNOSIS — M25541 Pain in joints of right hand: Secondary | ICD-10-CM | POA: Diagnosis not present

## 2018-11-08 DIAGNOSIS — M25641 Stiffness of right hand, not elsewhere classified: Secondary | ICD-10-CM | POA: Diagnosis not present

## 2018-11-08 DIAGNOSIS — M25531 Pain in right wrist: Secondary | ICD-10-CM | POA: Diagnosis not present

## 2018-11-08 DIAGNOSIS — M25541 Pain in joints of right hand: Secondary | ICD-10-CM | POA: Diagnosis not present

## 2018-11-08 DIAGNOSIS — M25631 Stiffness of right wrist, not elsewhere classified: Secondary | ICD-10-CM | POA: Diagnosis not present

## 2018-11-11 DIAGNOSIS — M25631 Stiffness of right wrist, not elsewhere classified: Secondary | ICD-10-CM | POA: Diagnosis not present

## 2018-11-11 DIAGNOSIS — K862 Cyst of pancreas: Secondary | ICD-10-CM | POA: Diagnosis not present

## 2018-11-11 DIAGNOSIS — M25531 Pain in right wrist: Secondary | ICD-10-CM | POA: Diagnosis not present

## 2018-11-11 DIAGNOSIS — M25541 Pain in joints of right hand: Secondary | ICD-10-CM | POA: Diagnosis not present

## 2018-11-11 DIAGNOSIS — M25641 Stiffness of right hand, not elsewhere classified: Secondary | ICD-10-CM | POA: Diagnosis not present

## 2018-11-11 DIAGNOSIS — E1140 Type 2 diabetes mellitus with diabetic neuropathy, unspecified: Secondary | ICD-10-CM | POA: Diagnosis not present

## 2018-11-14 DIAGNOSIS — M25631 Stiffness of right wrist, not elsewhere classified: Secondary | ICD-10-CM | POA: Diagnosis not present

## 2018-11-14 DIAGNOSIS — M25541 Pain in joints of right hand: Secondary | ICD-10-CM | POA: Diagnosis not present

## 2018-11-14 DIAGNOSIS — M25531 Pain in right wrist: Secondary | ICD-10-CM | POA: Diagnosis not present

## 2018-11-14 DIAGNOSIS — M25641 Stiffness of right hand, not elsewhere classified: Secondary | ICD-10-CM | POA: Diagnosis not present

## 2018-11-18 DIAGNOSIS — M25531 Pain in right wrist: Secondary | ICD-10-CM | POA: Diagnosis not present

## 2018-11-18 DIAGNOSIS — M25541 Pain in joints of right hand: Secondary | ICD-10-CM | POA: Diagnosis not present

## 2018-11-18 DIAGNOSIS — M25631 Stiffness of right wrist, not elsewhere classified: Secondary | ICD-10-CM | POA: Diagnosis not present

## 2018-11-18 DIAGNOSIS — M25641 Stiffness of right hand, not elsewhere classified: Secondary | ICD-10-CM | POA: Diagnosis not present

## 2018-11-20 DIAGNOSIS — M25641 Stiffness of right hand, not elsewhere classified: Secondary | ICD-10-CM | POA: Diagnosis not present

## 2018-11-20 DIAGNOSIS — M25541 Pain in joints of right hand: Secondary | ICD-10-CM | POA: Diagnosis not present

## 2018-11-20 DIAGNOSIS — M25631 Stiffness of right wrist, not elsewhere classified: Secondary | ICD-10-CM | POA: Diagnosis not present

## 2018-11-20 DIAGNOSIS — M25531 Pain in right wrist: Secondary | ICD-10-CM | POA: Diagnosis not present

## 2018-11-25 DIAGNOSIS — M25641 Stiffness of right hand, not elsewhere classified: Secondary | ICD-10-CM | POA: Diagnosis not present

## 2018-11-25 DIAGNOSIS — M25541 Pain in joints of right hand: Secondary | ICD-10-CM | POA: Diagnosis not present

## 2018-11-25 DIAGNOSIS — M25531 Pain in right wrist: Secondary | ICD-10-CM | POA: Diagnosis not present

## 2018-11-25 DIAGNOSIS — M25631 Stiffness of right wrist, not elsewhere classified: Secondary | ICD-10-CM | POA: Diagnosis not present

## 2018-11-27 DIAGNOSIS — M25641 Stiffness of right hand, not elsewhere classified: Secondary | ICD-10-CM | POA: Diagnosis not present

## 2018-11-27 DIAGNOSIS — M25541 Pain in joints of right hand: Secondary | ICD-10-CM | POA: Diagnosis not present

## 2018-11-27 DIAGNOSIS — L723 Sebaceous cyst: Secondary | ICD-10-CM | POA: Diagnosis not present

## 2018-11-27 DIAGNOSIS — M25531 Pain in right wrist: Secondary | ICD-10-CM | POA: Diagnosis not present

## 2018-11-27 DIAGNOSIS — M25631 Stiffness of right wrist, not elsewhere classified: Secondary | ICD-10-CM | POA: Diagnosis not present

## 2018-12-02 DIAGNOSIS — M25531 Pain in right wrist: Secondary | ICD-10-CM | POA: Diagnosis not present

## 2018-12-02 DIAGNOSIS — M25541 Pain in joints of right hand: Secondary | ICD-10-CM | POA: Diagnosis not present

## 2018-12-02 DIAGNOSIS — M25631 Stiffness of right wrist, not elsewhere classified: Secondary | ICD-10-CM | POA: Diagnosis not present

## 2018-12-02 DIAGNOSIS — M25641 Stiffness of right hand, not elsewhere classified: Secondary | ICD-10-CM | POA: Diagnosis not present

## 2018-12-04 DIAGNOSIS — M25631 Stiffness of right wrist, not elsewhere classified: Secondary | ICD-10-CM | POA: Diagnosis not present

## 2018-12-04 DIAGNOSIS — M25641 Stiffness of right hand, not elsewhere classified: Secondary | ICD-10-CM | POA: Diagnosis not present

## 2018-12-04 DIAGNOSIS — M25531 Pain in right wrist: Secondary | ICD-10-CM | POA: Diagnosis not present

## 2018-12-04 DIAGNOSIS — M25541 Pain in joints of right hand: Secondary | ICD-10-CM | POA: Diagnosis not present

## 2018-12-09 DIAGNOSIS — M25531 Pain in right wrist: Secondary | ICD-10-CM | POA: Diagnosis not present

## 2018-12-09 DIAGNOSIS — M25541 Pain in joints of right hand: Secondary | ICD-10-CM | POA: Diagnosis not present

## 2018-12-09 DIAGNOSIS — M25631 Stiffness of right wrist, not elsewhere classified: Secondary | ICD-10-CM | POA: Diagnosis not present

## 2018-12-09 DIAGNOSIS — M6281 Muscle weakness (generalized): Secondary | ICD-10-CM | POA: Diagnosis not present

## 2018-12-09 DIAGNOSIS — M25641 Stiffness of right hand, not elsewhere classified: Secondary | ICD-10-CM | POA: Diagnosis not present

## 2018-12-09 DIAGNOSIS — M180 Bilateral primary osteoarthritis of first carpometacarpal joints: Secondary | ICD-10-CM | POA: Diagnosis not present

## 2018-12-13 DIAGNOSIS — M6281 Muscle weakness (generalized): Secondary | ICD-10-CM | POA: Diagnosis not present

## 2018-12-13 DIAGNOSIS — M25631 Stiffness of right wrist, not elsewhere classified: Secondary | ICD-10-CM | POA: Diagnosis not present

## 2018-12-13 DIAGNOSIS — M25541 Pain in joints of right hand: Secondary | ICD-10-CM | POA: Diagnosis not present

## 2018-12-13 DIAGNOSIS — M25531 Pain in right wrist: Secondary | ICD-10-CM | POA: Diagnosis not present

## 2018-12-13 DIAGNOSIS — M180 Bilateral primary osteoarthritis of first carpometacarpal joints: Secondary | ICD-10-CM | POA: Diagnosis not present

## 2018-12-13 DIAGNOSIS — M25641 Stiffness of right hand, not elsewhere classified: Secondary | ICD-10-CM | POA: Diagnosis not present

## 2018-12-18 DIAGNOSIS — M6281 Muscle weakness (generalized): Secondary | ICD-10-CM | POA: Diagnosis not present

## 2018-12-18 DIAGNOSIS — M25641 Stiffness of right hand, not elsewhere classified: Secondary | ICD-10-CM | POA: Diagnosis not present

## 2018-12-18 DIAGNOSIS — M25631 Stiffness of right wrist, not elsewhere classified: Secondary | ICD-10-CM | POA: Diagnosis not present

## 2018-12-18 DIAGNOSIS — M25541 Pain in joints of right hand: Secondary | ICD-10-CM | POA: Diagnosis not present

## 2018-12-18 DIAGNOSIS — M180 Bilateral primary osteoarthritis of first carpometacarpal joints: Secondary | ICD-10-CM | POA: Diagnosis not present

## 2018-12-18 DIAGNOSIS — M25531 Pain in right wrist: Secondary | ICD-10-CM | POA: Diagnosis not present

## 2018-12-20 DIAGNOSIS — M25641 Stiffness of right hand, not elsewhere classified: Secondary | ICD-10-CM | POA: Diagnosis not present

## 2018-12-20 DIAGNOSIS — M25531 Pain in right wrist: Secondary | ICD-10-CM | POA: Diagnosis not present

## 2018-12-20 DIAGNOSIS — M180 Bilateral primary osteoarthritis of first carpometacarpal joints: Secondary | ICD-10-CM | POA: Diagnosis not present

## 2018-12-20 DIAGNOSIS — M25541 Pain in joints of right hand: Secondary | ICD-10-CM | POA: Diagnosis not present

## 2018-12-20 DIAGNOSIS — M6281 Muscle weakness (generalized): Secondary | ICD-10-CM | POA: Diagnosis not present

## 2018-12-20 DIAGNOSIS — M25631 Stiffness of right wrist, not elsewhere classified: Secondary | ICD-10-CM | POA: Diagnosis not present

## 2018-12-25 DIAGNOSIS — M180 Bilateral primary osteoarthritis of first carpometacarpal joints: Secondary | ICD-10-CM | POA: Diagnosis not present

## 2019-01-07 ENCOUNTER — Other Ambulatory Visit (HOSPITAL_COMMUNITY): Payer: Self-pay | Admitting: Obstetrics & Gynecology

## 2019-01-07 DIAGNOSIS — Z1231 Encounter for screening mammogram for malignant neoplasm of breast: Secondary | ICD-10-CM

## 2019-01-23 ENCOUNTER — Ambulatory Visit (HOSPITAL_COMMUNITY)
Admission: RE | Admit: 2019-01-23 | Discharge: 2019-01-23 | Disposition: A | Discharge status: Home / Self Care | Payer: Medicare Other | Source: Ambulatory Visit | Attending: Obstetrics & Gynecology | Admitting: Obstetrics & Gynecology

## 2019-01-23 ENCOUNTER — Other Ambulatory Visit: Payer: Self-pay

## 2019-01-23 DIAGNOSIS — Z1231 Encounter for screening mammogram for malignant neoplasm of breast: Secondary | ICD-10-CM | POA: Diagnosis not present

## 2019-02-04 DIAGNOSIS — K863 Pseudocyst of pancreas: Secondary | ICD-10-CM | POA: Diagnosis not present

## 2019-02-04 DIAGNOSIS — E785 Hyperlipidemia, unspecified: Secondary | ICD-10-CM | POA: Diagnosis not present

## 2019-02-04 DIAGNOSIS — I10 Essential (primary) hypertension: Secondary | ICD-10-CM | POA: Diagnosis not present

## 2019-02-04 DIAGNOSIS — Z79899 Other long term (current) drug therapy: Secondary | ICD-10-CM | POA: Diagnosis not present

## 2019-02-04 DIAGNOSIS — E1140 Type 2 diabetes mellitus with diabetic neuropathy, unspecified: Secondary | ICD-10-CM | POA: Diagnosis not present

## 2019-02-04 DIAGNOSIS — E039 Hypothyroidism, unspecified: Secondary | ICD-10-CM | POA: Diagnosis not present

## 2019-02-12 DIAGNOSIS — E039 Hypothyroidism, unspecified: Secondary | ICD-10-CM | POA: Diagnosis not present

## 2019-02-12 DIAGNOSIS — E1140 Type 2 diabetes mellitus with diabetic neuropathy, unspecified: Secondary | ICD-10-CM | POA: Diagnosis not present

## 2019-02-13 ENCOUNTER — Other Ambulatory Visit: Payer: Self-pay | Admitting: Internal Medicine

## 2019-02-13 DIAGNOSIS — K862 Cyst of pancreas: Secondary | ICD-10-CM

## 2019-03-11 ENCOUNTER — Ambulatory Visit
Admission: RE | Admit: 2019-03-11 | Discharge: 2019-03-11 | Disposition: A | Discharge status: Home / Self Care | Payer: Medicare Other | Source: Ambulatory Visit | Attending: Internal Medicine | Admitting: Internal Medicine

## 2019-03-11 DIAGNOSIS — K862 Cyst of pancreas: Secondary | ICD-10-CM | POA: Diagnosis not present

## 2019-03-11 DIAGNOSIS — D3502 Benign neoplasm of left adrenal gland: Secondary | ICD-10-CM | POA: Diagnosis not present

## 2019-03-11 DIAGNOSIS — K760 Fatty (change of) liver, not elsewhere classified: Secondary | ICD-10-CM | POA: Diagnosis not present

## 2019-03-11 MED ORDER — GADOBENATE DIMEGLUMINE 529 MG/ML IV SOLN
20.0000 mL | Freq: Once | INTRAVENOUS | Status: AC | PRN
  Administered 2019-03-11: 20 mL via INTRAVENOUS

## 2019-04-03 DIAGNOSIS — L0100 Impetigo, unspecified: Secondary | ICD-10-CM | POA: Diagnosis not present

## 2019-04-03 DIAGNOSIS — L718 Other rosacea: Secondary | ICD-10-CM | POA: Diagnosis not present

## 2019-05-08 DIAGNOSIS — E1140 Type 2 diabetes mellitus with diabetic neuropathy, unspecified: Secondary | ICD-10-CM | POA: Diagnosis not present

## 2019-05-15 DIAGNOSIS — K862 Cyst of pancreas: Secondary | ICD-10-CM | POA: Diagnosis not present

## 2019-05-15 DIAGNOSIS — E1140 Type 2 diabetes mellitus with diabetic neuropathy, unspecified: Secondary | ICD-10-CM | POA: Diagnosis not present

## 2019-05-27 DIAGNOSIS — Z23 Encounter for immunization: Secondary | ICD-10-CM | POA: Diagnosis not present

## 2019-07-09 DIAGNOSIS — E1140 Type 2 diabetes mellitus with diabetic neuropathy, unspecified: Secondary | ICD-10-CM | POA: Diagnosis not present

## 2019-07-25 DIAGNOSIS — E1140 Type 2 diabetes mellitus with diabetic neuropathy, unspecified: Secondary | ICD-10-CM | POA: Diagnosis not present

## 2019-07-25 DIAGNOSIS — I10 Essential (primary) hypertension: Secondary | ICD-10-CM | POA: Diagnosis not present

## 2019-08-13 ENCOUNTER — Emergency Department (HOSPITAL_COMMUNITY): Payer: Medicare Other

## 2019-08-13 ENCOUNTER — Other Ambulatory Visit: Payer: Self-pay

## 2019-08-13 ENCOUNTER — Emergency Department (HOSPITAL_COMMUNITY)
Admission: EM | Admit: 2019-08-13 | Discharge: 2019-08-13 | Disposition: A | Discharge status: Home / Self Care | Payer: Medicare Other | Attending: Emergency Medicine | Admitting: Emergency Medicine

## 2019-08-13 ENCOUNTER — Encounter (HOSPITAL_COMMUNITY): Payer: Self-pay | Admitting: Emergency Medicine

## 2019-08-13 DIAGNOSIS — R0981 Nasal congestion: Secondary | ICD-10-CM | POA: Diagnosis present

## 2019-08-13 DIAGNOSIS — E039 Hypothyroidism, unspecified: Secondary | ICD-10-CM | POA: Diagnosis not present

## 2019-08-13 DIAGNOSIS — Z794 Long term (current) use of insulin: Secondary | ICD-10-CM | POA: Insufficient documentation

## 2019-08-13 DIAGNOSIS — R531 Weakness: Secondary | ICD-10-CM

## 2019-08-13 DIAGNOSIS — Z7982 Long term (current) use of aspirin: Secondary | ICD-10-CM | POA: Diagnosis not present

## 2019-08-13 DIAGNOSIS — I10 Essential (primary) hypertension: Secondary | ICD-10-CM | POA: Insufficient documentation

## 2019-08-13 DIAGNOSIS — E119 Type 2 diabetes mellitus without complications: Secondary | ICD-10-CM | POA: Insufficient documentation

## 2019-08-13 DIAGNOSIS — U071 COVID-19: Secondary | ICD-10-CM | POA: Diagnosis not present

## 2019-08-13 DIAGNOSIS — J019 Acute sinusitis, unspecified: Secondary | ICD-10-CM | POA: Insufficient documentation

## 2019-08-13 DIAGNOSIS — Z79899 Other long term (current) drug therapy: Secondary | ICD-10-CM | POA: Insufficient documentation

## 2019-08-13 LAB — COMPREHENSIVE METABOLIC PANEL
ALT: 27 U/L (ref 0–44)
AST: 28 U/L (ref 15–41)
Albumin: 3.9 g/dL (ref 3.5–5.0)
Alkaline Phosphatase: 62 U/L (ref 38–126)
Anion gap: 12 (ref 5–15)
BUN: 9 mg/dL (ref 6–20)
CO2: 23 mmol/L (ref 22–32)
Calcium: 8.7 mg/dL — ABNORMAL LOW (ref 8.9–10.3)
Chloride: 101 mmol/L (ref 98–111)
Creatinine, Ser: 0.45 mg/dL (ref 0.44–1.00)
GFR calc Af Amer: >60 mL/min (ref >60)
GFR calc non Af Amer: >60 mL/min (ref >60)
Glucose, Bld: 105 mg/dL — ABNORMAL HIGH (ref 70–99)
Potassium: 3.7 mmol/L (ref 3.5–5.1)
Sodium: 136 mmol/L (ref 135–145)
Total Bilirubin: 1.1 mg/dL (ref 0.3–1.2)
Total Protein: 7.7 g/dL (ref 6.5–8.1)

## 2019-08-13 LAB — CBC WITH DIFFERENTIAL/PLATELET
Abs Immature Granulocytes: 0.02 10*3/uL (ref 0.00–0.07)
Basophils Absolute: 0 10*3/uL (ref 0.0–0.1)
Basophils Relative: 0 %
Eosinophils Absolute: 0 10*3/uL (ref 0.0–0.5)
Eosinophils Relative: 0 %
HCT: 46.3 % — ABNORMAL HIGH (ref 36.0–46.0)
Hemoglobin: 15.2 g/dL — ABNORMAL HIGH (ref 12.0–15.0)
Immature Granulocytes: 0 %
Lymphocytes Relative: 19 %
Lymphs Abs: 1.1 10*3/uL (ref 0.7–4.0)
MCH: 29.3 pg (ref 26.0–34.0)
MCHC: 32.8 g/dL (ref 30.0–36.0)
MCV: 89.4 fL (ref 80.0–100.0)
Monocytes Absolute: 0.5 10*3/uL (ref 0.1–1.0)
Monocytes Relative: 9 %
Neutro Abs: 4.3 10*3/uL (ref 1.7–7.7)
Neutrophils Relative %: 72 %
Platelets: 204 10*3/uL (ref 150–400)
RBC: 5.18 MIL/uL — ABNORMAL HIGH (ref 3.87–5.11)
RDW: 12.8 % (ref 11.5–15.5)
WBC: 5.9 10*3/uL (ref 4.0–10.5)
nRBC: 0 % (ref 0.0–0.2)

## 2019-08-13 LAB — GLUCOSE, CAPILLARY: Glucose-Capillary: 107 mg/dL — ABNORMAL HIGH (ref 70–99)

## 2019-08-13 MED ORDER — ONDANSETRON 8 MG PO TBDP: ORAL_TABLET | ORAL | 0 refills | Status: DC

## 2019-08-13 MED ORDER — ACETAMINOPHEN 500 MG PO TABS
1000.0000 mg | ORAL_TABLET | Freq: Once | ORAL | Status: AC
  Administered 2019-08-13: 21:00:00 1000 mg via ORAL
  Filled 2019-08-13: qty 2

## 2019-08-13 MED ORDER — AMOXICILLIN-POT CLAVULANATE 500-125 MG PO TABS: 1.0000 | ORAL_TABLET | Freq: Three times a day (TID) | ORAL | 0 refills | Status: DC

## 2019-08-13 NOTE — ED Provider Notes (Signed)
[facility] Provider Note   CSN: [id number] Arrival date & time: 08/13/19  1742     History Chief Complaint  Patient presents with  . Weakness    Nancy [name] is a 53 y.o. female.  Patient is a 53 year old female with history of diabetes, hypertension.  She presents today for evaluation of sinus congestion.  Patient was prescribed a Z-Pak 1 week ago by her primary doctor, however this has not helped.  She denies chest pain, difficulty breathing, or fever.  This feels similar to prior sinus infections she has had in the past.  She denies any contact with any known Covid positive family members or acquaintances.  The history is provided by the patient.  Weakness Severity:  Moderate Onset quality:  Gradual Duration:  10 days Timing:  Constant Progression:  Worsening Chronicity:  New Relieved by:  Nothing Worsened by:  Nothing Ineffective treatments:  None tried      Past Medical History:  Diagnosis Date  . Back pain   . Complication of anesthesia   . Diabetes mellitus   . Hypertension   . Hypothyroidism   . PONV (postoperative nausea and vomiting)   . Rosacea   . Small bowel obstruction ([place])   . Thyroid disease   . Ventral hernia     Patient Active Problem List   Diagnosis Date Noted  . Infected zoster 04/29/2013  . Hematoma 03/27/2013  . Acute postoperative pain of abdomen 12/27/2012  . Postop check 10/01/2012  . Wound seroma 08/30/2012  . Seroma 08/22/2012  . Chronic back pain 08/10/2012  . Hypothyroidism 08/10/2012  . Ventral hernia with obstruction 08/10/2012  . Rosacea 08/10/2012  . Hypokalemia 08/10/2012  . DM 02/15/2009    Past Surgical History:  Procedure Laterality Date  . BACK SURGERY     x3; fusion with rods and screws  . carpatunnel surgery Bilateral   . CARPOMETACARPEL SUSPENSION PLASTY Left 05/23/2018   Procedure: CARPOMETACARPEL [facility]) SUSPENSION PLASTY LEFT THUMB;  Surgeon: Daryll Brod, MD;  Location: [place];  Service: Orthopedics;  Laterality: Left;  . CARPOMETACARPEL SUSPENSION PLASTY Right 10/03/2018   Procedure: RIGHT THUMB SUSPENSONPLASTY WITH MICRO LINK SUTURE AND TRAPEXIUM EXCISION;  Surgeon: Daryll Brod, MD;  Location: [place];  Service: Orthopedics;  Laterality: Right;  AXILLARY BLOCK  . CHOLECYSTECTOMY    . COLON SURGERY    . COLOSTOMY    . ENDOMETRIAL ABLATION    . INSERTION OF MESH  08/03/2012   Procedure: INSERTION OF MESH;  Surgeon: Gwenyth Ober, MD;  Location: [place];  Service: General;  Laterality: N/A;  . LAPAROSCOPIC BILATERAL SALPINGO OOPHERECTOMY Bilateral 05/12/2015   Procedure: LAPAROSCOPIC BILATERAL SALPINGO OOPHORECTOMY;  Surgeon: Florian Buff, MD;  Location: [facility];  Service: Gynecology;  Laterality: Bilateral;  . LAPAROSCOPIC LYSIS OF ADHESIONS Bilateral 05/12/2015   Procedure: EXTENSIVE LAPAROSCOPIC LYSIS OF ADHESIONS;  Surgeon: Florian Buff, MD;  Location: [facility];  Service: Gynecology;  Laterality: Bilateral;  . LAPAROTOMY  08/03/2012   Procedure: EXPLORATORY LAPAROTOMY;  Surgeon: Gwenyth Ober, MD;  Location: [place];  Service: General;  Laterality: N/A;  with extensive enterolysis  . LIVER BIOPSY    . REVISION COLOSTOMY    . TENDON TRANSFER Left 05/23/2018   Procedure: TENDON TRANSFER WITH TRAPEZIUM EXCISION;  Surgeon: Daryll Brod, MD;  Location: [place];  Service: Orthopedics;  Laterality: Left;  . TRIGGER FINGER RELEASE Left 05/23/2018   Procedure: RELEASE TRIGGER FINGER/A-1  PULLEY LEFT THUMB;  Surgeon: Daryll Brod, MD;  Location: [place];  Service: Orthopedics;  Laterality: Left;  Marland Kitchen VENTRAL HERNIA REPAIR  08/03/2012   Procedure: HERNIA REPAIR VENTRAL ADULT;  Surgeon: Gwenyth Ober, MD;  Location: [place];  Service: General;  Laterality: N/A;  . VENTRAL HERNIA REPAIR  03/13/2016   Procedure: INCISIONAL HERNIA REPAIR WITH MESH;  Surgeon: Aviva Signs, MD;  Location: [facility];  Service: General;;     OB  History    Gravida  0   Para  0   Term  0   Preterm  0   AB  0   Living  0     SAB  0   TAB  0   Ectopic  0   Multiple  0   Live Births  0           Family History  Problem Relation Age of Onset  . Thyroid disease Mother   . Heart disease Father   . Diabetes Mellitus I Maternal Grandmother   . Diabetes Mellitus I Maternal Grandfather   . Diabetes Mellitus I Paternal Grandmother   . Diabetes Mellitus I Paternal Grandfather     Social History   Tobacco Use  . Smoking status: Never Smoker  . Smokeless tobacco: Never Used  Substance Use Topics  . Alcohol use: No    Alcohol/week: 0.0 standard drinks  . Drug use: No    Home Medications Prior to Admission medications   Medication Sig Start Date End Date Taking? Authorizing Provider  aspirin 81 MG EC tablet Take 81 mg by mouth every evening.     [provider]  atorvastatin (LIPITOR) 10 MG tablet Take 10 mg by mouth every other day.     [provider]  Canagliflozin (INVOKANA) 300 MG TABS Take 150 mg by mouth every morning. Before breakfast     [provider]  Cholecalciferol 5000 UNITS capsule Take 5,000 Units by mouth every morning. Vitamin D    [provider]  doxycycline (VIBRA-TABS) 100 MG tablet Take 100 mg by mouth as needed.     [provider]  enalapril (VASOTEC) 5 MG tablet Take 5 mg by mouth at bedtime.      [provider]  Insulin Aspart (NOVOLOG ) Inject 55 Units into the skin every morning.    [provider]  insulin aspart (NOVOLOG) 100 UNIT/ML injection Inject 50 Units into the skin at bedtime.    [provider]  levothyroxine (SYNTHROID, LEVOTHROID) 150 MCG tablet Take 150 mcg by mouth daily before breakfast.     [provider]  Liraglutide (VICTOZA) 18 MG/3ML SOLN Inject 1.8 mg into the skin daily.     [provider]  metFORMIN (GLUCOPHAGE) 1000 MG tablet Take 1 tablet (1,000 mg total) by mouth  2 (two) times daily. 12/11/12   Riebock, Estill Bakes, NP  methocarbamol (ROBAXIN) 500 MG tablet Take 500 mg by mouth every 6 (six) hours as needed for muscle spasms.    [provider]  MetroNIDAZOLE-Cleanser 0.75 % (Cream) KIT Apply 1 application topically as needed. To face for rosacea     [provider]  traMADol (ULTRAM) 50 MG tablet Take 1 tablet (50 mg total) by mouth every 6 (six) hours as needed. 10/03/18   Daryll Brod, MD    Allergies    Chocolate  Review of Systems   Review of Systems  Neurological: Positive for weakness.  All other systems reviewed  and are negative.   Physical Exam Updated Vital Signs BP (!) 146/80 (BP Location: Right Arm)   Pulse 96   Temp 99.7 F (37.6 C) (Oral)   Resp 18   Ht '5\' 7"'$  (1.702 m)   Wt 99.8 kg   SpO2 95%   BMI 34.46 kg/m   Physical Exam Vitals and nursing note reviewed.  Constitutional:      General: She is not in acute distress.    Appearance: She is well-developed. She is not diaphoretic.  HENT:     Head: Normocephalic and atraumatic.     Mouth/Throat:     Mouth: Mucous membranes are moist.  Cardiovascular:     Rate and Rhythm: Normal rate and regular rhythm.     Heart sounds: No murmur. No friction rub. No gallop.   Pulmonary:     Effort: Pulmonary effort is normal. No respiratory distress.     Breath sounds: Normal breath sounds. No wheezing.  Abdominal:     General: Bowel sounds are normal. There is no distension.     Palpations: Abdomen is soft.     Tenderness: There is no abdominal tenderness.  Musculoskeletal:        General: Normal range of motion.     Cervical back: Normal range of motion and neck supple.  Skin:    General: Skin is warm and dry.  Neurological:     Mental Status: She is alert and oriented to person, place, and time.     ED Results / Procedures / Treatments   Labs (all labs ordered are listed, but only abnormal results are displayed) Labs Reviewed  CBC WITH DIFFERENTIAL/PLATELET  - Abnormal; Notable for the following components:      Result Value   RBC 5.18 (*)    Hemoglobin 15.2 (*)    HCT 46.3 (*)    All other components within normal limits  GLUCOSE, CAPILLARY - Abnormal; Notable for the following components:   Glucose-Capillary 107 (*)    All other components within normal limits  SARS CORONAVIRUS 2 (TAT 6-24 HRS)  COMPREHENSIVE METABOLIC PANEL  URINALYSIS, ROUTINE W REFLEX MICROSCOPIC    EKG None  Radiology No results found.  Procedures Procedures (including critical care time)  Medications Ordered in ED Medications - No data to display  ED Course  I have reviewed the triage vital signs and the nursing notes.  Pertinent labs & imaging results that were available during my care of the patient were reviewed by me and considered in my medical decision making (see chart for details).  MDM Rules/Calculators/A&P  Patient to be given a different antibiotic for her sinusitis.  Covid test obtained.  Appears appropriate for discharge.  Labs are reassuring.  Final Clinical Impression(s) / ED Diagnoses Final diagnoses:  None    Rx / DC Orders ED Discharge Orders    None       Veryl Speak, MD 08/13/19 2039

## 2019-08-13 NOTE — Discharge Instructions (Addendum)
Begin taking Augmentin as prescribed.  Continue over-the-counter medications as needed for symptom relief.  Isolate at home until the results of your Covid test are known.  This should be within the next 24 hours.         Infection Prevention Recommendations for Individuals Confirmed to have, or Being Evaluated for, 2019 Novel Coronavirus (COVID-19) Infection Who Receive Care at Home  Individuals who are confirmed to have, or are being evaluated for, COVID-19 should follow the prevention steps below until a healthcare provider or local or state health department says they can return to normal activities.  Stay home except to get medical care You should restrict activities outside your home, except for getting medical care. Do not go to work, school, or public areas, and do not use public transportation or taxis.  Call ahead before visiting your doctor Before your medical appointment, call the healthcare provider and tell them that you have, or are being evaluated for, COVID-19 infection. This will help the healthcare provider's office take steps to keep other people from getting infected. Ask your healthcare provider to call the local or state health department.  Monitor your symptoms Seek prompt medical attention if your illness is worsening (e.g., difficulty breathing). Before going to your medical appointment, call the healthcare provider and tell them that you have, or are being evaluated for, COVID-19 infection. Ask your healthcare provider to call the local or state health department.  Wear a facemask You should wear a facemask that covers your nose and mouth when you are in the same room with other people and when you visit a healthcare provider. People who live with or visit you should also wear a facemask while they are in the same room with you.  Separate yourself from other people in your home As much as possible, you should stay in a different room from other  people in your home. Also, you should use a separate bathroom, if available.  Avoid sharing household items You should not share dishes, drinking glasses, cups, eating utensils, towels, bedding, or other items with other people in your home. After using these items, you should wash them thoroughly with soap and water.  Cover your coughs and sneezes Cover your mouth and nose with a tissue when you cough or sneeze, or you can cough or sneeze into your sleeve. Throw used tissues in a lined trash can, and immediately wash your hands with soap and water for at least 20 seconds or use an alcohol-based hand rub.  Wash your [facility] your hands often and thoroughly with soap and water for at least 20 seconds. You can use an alcohol-based hand sanitizer if soap and water are not available and if your hands are not visibly dirty. Avoid touching your eyes, nose, and mouth with unwashed hands.   Prevention Steps for Caregivers and Household Members of Individuals Confirmed to have, or Being Evaluated for, COVID-19 Infection Being Cared for in the Home  If you live with, or provide care at home for, a person confirmed to have, or being evaluated for, COVID-19 infection please follow these guidelines to prevent infection:  Follow healthcare provider's instructions Make sure that you understand and can help the patient follow any healthcare provider instructions for all care.  Provide for the patient's basic needs You should help the patient with basic needs in the home and provide support for getting groceries, prescriptions, and other personal needs.  Monitor the patient's symptoms If they are getting sicker, call his  or her medical provider and tell them that the patient has, or is being evaluated for, COVID-19 infection. This will help the healthcare provider's office take steps to keep other people from getting infected. Ask the healthcare provider to call the local or state health  department.  Limit the number of people who have contact with the patient If possible, have only one caregiver for the patient. Other household members should stay in another home or place of residence. If this is not possible, they should stay in another room, or be separated from the patient as much as possible. Use a separate bathroom, if available. Restrict visitors who do not have an essential need to be in the home.  Keep older adults, very young children, and other sick people away from the patient Keep older adults, very young children, and those who have compromised immune systems or chronic health conditions away from the patient. This includes people with chronic heart, lung, or kidney conditions, diabetes, and cancer.  Ensure good ventilation Make sure that shared spaces in the home have good air flow, such as from an air conditioner or an opened window, weather permitting.  Wash your hands often Wash your hands often and thoroughly with soap and water for at least 20 seconds. You can use an alcohol based hand sanitizer if soap and water are not available and if your hands are not visibly dirty. Avoid touching your eyes, nose, and mouth with unwashed hands. Use disposable paper towels to dry your hands. If not available, use dedicated cloth towels and replace them when they become wet.  Wear a facemask and gloves Wear a disposable facemask at all times in the room and gloves when you touch or have contact with the patient's blood, body fluids, and/or secretions or excretions, such as sweat, saliva, sputum, nasal mucus, vomit, urine, or feces.  Ensure the mask fits over your nose and mouth tightly, and do not touch it during use. Throw out disposable facemasks and gloves after using them. Do not reuse. Wash your hands immediately after removing your facemask and gloves. If your personal clothing becomes contaminated, carefully remove clothing and launder. Wash your hands after  handling contaminated clothing. Place all used disposable facemasks, gloves, and other waste in a lined container before disposing them with other household waste. Remove gloves and wash your hands immediately after handling these items.  Do not share dishes, glasses, or other household items with the patient Avoid sharing household items. You should not share dishes, drinking glasses, cups, eating utensils, towels, bedding, or other items with a patient who is confirmed to have, or being evaluated for, COVID-19 infection. After the person uses these items, you should wash them thoroughly with soap and water.  Wash laundry thoroughly Immediately remove and wash clothes or bedding that have blood, body fluids, and/or secretions or excretions, such as sweat, saliva, sputum, nasal mucus, vomit, urine, or feces, on them. Wear gloves when handling laundry from the patient. Read and follow directions on labels of laundry or clothing items and detergent. In general, wash and dry with the warmest temperatures recommended on the label.  Clean all areas the individual has used often Clean all touchable surfaces, such as counters, tabletops, doorknobs, bathroom fixtures, toilets, phones, keyboards, tablets, and bedside tables, every day. Also, clean any surfaces that may have blood, body fluids, and/or secretions or excretions on them. Wear gloves when cleaning surfaces the patient has come in contact with. Use a diluted bleach solution (e.g., dilute  bleach with 1 part bleach and 10 parts water) or a household disinfectant with a label that says EPA-registered for coronaviruses. To make a bleach solution at home, add 1 tablespoon of bleach to 1 quart (4 cups) of water. For a larger supply, add  cup of bleach to 1 gallon (16 cups) of water. Read labels of cleaning products and follow recommendations provided on product labels. Labels contain instructions for safe and effective use of the cleaning product  including precautions you should take when applying the product, such as wearing gloves or eye protection and making sure you have good ventilation during use of the product. Remove gloves and wash hands immediately after cleaning.  Monitor yourself for signs and symptoms of illness Caregivers and household members are considered close contacts, should monitor their health, and will be asked to limit movement outside of the home to the extent possible. Follow the monitoring steps for close contacts listed on the symptom monitoring form.   ? If you have additional questions, contact your local health department or call the epidemiologist on call at [phone] (available 24/7). ? This guidance is subject to change. For the most up-to-date guidance from [facility], please refer to their website: YouBlogs.pl

## 2019-08-13 NOTE — ED Triage Notes (Signed)
Pt was recently placed on a z-pack for sinuses. States " Nancy [name] nto gotten any better" c/o of weakness, fever, congestion and decreased appetite.

## 2019-08-14 ENCOUNTER — Telehealth (HOSPITAL_COMMUNITY): Payer: Self-pay | Admitting: Emergency Medicine

## 2019-08-14 LAB — SARS CORONAVIRUS 2 (TAT 6-24 HRS): SARS Coronavirus 2: POSITIVE — AB

## 2019-08-14 MED ORDER — PROMETHAZINE HCL 25 MG PO TABS: 25.0000 mg | ORAL_TABLET | Freq: Four times a day (QID) | ORAL | 0 refills | Status: DC | PRN

## 2019-08-14 NOTE — Telephone Encounter (Signed)
Pt was seen by Dr. Stark Jock on 12/23 and was discharged home with Zofran.  Insurance does not cover Zofran.  I have placed a script for Phenergan through her pharmacy as a replacement.

## 2019-08-14 NOTE — ED Notes (Signed)
[facility] at [facility] reports pt positive for covid.   Notified Dr Sabra Heck and notified pt.  Pt verbalized understanding.

## 2019-09-19 ENCOUNTER — Encounter (HOSPITAL_BASED_OUTPATIENT_CLINIC_OR_DEPARTMENT_OTHER): Payer: Self-pay | Admitting: Orthopedic Surgery

## 2019-09-19 ENCOUNTER — Other Ambulatory Visit: Payer: Self-pay

## 2019-09-19 ENCOUNTER — Encounter (HOSPITAL_BASED_OUTPATIENT_CLINIC_OR_DEPARTMENT_OTHER)
Admission: RE | Admit: 2019-09-19 | Discharge: 2019-09-19 | Disposition: A | Discharge status: Home / Self Care | Payer: Medicare Other | Source: Ambulatory Visit | Attending: Orthopedic Surgery | Admitting: Orthopedic Surgery

## 2019-09-19 ENCOUNTER — Other Ambulatory Visit: Payer: Self-pay | Admitting: Orthopedic Surgery

## 2019-09-19 DIAGNOSIS — Z01818 Encounter for other preprocedural examination: Secondary | ICD-10-CM | POA: Insufficient documentation

## 2019-09-19 DIAGNOSIS — M65311 Trigger thumb, right thumb: Secondary | ICD-10-CM | POA: Diagnosis not present

## 2019-09-19 LAB — BASIC METABOLIC PANEL
Anion gap: 12 (ref 5–15)
BUN: 10 mg/dL (ref 6–20)
CO2: 24 mmol/L (ref 22–32)
Calcium: 9.6 mg/dL (ref 8.9–10.3)
Chloride: 105 mmol/L (ref 98–111)
Creatinine, Ser: 0.47 mg/dL (ref 0.44–1.00)
GFR calc Af Amer: >60 mL/min (ref >60)
GFR calc non Af Amer: >60 mL/min (ref >60)
Glucose, Bld: 86 mg/dL (ref 70–99)
Potassium: 3.8 mmol/L (ref 3.5–5.1)
Sodium: 141 mmol/L (ref 135–145)

## 2019-09-19 NOTE — Progress Notes (Signed)

## 2019-09-23 ENCOUNTER — Encounter (HOSPITAL_BASED_OUTPATIENT_CLINIC_OR_DEPARTMENT_OTHER)
Admission: RE | Disposition: A | Discharge status: Home / Self Care | Payer: Self-pay | Source: Home / Self Care | Attending: Orthopedic Surgery

## 2019-09-23 ENCOUNTER — Ambulatory Visit (HOSPITAL_BASED_OUTPATIENT_CLINIC_OR_DEPARTMENT_OTHER): Payer: Medicare Other | Admitting: Anesthesiology

## 2019-09-23 ENCOUNTER — Ambulatory Visit (HOSPITAL_BASED_OUTPATIENT_CLINIC_OR_DEPARTMENT_OTHER)
Admission: RE | Admit: 2019-09-23 | Discharge: 2019-09-23 | Disposition: A | Discharge status: Home / Self Care | Payer: Medicare Other | Attending: Orthopedic Surgery | Admitting: Orthopedic Surgery

## 2019-09-23 ENCOUNTER — Encounter (HOSPITAL_BASED_OUTPATIENT_CLINIC_OR_DEPARTMENT_OTHER): Payer: Self-pay | Admitting: Orthopedic Surgery

## 2019-09-23 ENCOUNTER — Other Ambulatory Visit: Payer: Self-pay

## 2019-09-23 DIAGNOSIS — L719 Rosacea, unspecified: Secondary | ICD-10-CM | POA: Insufficient documentation

## 2019-09-23 DIAGNOSIS — M65311 Trigger thumb, right thumb: Secondary | ICD-10-CM | POA: Diagnosis not present

## 2019-09-23 DIAGNOSIS — Z8249 Family history of ischemic heart disease and other diseases of the circulatory system: Secondary | ICD-10-CM | POA: Diagnosis not present

## 2019-09-23 DIAGNOSIS — Z9049 Acquired absence of other specified parts of digestive tract: Secondary | ICD-10-CM | POA: Insufficient documentation

## 2019-09-23 DIAGNOSIS — Z8349 Family history of other endocrine, nutritional and metabolic diseases: Secondary | ICD-10-CM | POA: Diagnosis not present

## 2019-09-23 DIAGNOSIS — Z91018 Allergy to other foods: Secondary | ICD-10-CM | POA: Insufficient documentation

## 2019-09-23 DIAGNOSIS — Z6834 Body mass index (BMI) 34.0-34.9, adult: Secondary | ICD-10-CM | POA: Diagnosis not present

## 2019-09-23 DIAGNOSIS — Z833 Family history of diabetes mellitus: Secondary | ICD-10-CM | POA: Insufficient documentation

## 2019-09-23 DIAGNOSIS — E119 Type 2 diabetes mellitus without complications: Secondary | ICD-10-CM | POA: Diagnosis not present

## 2019-09-23 DIAGNOSIS — E039 Hypothyroidism, unspecified: Secondary | ICD-10-CM | POA: Insufficient documentation

## 2019-09-23 DIAGNOSIS — M659 Synovitis and tenosynovitis, unspecified: Secondary | ICD-10-CM | POA: Diagnosis not present

## 2019-09-23 DIAGNOSIS — E669 Obesity, unspecified: Secondary | ICD-10-CM | POA: Diagnosis not present

## 2019-09-23 HISTORY — PX: TRIGGER FINGER RELEASE: SHX641

## 2019-09-23 LAB — GLUCOSE, CAPILLARY
Glucose-Capillary: 119 mg/dL — ABNORMAL HIGH (ref 70–99)
Glucose-Capillary: 123 mg/dL — ABNORMAL HIGH (ref 70–99)

## 2019-09-23 SURGERY — RELEASE, A1 PULLEY, FOR TRIGGER FINGER
Anesthesia: Regional | Site: Arm Lower | Laterality: Right

## 2019-09-23 MED ORDER — CEFAZOLIN SODIUM-DEXTROSE 2-4 GM/100ML-% IV SOLN
2.0000 g | INTRAVENOUS | Status: AC
  Administered 2019-09-23: 2 g via INTRAVENOUS

## 2019-09-23 MED ORDER — FENTANYL CITRATE (PF) 100 MCG/2ML IJ SOLN
INTRAMUSCULAR | Status: AC
  Filled 2019-09-23: qty 2

## 2019-09-23 MED ORDER — PHENYLEPHRINE 40 MCG/ML (10ML) SYRINGE FOR IV PUSH (FOR BLOOD PRESSURE SUPPORT)
PREFILLED_SYRINGE | INTRAVENOUS | Status: AC
  Filled 2019-09-23: qty 10

## 2019-09-23 MED ORDER — OXYCODONE HCL 5 MG PO TABS: 5.0000 mg | ORAL_TABLET | Freq: Once | ORAL | Status: DC | PRN

## 2019-09-23 MED ORDER — LIDOCAINE 2% (20 MG/ML) 5 ML SYRINGE
INTRAMUSCULAR | Status: AC
  Filled 2019-09-23: qty 5

## 2019-09-23 MED ORDER — CEFAZOLIN SODIUM-DEXTROSE 2-4 GM/100ML-% IV SOLN
INTRAVENOUS | Status: AC
  Filled 2019-09-23: qty 100

## 2019-09-23 MED ORDER — PROPOFOL 500 MG/50ML IV EMUL
INTRAVENOUS | Status: AC
  Filled 2019-09-23: qty 50

## 2019-09-23 MED ORDER — FENTANYL CITRATE (PF) 100 MCG/2ML IJ SOLN: 25.0000 ug | INTRAMUSCULAR | Status: DC | PRN

## 2019-09-23 MED ORDER — LACTATED RINGERS IV SOLN: INTRAVENOUS | Status: DC

## 2019-09-23 MED ORDER — EPHEDRINE 5 MG/ML INJ
INTRAVENOUS | Status: AC
  Filled 2019-09-23: qty 10

## 2019-09-23 MED ORDER — PROPOFOL 10 MG/ML IV BOLUS
INTRAVENOUS | Status: DC | PRN
  Administered 2019-09-23: 30 mg via INTRAVENOUS
  Administered 2019-09-23 (×2): 20 mg via INTRAVENOUS

## 2019-09-23 MED ORDER — ONDANSETRON HCL 4 MG/2ML IJ SOLN
INTRAMUSCULAR | Status: DC | PRN
  Administered 2019-09-23: 4 mg via INTRAVENOUS

## 2019-09-23 MED ORDER — LIDOCAINE HCL (PF) 0.5 % IJ SOLN
INTRAMUSCULAR | Status: DC | PRN
  Administered 2019-09-23: 30 mL via INTRAVENOUS

## 2019-09-23 MED ORDER — MIDAZOLAM HCL 2 MG/2ML IJ SOLN
1.0000 mg | INTRAMUSCULAR | Status: DC | PRN
  Administered 2019-09-23: 1 mg via INTRAVENOUS

## 2019-09-23 MED ORDER — ONDANSETRON HCL 4 MG/2ML IJ SOLN
INTRAMUSCULAR | Status: AC
  Filled 2019-09-23: qty 2

## 2019-09-23 MED ORDER — PROMETHAZINE HCL 25 MG/ML IJ SOLN: 6.2500 mg | INTRAMUSCULAR | Status: DC | PRN

## 2019-09-23 MED ORDER — TRAMADOL HCL 50 MG PO TABS: 50.0000 mg | ORAL_TABLET | Freq: Four times a day (QID) | ORAL | 0 refills | Status: DC | PRN

## 2019-09-23 MED ORDER — FENTANYL CITRATE (PF) 100 MCG/2ML IJ SOLN
50.0000 ug | INTRAMUSCULAR | Status: DC | PRN
  Administered 2019-09-23: 50 ug via INTRAVENOUS

## 2019-09-23 MED ORDER — CHLORHEXIDINE GLUCONATE 4 % EX LIQD: 60.0000 mL | Freq: Once | CUTANEOUS | Status: DC

## 2019-09-23 MED ORDER — BUPIVACAINE HCL (PF) 0.25 % IJ SOLN
INTRAMUSCULAR | Status: DC | PRN
  Administered 2019-09-23: 4 mL

## 2019-09-23 MED ORDER — SUCCINYLCHOLINE CHLORIDE 200 MG/10ML IV SOSY
PREFILLED_SYRINGE | INTRAVENOUS | Status: AC
  Filled 2019-09-23: qty 10

## 2019-09-23 MED ORDER — MIDAZOLAM HCL 2 MG/2ML IJ SOLN
INTRAMUSCULAR | Status: AC
  Filled 2019-09-23: qty 2

## 2019-09-23 MED ORDER — OXYCODONE HCL 5 MG/5ML PO SOLN: 5.0000 mg | Freq: Once | ORAL | Status: DC | PRN

## 2019-09-23 SURGICAL SUPPLY — 38 items
APL PRP STRL LF DISP 70% ISPRP (MISCELLANEOUS) ×1
BLADE SURG 15 STRL LF DISP TIS (BLADE) ×1 IMPLANT
BLADE SURG 15 STRL SS (BLADE) ×3
BNDG CMPR 9X4 STRL LF SNTH (GAUZE/BANDAGES/DRESSINGS)
BNDG COHESIVE 2X5 TAN STRL LF (GAUZE/BANDAGES/DRESSINGS) ×3 IMPLANT
BNDG ESMARK 4X9 LF (GAUZE/BANDAGES/DRESSINGS) IMPLANT
CHLORAPREP W/TINT 26 (MISCELLANEOUS) ×3 IMPLANT
CORD BIPOLAR FORCEPS 12FT (ELECTRODE) IMPLANT
COVER BACK TABLE 60X90IN (DRAPES) ×3 IMPLANT
COVER MAYO STAND STRL (DRAPES) ×3 IMPLANT
COVER WAND RF STERILE (DRAPES) IMPLANT
CUFF TOURN SGL QUICK 18X4 (TOURNIQUET CUFF) ×3 IMPLANT
DECANTER SPIKE VIAL GLASS SM (MISCELLANEOUS) IMPLANT
DRAPE EXTREMITY T 121X128X90 (DISPOSABLE) ×3 IMPLANT
DRAPE SURG 17X23 STRL (DRAPES) ×3 IMPLANT
GAUZE SPONGE 4X4 12PLY STRL (GAUZE/BANDAGES/DRESSINGS) ×3 IMPLANT
GAUZE XEROFORM 1X8 LF (GAUZE/BANDAGES/DRESSINGS) ×3 IMPLANT
GLOVE BIO SURGEON STRL SZ 6.5 (GLOVE) ×4 IMPLANT
GLOVE BIO SURGEONS STRL SZ 6.5 (GLOVE) ×2
GLOVE BIOGEL M STRL SZ7.5 (GLOVE) ×3 IMPLANT
GLOVE BIOGEL PI IND STRL 6.5 (GLOVE) ×1 IMPLANT
GLOVE BIOGEL PI IND STRL 8.5 (GLOVE) ×1 IMPLANT
GLOVE BIOGEL PI INDICATOR 6.5 (GLOVE) ×2
GLOVE BIOGEL PI INDICATOR 8.5 (GLOVE) ×2
GLOVE SURG ORTHO 8.0 STRL STRW (GLOVE) ×3 IMPLANT
GLOVE SURG SS PI 6.0 STRL IVOR (GLOVE) ×3 IMPLANT
GOWN STRL REUS W/ TWL LRG LVL3 (GOWN DISPOSABLE) ×3 IMPLANT
GOWN STRL REUS W/TWL LRG LVL3 (GOWN DISPOSABLE) ×9
GOWN STRL REUS W/TWL XL LVL3 (GOWN DISPOSABLE) ×3 IMPLANT
NEEDLE PRECISIONGLIDE 27X1.5 (NEEDLE) ×3 IMPLANT
NS IRRIG 1000ML POUR BTL (IV SOLUTION) ×3 IMPLANT
PACK BASIN DAY SURGERY FS (CUSTOM PROCEDURE TRAY) ×3 IMPLANT
STOCKINETTE 4X48 STRL (DRAPES) ×3 IMPLANT
SUT ETHILON 4 0 PS 2 18 (SUTURE) ×3 IMPLANT
SYR BULB 3OZ (MISCELLANEOUS) ×3 IMPLANT
SYR CONTROL 10ML LL (SYRINGE) ×3 IMPLANT
TOWEL GREEN STERILE FF (TOWEL DISPOSABLE) ×6 IMPLANT
UNDERPAD 30X36 HEAVY ABSORB (UNDERPADS AND DIAPERS) ×3 IMPLANT

## 2019-09-23 NOTE — Op Note (Signed)
NAME: Nancy [name] MEDICAL RECORD NO: [id number] DATE OF BIRTH: [birth year] FACILITY: [facility] LOCATION: [facility] PHYSICIAN: Wynonia Sours, MD   OPERATIVE REPORT   DATE OF PROCEDURE: 09/23/19    PREOPERATIVE DIAGNOSIS:   Stenosing tenosynovitis right thumb   POSTOPERATIVE DIAGNOSIS:   Same   PROCEDURE:   Release A1 pulley right thumb   SURGEON: Daryll Brod, M.D.   ASSISTANT: Leverne Humbles, [facility]   ANESTHESIA:  Bier block with sedation and Local   INTRAVENOUS FLUIDS:  Per anesthesia flow sheet.   ESTIMATED BLOOD LOSS:  Minimal.   COMPLICATIONS:  None.   SPECIMENS:  none   TOURNIQUET TIME:    Total Tourniquet Time Documented: Forearm (Right) - 17 minutes Total: Forearm (Right) - 17 minutes    DISPOSITION:  Stable to PACU.   INDICATIONS: Patient is a 54 year old female status post suspension plasty who is developed triggering of her right thumb.  She has undergone release on her left side.  She is desirous of proceeding to have surgically released for going conservative treatment.  She states it did not work on her left side.  Preperi-and postoperative course been discussed along with risks and complications.  She is aware that there is no guarantee to the surgery the possibility of infection recurrence injury to arteries nerves tendons incomplete relief of symptoms and dystrophy.  In the preoperative area the patient is seen the extremity marked by both patient and surgeon antibiotic given  OPERATIVE COURSE: Patient is brought to the operating room where is placed in the supine position prepped and draped using ChloraPrep.  3-minute dry time was allowed timeout taken to confirm patient procedure.  This was done after a forearm-based IV regional anesthetic was carried out without difficulty under the direction of the anesthesia department.  A transverse incision was made over the A1 pulley of the right thumb carried down through subcutaneous tissue.   Neurovascular structures were identified protected retractors placed the A1 pulley was found to be markedly thickened post.  This was released on its radial aspect preserving the oblique pulley.  Tenosynovial tissue proximally was separated with blunt dissection.  The thumb placed through a full range of motion no further triggering was noted.  The wound was copiously irrigated with saline and closed with interrupted 4-0 nylon sutures.  A local infiltration with quarter percent bupivacaine without epinephrine was given approximately 5 cc was used.  A sterile compressive dressing with the fingers free was applied.  Deflation of the tourniquet all fingers immediately pink.  She was taken to the recovery room for observation in satisfactory condition.  She will be discharged home to return [place] in 1 week on Tylenol ibuprofen for pain with tramadol for breakthrough.   Daryll Brod, MD Electronically signed, 09/23/19

## 2019-09-23 NOTE — Transfer of Care (Signed)
Immediate Anesthesia Transfer of Care Note  Patient: Nancy [name]  Procedure(s) Performed: RIGHT THUMB RELEASE TRIGGER FINGER/A-1 PULLEY (Right Arm Lower)  Patient Location: PACU  Anesthesia Type:MAC and Bier block  Level of Consciousness: awake, alert  and oriented  Airway & Oxygen Therapy: Patient Spontanous Breathing and Patient connected to face mask oxygen  Post-op Assessment: Report given to RN and Post -op Vital signs reviewed and stable  Post vital signs: Reviewed and stable  Last Vitals:  Vitals Value Taken Time  BP    Temp    Pulse    Resp    SpO2      Last Pain:  Vitals:   09/23/19 0832  TempSrc: Oral  PainSc: 5       Patients Stated Pain Goal: 5 (29/79/89 2119)  Complications: No apparent anesthesia complications

## 2019-09-23 NOTE — Anesthesia Postprocedure Evaluation (Signed)
Anesthesia Post Note  Patient: Nancy [name]  Procedure(s) Performed: RIGHT THUMB RELEASE TRIGGER FINGER/A-1 PULLEY (Right Arm Lower)     Patient location during evaluation: PACU Anesthesia Type: Bier Block Level of consciousness: awake and alert Pain management: pain level controlled Vital Signs Assessment: post-procedure vital signs reviewed and stable Respiratory status: spontaneous breathing, nonlabored ventilation and respiratory function stable Cardiovascular status: stable and blood pressure returned to baseline Anesthetic complications: no    Last Vitals:  Vitals:   09/23/19 1045 09/23/19 1100  BP: 140/89 (!) 111/55  Pulse: 61 63  Resp: 12 16  Temp:  36.6 C  SpO2: 98% 98%    Last Pain:  Vitals:   09/23/19 1100  TempSrc:   PainSc: 0-No pain                 Audry Pili

## 2019-09-23 NOTE — Anesthesia Preprocedure Evaluation (Addendum)
Anesthesia Evaluation  Patient identified by MRN, date of birth, ID band Patient awake    Reviewed: Allergy & Precautions, NPO status , Patient's Chart, lab work & pertinent test results  History of Anesthesia Complications (+) PONV and history of anesthetic complications  Airway Mallampati: I  TM Distance: >3 FB Neck ROM: Full    Dental  (+) Dental Advisory Given, Teeth Intact   Pulmonary neg pulmonary ROS,    Pulmonary exam normal        Cardiovascular negative cardio ROS Normal cardiovascular exam     Neuro/Psych negative neurological ROS  negative psych ROS   GI/Hepatic negative GI ROS, Neg liver ROS,   Endo/Other  diabetes, Type 2, Oral Hypoglycemic AgentsHypothyroidism  Obesity   Renal/GU negative Renal ROS     Musculoskeletal negative musculoskeletal ROS (+)   Abdominal   Peds  Hematology negative hematology ROS (+)   Anesthesia Other Findings Covid + 08/13/19   Reproductive/Obstetrics                            Anesthesia Physical Anesthesia Plan  ASA: II  Anesthesia Plan: Bier Block and Bier Block-LIDOCAINE ONLY   Post-op Pain Management:    Induction: Intravenous  PONV Risk Score and Plan: 3 and Propofol infusion and Treatment may vary due to age or medical condition  Airway Management Planned: Natural Airway and Simple Face Mask  Additional Equipment: None  Intra-op Plan:   Post-operative Plan:   Informed Consent: I have reviewed the patients History and Physical, chart, labs and discussed the procedure including the risks, benefits and alternatives for the proposed anesthesia with the patient or authorized representative who has indicated his/her understanding and acceptance.       Plan Discussed with: CRNA and Anesthesiologist  Anesthesia Plan Comments:       Anesthesia Quick Evaluation

## 2019-09-23 NOTE — H&P (Signed)
Nancy [name] is an 54 y.o. female.   Chief Complaint: Catching right thumb HPI: Nancy [name] is a 54 yo femalecomplaining of triggering of her right thumb.  She is status post bilateral suspension plasty's with Microlink on the right side on 10/03/2018. She has had similar procedure on the left side in 2019. This began at the beginning of January. She has no history of injury. It causes only mild discomfort. She has undergone a release of the A1 pulleyon her left side..   Past Medical History:  Diagnosis Date  . Back pain   . Complication of anesthesia   . Diabetes mellitus   . Hypothyroidism   . PONV (postoperative nausea and vomiting)   . Rosacea   . Small bowel obstruction ([place])   . Thyroid disease   . Ventral hernia     Past Surgical History:  Procedure Laterality Date  . BACK SURGERY     x3; fusion with rods and screws  . carpatunnel surgery Bilateral   . CARPOMETACARPEL SUSPENSION PLASTY Left 05/23/2018   Procedure: CARPOMETACARPEL [facility]) SUSPENSION PLASTY LEFT THUMB;  Surgeon: Daryll Brod, MD;  Location: [place];  Service: Orthopedics;  Laterality: Left;  . CARPOMETACARPEL SUSPENSION PLASTY Right 10/03/2018   Procedure: RIGHT THUMB SUSPENSONPLASTY WITH MICRO LINK SUTURE AND TRAPEXIUM EXCISION;  Surgeon: Daryll Brod, MD;  Location: [place];  Service: Orthopedics;  Laterality: Right;  AXILLARY BLOCK  . CHOLECYSTECTOMY    . COLON SURGERY    . COLOSTOMY    . ENDOMETRIAL ABLATION    . INSERTION OF MESH  08/03/2012   Procedure: INSERTION OF MESH;  Surgeon: Gwenyth Ober, MD;  Location: [place];  Service: General;  Laterality: N/A;  . LAPAROSCOPIC BILATERAL SALPINGO OOPHERECTOMY Bilateral 05/12/2015   Procedure: LAPAROSCOPIC BILATERAL SALPINGO OOPHORECTOMY;  Surgeon: Florian Buff, MD;  Location: [facility];  Service: Gynecology;  Laterality: Bilateral;  . LAPAROSCOPIC LYSIS OF ADHESIONS Bilateral 05/12/2015   Procedure: EXTENSIVE LAPAROSCOPIC LYSIS OF  ADHESIONS;  Surgeon: Florian Buff, MD;  Location: [facility];  Service: Gynecology;  Laterality: Bilateral;  . LAPAROTOMY  08/03/2012   Procedure: EXPLORATORY LAPAROTOMY;  Surgeon: Gwenyth Ober, MD;  Location: [place];  Service: General;  Laterality: N/A;  with extensive enterolysis  . LIVER BIOPSY    . REVISION COLOSTOMY    . TENDON TRANSFER Left 05/23/2018   Procedure: TENDON TRANSFER WITH TRAPEZIUM EXCISION;  Surgeon: Daryll Brod, MD;  Location: [place];  Service: Orthopedics;  Laterality: Left;  . TRIGGER FINGER RELEASE Left 05/23/2018   Procedure: RELEASE TRIGGER FINGER/A-1 PULLEY LEFT THUMB;  Surgeon: Daryll Brod, MD;  Location: [place];  Service: Orthopedics;  Laterality: Left;  Marland Kitchen VENTRAL HERNIA REPAIR  08/03/2012   Procedure: HERNIA REPAIR VENTRAL ADULT;  Surgeon: Gwenyth Ober, MD;  Location: [place];  Service: General;  Laterality: N/A;  . VENTRAL HERNIA REPAIR  03/13/2016   Procedure: INCISIONAL HERNIA REPAIR WITH MESH;  Surgeon: Aviva Signs, MD;  Location: [facility];  Service: General;;    Family History  Problem Relation Age of Onset  . Thyroid disease Mother   . Heart disease Father   . Diabetes Mellitus I Maternal Grandmother   . Diabetes Mellitus I Maternal Grandfather   . Diabetes Mellitus I Paternal Grandmother   . Diabetes Mellitus I Paternal Grandfather    Social History:  reports that she has never smoked. She has never used smokeless tobacco. She reports that she does  not drink alcohol or use drugs.  Allergies:  Allergies  Allergen Reactions  . Chocolate Other (See Comments)    rosacea     No medications prior to admission.    No results found for this or any previous visit (from the past 48 hour(s)).  No results found.   Pertinent items are noted in HPI.  Height 5\' 6"  (1.676 m), weight 102.1 kg.  General appearance: alert, cooperative and appears stated age Head: Normocephalic, without obvious abnormality Neck: no  JVD Resp: clear to auscultation bilaterally Cardio: regular rate and rhythm, S1, S2 normal, no murmur, click, rub or gallop GI: soft, non-tender; bowel sounds normal; no masses,  no organomegaly Extremities: Catching right thumb Pulses: 2+ and symmetric Skin: Skin color, texture, turgor normal. No rashes or lesions Neurologic: Grossly normal Incision/Wound: na  Assessment/Plan Diagnosis stenosing tenosynovitis right thumb  Plan: We have discussed possibility of injection. She states it did not work on her left side. She would like to just proceed to have this surgically released. Preperi-and postoperative course been discussed along with risk and complications. She is aware there is no guarantee to the surgery the possibility of infection recurrence injury to arteries nerves tendons incomplete relief symptoms and dystrophy. She is scheduled for release A1 pulley right thumb as an outpatient under regional anesthesia. She does have history of diabetes.    Daryll Brod 09/23/2019, 5:40 AM

## 2019-09-23 NOTE — Discharge Instructions (Addendum)

## 2019-09-23 NOTE — Brief Op Note (Signed)
09/23/2019  10:28 AM  PATIENT:  Nancy [name]  54 y.o. female  PRE-OPERATIVE DIAGNOSIS:  right thumb trigger finger  POST-OPERATIVE DIAGNOSIS:  right thumb trigger finger  PROCEDURE:  Procedure(s) with comments: RIGHT THUMB RELEASE TRIGGER FINGER/A-1 PULLEY (Right) - IV REGIONAL FOREARM BLOCK  SURGEON:  Surgeon(s) and Role:    Daryll Brod, MD - Primary  PHYSICIAN ASSISTANT:   ASSISTANTS: Leverne Humbles PA-C  ANESTHESIA:   local, regional and IV sedation  EBL:  1 mL  BLOOD ADMINISTERED:none  DRAINS: none   LOCAL MEDICATIONS USED:  BUPIVICAINE   SPECIMEN:  No Specimen  DISPOSITION OF SPECIMEN:  N/A  COUNTS:  YES  TOURNIQUET:   Total Tourniquet Time Documented: Forearm (Right) - 17 minutes Total: Forearm (Right) - 17 minutes   DICTATION: .Viviann Spare Dictation  PLAN OF CARE: Discharge to home after PACU  PATIENT DISPOSITION:  PACU - hemodynamically stable.

## 2019-09-24 ENCOUNTER — Encounter: Payer: Self-pay | Admitting: *Deleted

## 2019-10-15 DIAGNOSIS — E1139 Type 2 diabetes mellitus with other diabetic ophthalmic complication: Secondary | ICD-10-CM | POA: Diagnosis not present

## 2019-10-15 DIAGNOSIS — E1140 Type 2 diabetes mellitus with diabetic neuropathy, unspecified: Secondary | ICD-10-CM | POA: Diagnosis not present

## 2019-11-06 DIAGNOSIS — E1149 Type 2 diabetes mellitus with other diabetic neurological complication: Secondary | ICD-10-CM | POA: Diagnosis not present

## 2019-11-06 DIAGNOSIS — U071 COVID-19: Secondary | ICD-10-CM | POA: Diagnosis not present

## 2019-11-11 DIAGNOSIS — Z23 Encounter for immunization: Secondary | ICD-10-CM | POA: Diagnosis not present

## 2019-12-09 ENCOUNTER — Encounter: Payer: Self-pay | Admitting: Obstetrics & Gynecology

## 2019-12-09 ENCOUNTER — Other Ambulatory Visit (HOSPITAL_COMMUNITY)
Admission: RE | Admit: 2019-12-09 | Discharge: 2019-12-09 | Disposition: A | Discharge status: Home / Self Care | Payer: Medicare Other | Source: Ambulatory Visit | Attending: Obstetrics & Gynecology | Admitting: Obstetrics & Gynecology

## 2019-12-09 ENCOUNTER — Ambulatory Visit (INDEPENDENT_AMBULATORY_CARE_PROVIDER_SITE_OTHER): Payer: Medicare Other | Admitting: Obstetrics & Gynecology

## 2019-12-09 ENCOUNTER — Other Ambulatory Visit: Payer: Self-pay

## 2019-12-09 VITALS — BP 129/69 | HR 65 | Ht 67.0 in | Wt 231.0 lb

## 2019-12-09 DIAGNOSIS — Z124 Encounter for screening for malignant neoplasm of cervix: Secondary | ICD-10-CM | POA: Diagnosis not present

## 2019-12-09 DIAGNOSIS — Z01419 Encounter for gynecological examination (general) (routine) without abnormal findings: Secondary | ICD-10-CM | POA: Diagnosis not present

## 2019-12-09 DIAGNOSIS — Z1151 Encounter for screening for human papillomavirus (HPV): Secondary | ICD-10-CM | POA: Diagnosis not present

## 2019-12-09 DIAGNOSIS — Z23 Encounter for immunization: Secondary | ICD-10-CM | POA: Diagnosis not present

## 2019-12-09 NOTE — Progress Notes (Signed)
Subjective:     Nancy [name] is a 54 y.o. female here for a routine exam.  No LMP recorded. Patient has had an ablation. G0P0000 Birth Control Method:  Post menopausal Menstrual Calendar(currently): amenorrheic  Current complaints: none.  Current acute medical issues:  Diabetes hypertension   Recent Gynecologic History No LMP recorded. Patient has had an ablation. Last Pap: 11/2017,  normal Last mammogram: 6/20,  normal  Past Medical History:  Diagnosis Date  . Back pain   . Complication of anesthesia   . Diabetes mellitus   . Hypothyroidism   . PONV (postoperative nausea and vomiting)   . Rosacea   . Small bowel obstruction ([place])   . Thyroid disease   . Ventral hernia     Past Surgical History:  Procedure Laterality Date  . BACK SURGERY     x3; fusion with rods and screws  . carpatunnel surgery Bilateral   . CARPOMETACARPEL SUSPENSION PLASTY Left 05/23/2018   Procedure: CARPOMETACARPEL [facility]) SUSPENSION PLASTY LEFT THUMB;  Surgeon: Daryll Brod, MD;  Location: [place];  Service: Orthopedics;  Laterality: Left;  . CARPOMETACARPEL SUSPENSION PLASTY Right 10/03/2018   Procedure: RIGHT THUMB SUSPENSONPLASTY WITH MICRO LINK SUTURE AND TRAPEXIUM EXCISION;  Surgeon: Daryll Brod, MD;  Location: [place];  Service: Orthopedics;  Laterality: Right;  AXILLARY BLOCK  . CHOLECYSTECTOMY    . COLON SURGERY    . COLOSTOMY    . ENDOMETRIAL ABLATION    . INSERTION OF MESH  08/03/2012   Procedure: INSERTION OF MESH;  Surgeon: Gwenyth Ober, MD;  Location: [place];  Service: General;  Laterality: N/A;  . LAPAROSCOPIC BILATERAL SALPINGO OOPHERECTOMY Bilateral 05/12/2015   Procedure: LAPAROSCOPIC BILATERAL SALPINGO OOPHORECTOMY;  Surgeon: Florian Buff, MD;  Location: [facility];  Service: Gynecology;  Laterality: Bilateral;  . LAPAROSCOPIC LYSIS OF ADHESIONS Bilateral 05/12/2015   Procedure: EXTENSIVE LAPAROSCOPIC LYSIS OF ADHESIONS;  Surgeon: Florian Buff, MD;   Location: [facility];  Service: Gynecology;  Laterality: Bilateral;  . LAPAROTOMY  08/03/2012   Procedure: EXPLORATORY LAPAROTOMY;  Surgeon: Gwenyth Ober, MD;  Location: [place];  Service: General;  Laterality: N/A;  with extensive enterolysis  . LIVER BIOPSY    . REVISION COLOSTOMY    . TENDON TRANSFER Left 05/23/2018   Procedure: TENDON TRANSFER WITH TRAPEZIUM EXCISION;  Surgeon: Daryll Brod, MD;  Location: [place];  Service: Orthopedics;  Laterality: Left;  . TRIGGER FINGER RELEASE Left 05/23/2018   Procedure: RELEASE TRIGGER FINGER/A-1 PULLEY LEFT THUMB;  Surgeon: Daryll Brod, MD;  Location: [place];  Service: Orthopedics;  Laterality: Left;  . TRIGGER FINGER RELEASE Right 09/23/2019   Procedure: RIGHT THUMB RELEASE TRIGGER FINGER/A-1 PULLEY;  Surgeon: Daryll Brod, MD;  Location: [place];  Service: Orthopedics;  Laterality: Right;  IV REGIONAL FOREARM BLOCK  . VENTRAL HERNIA REPAIR  08/03/2012   Procedure: HERNIA REPAIR VENTRAL ADULT;  Surgeon: Gwenyth Ober, MD;  Location: [place];  Service: General;  Laterality: N/A;  . VENTRAL HERNIA REPAIR  03/13/2016   Procedure: INCISIONAL HERNIA REPAIR WITH MESH;  Surgeon: Aviva Signs, MD;  Location[facility];  Service: General;;    OB History    Gravida  0   Para  0   Term  0   Preterm  0   AB  0   Living  0     SAB  0   TAB  0   Ectopic  0  Multiple  0   Live Births  0           Social History   Socioeconomic History  . Marital status: Married    Spouse name: Not on file  . Number of children: Not on file  . Years of education: Not on file  . Highest education level: Not on file  Occupational History  . Not on file  Tobacco Use  . Smoking status: Never Smoker  . Smokeless tobacco: Never Used  Substance and Sexual Activity  . Alcohol use: No    Alcohol/week: 0.0 standard drinks  . Drug use: No  . Sexual activity: Yes    Birth control/protection: Surgical  Other  Topics Concern  . Not on file  Social History Narrative  . Not on file   Social Determinants of Health   Financial Resource Strain: Low Risk   . Difficulty of Paying Living Expenses: Not very hard  Food Insecurity: No Food Insecurity  . Worried About Charity fundraiser in the Last Year: Never true  . Ran Out of Food in the Last Year: Never true  Transportation Needs: No Transportation Needs  . Lack of Transportation (Medical): No  . Lack of Transportation (Non-Medical): No  Physical Activity: Sufficiently Active  . Days of Exercise per Week: 5 days  . Minutes of Exercise per Session: 30 min  Stress: No Stress Concern Present  . Feeling of Stress : Not at all  Social Connections: Slightly Isolated  . Frequency of Communication with Friends and Family: More than three times a week  . Frequency of Social Gatherings with Friends and Family: Once a week  . Attends Religious Services: More than 4 times per year  . Active Member of Clubs or Organizations: No  . Attends Archivist Meetings: Never  . Marital Status: Married    Family History  Problem Relation Age of Onset  . Thyroid disease Mother   . Heart disease Father   . Diabetes Mellitus I Maternal Grandmother   . Diabetes Mellitus I Maternal Grandfather   . Diabetes Mellitus I Paternal Grandmother   . Diabetes Mellitus I Paternal Grandfather      Current Outpatient Medications:  .  aspirin 81 MG EC tablet, Take 81 mg by mouth every evening. , Disp: , Rfl:  .  atorvastatin (LIPITOR) 10 MG tablet, Take 10 mg by mouth every other day. , Disp: , Rfl:  .  Canagliflozin (INVOKANA) 300 MG TABS, Take 150 mg by mouth every morning. Before breakfast , Disp: , Rfl:  .  Cholecalciferol 5000 UNITS capsule, Take 5,000 Units by mouth every morning. Vitamin D, Disp: , Rfl:  .  doxycycline (VIBRA-TABS) 100 MG tablet, Take 100 mg by mouth as needed. , Disp: , Rfl:  .  enalapril (VASOTEC) 5 MG tablet, Take 5 mg by mouth at  bedtime.  , Disp: , Rfl:  .  insulin NPH Human (NOVOLIN N) 100 UNIT/ML injection, Inject into the skin. TAKE 55U QAM, 50U QPM, Disp: , Rfl:  .  levothyroxine (SYNTHROID, LEVOTHROID) 150 MCG tablet, Take 137 mcg by mouth daily before breakfast. , Disp: , Rfl:  .  Liraglutide (VICTOZA) 18 MG/3ML SOLN, Inject 1.2 mg into the skin daily. , Disp: , Rfl:  .  metFORMIN (GLUCOPHAGE) 1000 MG tablet, Take 1 tablet (1,000 mg total) by mouth 2 (two) times daily., Disp: , Rfl:  .  methocarbamol (ROBAXIN) 500 MG tablet, Take 500 mg by mouth every 6 (six)  hours as needed for muscle spasms., Disp: , Rfl:  .  MetroNIDAZOLE-Cleanser 0.75 % (Cream) KIT, Apply 1 application topically as needed. To face for rosacea , Disp: , Rfl:  .  Multiple Vitamin (MULTIVITAMIN) tablet, Take 1 tablet by mouth daily., Disp: , Rfl:  .  Probiotic Product (PROBIOTIC COLON SUPPORT) CAPS, Take by mouth., Disp: , Rfl:   Review of Systems  Review of Systems  Constitutional: Negative for fever, chills, weight loss, malaise/fatigue and diaphoresis.  HENT: Negative for hearing loss, ear pain, nosebleeds, congestion, sore throat, neck pain, tinnitus and ear discharge.   Eyes: Negative for blurred vision, double vision, photophobia, pain, discharge and redness.  Respiratory: Negative for cough, hemoptysis, sputum production, shortness of breath, wheezing and stridor.   Cardiovascular: Negative for chest pain, palpitations, orthopnea, claudication, leg swelling and PND.  Gastrointestinal: negative for abdominal pain. Negative for heartburn, nausea, vomiting, diarrhea, constipation, blood in stool and melena.  Genitourinary: Negative for dysuria, urgency, frequency, hematuria and flank pain.  Musculoskeletal: Negative for myalgias, back pain, joint pain and falls.  Skin: Negative for itching and rash.  Neurological: Negative for dizziness, tingling, tremors, sensory change, speech change, focal weakness, seizures, loss of consciousness,  weakness and headaches.  Endo/Heme/Allergies: Negative for environmental allergies and polydipsia. Does not bruise/bleed easily.  Psychiatric/Behavioral: Negative for depression, suicidal ideas, hallucinations, memory loss and substance abuse. The patient is not nervous/anxious and does not have insomnia.        Objective:  Blood pressure 129/69, pulse 65, height _0  (1.702 m), weight 231 lb (104.8 kg).   Physical Exam  Vitals reviewed. Constitutional: She is oriented to person, place, and time. She appears well-developed and well-nourished.  HENT:  Head: Normocephalic and atraumatic.        Right Ear: External ear normal.  Left Ear: External ear normal.  Nose: Nose normal.  Mouth/Throat: Oropharynx is clear and moist.  Eyes: Conjunctivae and EOM are normal. Pupils are equal, round, and reactive to light. Right eye exhibits no discharge. Left eye exhibits no discharge. No scleral icterus.  Neck: Normal range of motion. Neck supple. No tracheal deviation present. No thyromegaly present.  Cardiovascular: Normal rate, regular rhythm, normal heart sounds and intact distal pulses.  Exam reveals no gallop and no friction rub.   No murmur heard. Respiratory: Effort normal and breath sounds normal. No respiratory distress. She has no wheezes. She has no rales. She exhibits no tenderness.  GI: Soft. Bowel sounds are normal. She exhibits no distension and no mass. There is no tenderness. There is no rebound and no guarding.  Genitourinary:  Breasts no masses skin changes or nipple changes bilaterally      Vulva is normal without lesions Vagina is pink moist without discharge Cervix normal in appearance and pap is done Uterus is normal size shape and contour Adnexa is negative with normal sized ovaries  {Rectal    hemoccult negative, normal tone, no masses  Musculoskeletal: Normal range of motion. She exhibits no edema and no tenderness.  Neurological: She is alert and oriented to person,  place, and time. She has normal reflexes. She displays normal reflexes. No cranial nerve deficit. She exhibits normal muscle tone. Coordination normal.  Skin: Skin is warm and dry. No rash noted. No erythema. No pallor.  Psychiatric: She has a normal mood and affect. Her behavior is normal. Judgment and thought content normal.       Medications Ordered at today's visit: No orders of the defined types were placed  in this encounter.   Other orders placed at today's visit: No orders of the defined types were placed in this encounter.     Assessment:    Normal Gyn exam.    Plan:    Contraception: none. Mammogram ordered. Follow up in: 2 years.     Return in about 2 years (around 12/08/2021) for yearly, with Dr Elonda Husky.

## 2019-12-11 LAB — CYTOLOGY - PAP
Comment: NEGATIVE
Diagnosis: NEGATIVE
High risk HPV: NEGATIVE

## 2019-12-22 ENCOUNTER — Other Ambulatory Visit (HOSPITAL_COMMUNITY): Payer: Self-pay | Admitting: Obstetrics & Gynecology

## 2019-12-22 DIAGNOSIS — Z1231 Encounter for screening mammogram for malignant neoplasm of breast: Secondary | ICD-10-CM

## 2020-01-26 ENCOUNTER — Other Ambulatory Visit: Payer: Self-pay

## 2020-01-26 ENCOUNTER — Ambulatory Visit (HOSPITAL_COMMUNITY)
Admission: RE | Admit: 2020-01-26 | Discharge: 2020-01-26 | Disposition: A | Discharge status: Home / Self Care | Payer: Medicare Other | Source: Ambulatory Visit | Attending: Obstetrics & Gynecology | Admitting: Obstetrics & Gynecology

## 2020-01-26 DIAGNOSIS — Z1231 Encounter for screening mammogram for malignant neoplasm of breast: Secondary | ICD-10-CM | POA: Diagnosis not present

## 2020-03-12 ENCOUNTER — Other Ambulatory Visit: Payer: Self-pay | Admitting: Internal Medicine

## 2020-03-12 DIAGNOSIS — K869 Disease of pancreas, unspecified: Secondary | ICD-10-CM

## 2020-03-22 DIAGNOSIS — M7711 Lateral epicondylitis, right elbow: Secondary | ICD-10-CM | POA: Diagnosis not present

## 2020-03-22 DIAGNOSIS — M25521 Pain in right elbow: Secondary | ICD-10-CM | POA: Diagnosis not present

## 2020-03-24 DIAGNOSIS — R531 Weakness: Secondary | ICD-10-CM | POA: Diagnosis not present

## 2020-03-24 DIAGNOSIS — S59901D Unspecified injury of right elbow, subsequent encounter: Secondary | ICD-10-CM | POA: Diagnosis not present

## 2020-03-24 DIAGNOSIS — M79631 Pain in right forearm: Secondary | ICD-10-CM | POA: Diagnosis not present

## 2020-03-24 DIAGNOSIS — M25521 Pain in right elbow: Secondary | ICD-10-CM | POA: Diagnosis not present

## 2020-03-29 DIAGNOSIS — R531 Weakness: Secondary | ICD-10-CM | POA: Diagnosis not present

## 2020-03-29 DIAGNOSIS — S59901D Unspecified injury of right elbow, subsequent encounter: Secondary | ICD-10-CM | POA: Diagnosis not present

## 2020-03-29 DIAGNOSIS — M79631 Pain in right forearm: Secondary | ICD-10-CM | POA: Diagnosis not present

## 2020-03-29 DIAGNOSIS — M25521 Pain in right elbow: Secondary | ICD-10-CM | POA: Diagnosis not present

## 2020-03-31 DIAGNOSIS — R531 Weakness: Secondary | ICD-10-CM | POA: Diagnosis not present

## 2020-03-31 DIAGNOSIS — M25521 Pain in right elbow: Secondary | ICD-10-CM | POA: Diagnosis not present

## 2020-03-31 DIAGNOSIS — M79631 Pain in right forearm: Secondary | ICD-10-CM | POA: Diagnosis not present

## 2020-03-31 DIAGNOSIS — S59901D Unspecified injury of right elbow, subsequent encounter: Secondary | ICD-10-CM | POA: Diagnosis not present

## 2020-04-05 DIAGNOSIS — M25521 Pain in right elbow: Secondary | ICD-10-CM | POA: Diagnosis not present

## 2020-04-05 DIAGNOSIS — R531 Weakness: Secondary | ICD-10-CM | POA: Diagnosis not present

## 2020-04-05 DIAGNOSIS — M79631 Pain in right forearm: Secondary | ICD-10-CM | POA: Diagnosis not present

## 2020-04-05 DIAGNOSIS — S59901D Unspecified injury of right elbow, subsequent encounter: Secondary | ICD-10-CM | POA: Diagnosis not present

## 2020-04-07 DIAGNOSIS — M25521 Pain in right elbow: Secondary | ICD-10-CM | POA: Diagnosis not present

## 2020-04-07 DIAGNOSIS — S59901D Unspecified injury of right elbow, subsequent encounter: Secondary | ICD-10-CM | POA: Diagnosis not present

## 2020-04-07 DIAGNOSIS — R531 Weakness: Secondary | ICD-10-CM | POA: Diagnosis not present

## 2020-04-07 DIAGNOSIS — M79631 Pain in right forearm: Secondary | ICD-10-CM | POA: Diagnosis not present

## 2020-04-08 ENCOUNTER — Ambulatory Visit
Admission: RE | Admit: 2020-04-08 | Discharge: 2020-04-08 | Disposition: A | Discharge status: Home / Self Care | Payer: Medicare Other | Source: Ambulatory Visit | Attending: Internal Medicine | Admitting: Internal Medicine

## 2020-04-08 ENCOUNTER — Other Ambulatory Visit: Payer: Self-pay

## 2020-04-08 DIAGNOSIS — D3500 Benign neoplasm of unspecified adrenal gland: Secondary | ICD-10-CM | POA: Diagnosis not present

## 2020-04-08 DIAGNOSIS — K869 Disease of pancreas, unspecified: Secondary | ICD-10-CM

## 2020-04-08 DIAGNOSIS — K862 Cyst of pancreas: Secondary | ICD-10-CM | POA: Diagnosis not present

## 2020-04-08 DIAGNOSIS — K8689 Other specified diseases of pancreas: Secondary | ICD-10-CM | POA: Diagnosis not present

## 2020-04-08 DIAGNOSIS — K7689 Other specified diseases of liver: Secondary | ICD-10-CM | POA: Diagnosis not present

## 2020-04-08 MED ORDER — GADOBENATE DIMEGLUMINE 529 MG/ML IV SOLN
20.0000 mL | Freq: Once | INTRAVENOUS | Status: AC | PRN
  Administered 2020-04-08: 20 mL via INTRAVENOUS

## 2020-04-12 DIAGNOSIS — R531 Weakness: Secondary | ICD-10-CM | POA: Diagnosis not present

## 2020-04-12 DIAGNOSIS — M25521 Pain in right elbow: Secondary | ICD-10-CM | POA: Diagnosis not present

## 2020-04-12 DIAGNOSIS — S59901D Unspecified injury of right elbow, subsequent encounter: Secondary | ICD-10-CM | POA: Diagnosis not present

## 2020-04-12 DIAGNOSIS — M79631 Pain in right forearm: Secondary | ICD-10-CM | POA: Diagnosis not present

## 2020-04-14 DIAGNOSIS — R531 Weakness: Secondary | ICD-10-CM | POA: Diagnosis not present

## 2020-04-14 DIAGNOSIS — S59901D Unspecified injury of right elbow, subsequent encounter: Secondary | ICD-10-CM | POA: Diagnosis not present

## 2020-04-14 DIAGNOSIS — M25521 Pain in right elbow: Secondary | ICD-10-CM | POA: Diagnosis not present

## 2020-04-14 DIAGNOSIS — M79631 Pain in right forearm: Secondary | ICD-10-CM | POA: Diagnosis not present

## 2020-04-19 DIAGNOSIS — M65311 Trigger thumb, right thumb: Secondary | ICD-10-CM | POA: Diagnosis not present

## 2020-04-19 DIAGNOSIS — M180 Bilateral primary osteoarthritis of first carpometacarpal joints: Secondary | ICD-10-CM | POA: Diagnosis not present

## 2020-04-19 DIAGNOSIS — M7711 Lateral epicondylitis, right elbow: Secondary | ICD-10-CM | POA: Diagnosis not present

## 2020-04-19 DIAGNOSIS — M25521 Pain in right elbow: Secondary | ICD-10-CM | POA: Diagnosis not present

## 2020-05-19 DIAGNOSIS — E1129 Type 2 diabetes mellitus with other diabetic kidney complication: Secondary | ICD-10-CM | POA: Diagnosis not present

## 2020-05-24 DIAGNOSIS — Z23 Encounter for immunization: Secondary | ICD-10-CM | POA: Diagnosis not present

## 2020-05-26 DIAGNOSIS — I10 Essential (primary) hypertension: Secondary | ICD-10-CM | POA: Diagnosis not present

## 2020-05-26 DIAGNOSIS — E1122 Type 2 diabetes mellitus with diabetic chronic kidney disease: Secondary | ICD-10-CM | POA: Diagnosis not present

## 2020-07-19 DIAGNOSIS — M7711 Lateral epicondylitis, right elbow: Secondary | ICD-10-CM | POA: Diagnosis not present

## 2020-07-26 DIAGNOSIS — Z23 Encounter for immunization: Secondary | ICD-10-CM | POA: Diagnosis not present

## 2020-08-04 DIAGNOSIS — E119 Type 2 diabetes mellitus without complications: Secondary | ICD-10-CM | POA: Diagnosis not present

## 2020-08-18 DIAGNOSIS — E1129 Type 2 diabetes mellitus with other diabetic kidney complication: Secondary | ICD-10-CM | POA: Diagnosis not present

## 2020-08-25 DIAGNOSIS — R7309 Other abnormal glucose: Secondary | ICD-10-CM | POA: Diagnosis not present

## 2020-08-25 DIAGNOSIS — I10 Essential (primary) hypertension: Secondary | ICD-10-CM | POA: Diagnosis not present

## 2020-08-25 DIAGNOSIS — E1122 Type 2 diabetes mellitus with diabetic chronic kidney disease: Secondary | ICD-10-CM | POA: Diagnosis not present

## 2020-11-15 DIAGNOSIS — E1129 Type 2 diabetes mellitus with other diabetic kidney complication: Secondary | ICD-10-CM | POA: Diagnosis not present

## 2020-11-23 DIAGNOSIS — I10 Essential (primary) hypertension: Secondary | ICD-10-CM | POA: Diagnosis not present

## 2020-11-23 DIAGNOSIS — E1122 Type 2 diabetes mellitus with diabetic chronic kidney disease: Secondary | ICD-10-CM | POA: Diagnosis not present

## 2020-11-23 DIAGNOSIS — R7309 Other abnormal glucose: Secondary | ICD-10-CM | POA: Diagnosis not present

## 2020-12-24 ENCOUNTER — Other Ambulatory Visit (HOSPITAL_COMMUNITY): Payer: Self-pay | Admitting: Obstetrics & Gynecology

## 2020-12-24 DIAGNOSIS — Z1231 Encounter for screening mammogram for malignant neoplasm of breast: Secondary | ICD-10-CM

## 2021-01-26 ENCOUNTER — Ambulatory Visit (HOSPITAL_COMMUNITY)
Admission: RE | Admit: 2021-01-26 | Discharge: 2021-01-26 | Disposition: A | Discharge status: Home / Self Care | Payer: Medicare Other | Source: Ambulatory Visit | Attending: Obstetrics & Gynecology | Admitting: Obstetrics & Gynecology

## 2021-01-26 DIAGNOSIS — Z1231 Encounter for screening mammogram for malignant neoplasm of breast: Secondary | ICD-10-CM | POA: Insufficient documentation

## 2021-02-14 DIAGNOSIS — Z79899 Other long term (current) drug therapy: Secondary | ICD-10-CM | POA: Diagnosis not present

## 2021-02-14 DIAGNOSIS — E1129 Type 2 diabetes mellitus with other diabetic kidney complication: Secondary | ICD-10-CM | POA: Diagnosis not present

## 2021-02-14 DIAGNOSIS — E785 Hyperlipidemia, unspecified: Secondary | ICD-10-CM | POA: Diagnosis not present

## 2021-02-14 DIAGNOSIS — E039 Hypothyroidism, unspecified: Secondary | ICD-10-CM | POA: Diagnosis not present

## 2021-02-14 DIAGNOSIS — I10 Essential (primary) hypertension: Secondary | ICD-10-CM | POA: Diagnosis not present

## 2021-02-22 DIAGNOSIS — R7309 Other abnormal glucose: Secondary | ICD-10-CM | POA: Diagnosis not present

## 2021-02-22 DIAGNOSIS — E1122 Type 2 diabetes mellitus with diabetic chronic kidney disease: Secondary | ICD-10-CM | POA: Diagnosis not present

## 2021-02-22 DIAGNOSIS — S62211A Bennett's fracture, right hand, initial encounter for closed fracture: Secondary | ICD-10-CM | POA: Diagnosis not present

## 2021-02-22 DIAGNOSIS — E785 Hyperlipidemia, unspecified: Secondary | ICD-10-CM | POA: Diagnosis not present

## 2021-02-28 DIAGNOSIS — Z23 Encounter for immunization: Secondary | ICD-10-CM | POA: Diagnosis not present

## 2021-03-14 ENCOUNTER — Other Ambulatory Visit: Payer: Self-pay | Admitting: Internal Medicine

## 2021-03-14 DIAGNOSIS — K869 Disease of pancreas, unspecified: Secondary | ICD-10-CM

## 2021-03-26 ENCOUNTER — Other Ambulatory Visit: Payer: Self-pay

## 2021-03-26 ENCOUNTER — Ambulatory Visit
Admission: RE | Admit: 2021-03-26 | Discharge: 2021-03-26 | Disposition: A | Discharge status: Home / Self Care | Payer: Medicare Other | Source: Ambulatory Visit | Attending: Internal Medicine | Admitting: Internal Medicine

## 2021-03-26 DIAGNOSIS — K869 Disease of pancreas, unspecified: Secondary | ICD-10-CM

## 2021-03-26 MED ORDER — GADOBENATE DIMEGLUMINE 529 MG/ML IV SOLN
20.0000 mL | Freq: Once | INTRAVENOUS | Status: AC | PRN
  Administered 2021-03-26: 20 mL via INTRAVENOUS

## 2021-05-16 DIAGNOSIS — Z23 Encounter for immunization: Secondary | ICD-10-CM | POA: Diagnosis not present

## 2021-05-16 DIAGNOSIS — K760 Fatty (change of) liver, not elsewhere classified: Secondary | ICD-10-CM | POA: Diagnosis not present

## 2021-05-16 DIAGNOSIS — Z79899 Other long term (current) drug therapy: Secondary | ICD-10-CM | POA: Diagnosis not present

## 2021-05-16 DIAGNOSIS — E1129 Type 2 diabetes mellitus with other diabetic kidney complication: Secondary | ICD-10-CM | POA: Diagnosis not present

## 2021-05-16 DIAGNOSIS — E039 Hypothyroidism, unspecified: Secondary | ICD-10-CM | POA: Diagnosis not present

## 2021-05-16 DIAGNOSIS — E1140 Type 2 diabetes mellitus with diabetic neuropathy, unspecified: Secondary | ICD-10-CM | POA: Diagnosis not present

## 2021-05-16 DIAGNOSIS — E785 Hyperlipidemia, unspecified: Secondary | ICD-10-CM | POA: Diagnosis not present

## 2021-05-25 DIAGNOSIS — R7309 Other abnormal glucose: Secondary | ICD-10-CM | POA: Diagnosis not present

## 2021-05-25 DIAGNOSIS — E1122 Type 2 diabetes mellitus with diabetic chronic kidney disease: Secondary | ICD-10-CM | POA: Diagnosis not present

## 2021-05-25 DIAGNOSIS — E039 Hypothyroidism, unspecified: Secondary | ICD-10-CM | POA: Diagnosis not present

## 2021-05-30 DIAGNOSIS — Z23 Encounter for immunization: Secondary | ICD-10-CM | POA: Diagnosis not present

## 2021-08-08 DIAGNOSIS — E119 Type 2 diabetes mellitus without complications: Secondary | ICD-10-CM | POA: Diagnosis not present

## 2021-08-16 DIAGNOSIS — E1129 Type 2 diabetes mellitus with other diabetic kidney complication: Secondary | ICD-10-CM | POA: Diagnosis not present

## 2021-08-29 DIAGNOSIS — R7309 Other abnormal glucose: Secondary | ICD-10-CM | POA: Diagnosis not present

## 2021-08-29 DIAGNOSIS — E1122 Type 2 diabetes mellitus with diabetic chronic kidney disease: Secondary | ICD-10-CM | POA: Diagnosis not present

## 2021-08-29 DIAGNOSIS — I10 Essential (primary) hypertension: Secondary | ICD-10-CM | POA: Diagnosis not present

## 2021-08-29 DIAGNOSIS — U099 Post covid-19 condition, unspecified: Secondary | ICD-10-CM | POA: Diagnosis not present

## 2021-10-18 DIAGNOSIS — E785 Hyperlipidemia, unspecified: Secondary | ICD-10-CM | POA: Diagnosis not present

## 2021-10-18 DIAGNOSIS — I10 Essential (primary) hypertension: Secondary | ICD-10-CM | POA: Diagnosis not present

## 2021-11-16 DIAGNOSIS — E1129 Type 2 diabetes mellitus with other diabetic kidney complication: Secondary | ICD-10-CM | POA: Diagnosis not present

## 2021-11-18 DIAGNOSIS — I10 Essential (primary) hypertension: Secondary | ICD-10-CM | POA: Diagnosis not present

## 2021-11-18 DIAGNOSIS — E785 Hyperlipidemia, unspecified: Secondary | ICD-10-CM | POA: Diagnosis not present

## 2021-11-30 DIAGNOSIS — I10 Essential (primary) hypertension: Secondary | ICD-10-CM | POA: Diagnosis not present

## 2021-11-30 DIAGNOSIS — E1122 Type 2 diabetes mellitus with diabetic chronic kidney disease: Secondary | ICD-10-CM | POA: Diagnosis not present

## 2021-11-30 DIAGNOSIS — R7309 Other abnormal glucose: Secondary | ICD-10-CM | POA: Diagnosis not present

## 2021-12-12 ENCOUNTER — Other Ambulatory Visit: Payer: HMO | Admitting: Obstetrics & Gynecology

## 2021-12-13 ENCOUNTER — Other Ambulatory Visit (HOSPITAL_COMMUNITY): Payer: Self-pay | Admitting: Obstetrics & Gynecology

## 2021-12-13 DIAGNOSIS — Z1231 Encounter for screening mammogram for malignant neoplasm of breast: Secondary | ICD-10-CM

## 2021-12-22 ENCOUNTER — Encounter: Payer: Self-pay | Admitting: Obstetrics & Gynecology

## 2021-12-22 ENCOUNTER — Ambulatory Visit (INDEPENDENT_AMBULATORY_CARE_PROVIDER_SITE_OTHER): Payer: HMO | Admitting: Obstetrics & Gynecology

## 2021-12-22 ENCOUNTER — Other Ambulatory Visit (HOSPITAL_COMMUNITY)
Admission: RE | Admit: 2021-12-22 | Discharge: 2021-12-22 | Disposition: A | Discharge status: Home / Self Care | Payer: HMO | Source: Ambulatory Visit | Attending: Obstetrics & Gynecology | Admitting: Obstetrics & Gynecology

## 2021-12-22 VITALS — BP 121/62 | HR 69 | Ht 67.0 in | Wt 224.0 lb

## 2021-12-22 DIAGNOSIS — Z01419 Encounter for gynecological examination (general) (routine) without abnormal findings: Secondary | ICD-10-CM | POA: Diagnosis not present

## 2021-12-22 DIAGNOSIS — Z1211 Encounter for screening for malignant neoplasm of colon: Secondary | ICD-10-CM | POA: Diagnosis not present

## 2021-12-22 DIAGNOSIS — Z1212 Encounter for screening for malignant neoplasm of rectum: Secondary | ICD-10-CM | POA: Diagnosis not present

## 2021-12-22 DIAGNOSIS — Z1151 Encounter for screening for human papillomavirus (HPV): Secondary | ICD-10-CM | POA: Diagnosis not present

## 2021-12-22 NOTE — Progress Notes (Signed)
Subjective:     Nancy [name] is a 56 y.o. female here for a routine exam.  No LMP recorded. Patient is postmenopausal. G0P0000 Birth Control Method:  menopause Menstrual Calendar(currently): amenorrhea  Current complaints: none.   Current acute medical issues:     Recent Gynecologic History No LMP recorded. Patient is postmenopausal. Last Pap: 2021,  normal Last mammogram: 6/22,  normal  Past Medical History:  Diagnosis Date   Back pain    Complication of anesthesia    Diabetes mellitus    Hypothyroidism    PONV (postoperative nausea and vomiting)    Rosacea    Small bowel obstruction ([place])    Thyroid disease    Ventral hernia     Past Surgical History:  Procedure Laterality Date   BACK SURGERY     x3; fusion with rods and screws   carpatunnel surgery Bilateral    CARPOMETACARPEL SUSPENSION PLASTY Left 05/23/2018   Procedure: CARPOMETACARPEL ([place]) SUSPENSION PLASTY LEFT THUMB;  Surgeon: Daryll Brod, MD;  Location: [place];  Service: Orthopedics;  Laterality: Left;   CARPOMETACARPEL SUSPENSION PLASTY Right 10/03/2018   Procedure: RIGHT THUMB SUSPENSONPLASTY WITH MICRO LINK SUTURE AND TRAPEXIUM EXCISION;  Surgeon: Daryll Brod, MD;  Location: [place];  Service: Orthopedics;  Laterality: Right;  AXILLARY BLOCK   CHOLECYSTECTOMY     COLON SURGERY     COLOSTOMY     ENDOMETRIAL ABLATION     INSERTION OF MESH  08/03/2012   Procedure: INSERTION OF MESH;  Surgeon: Gwenyth Ober, MD;  Location: [place];  Service: General;  Laterality: N/A;   LAPAROSCOPIC BILATERAL SALPINGO OOPHERECTOMY Bilateral 05/12/2015   Procedure: LAPAROSCOPIC BILATERAL SALPINGO OOPHORECTOMY;  Surgeon: Florian Buff, MD;  Location: [facility];  Service: Gynecology;  Laterality: Bilateral;   LAPAROSCOPIC LYSIS OF ADHESIONS Bilateral 05/12/2015   Procedure: EXTENSIVE LAPAROSCOPIC LYSIS OF ADHESIONS;  Surgeon: Florian Buff, MD;  Location: [facility];  Service: Gynecology;  Laterality:  Bilateral;   LAPAROTOMY  08/03/2012   Procedure: EXPLORATORY LAPAROTOMY;  Surgeon: Gwenyth Ober, MD;  Location: [place];  Service: General;  Laterality: N/A;  with extensive enterolysis   LIVER BIOPSY     REVISION COLOSTOMY     TENDON TRANSFER Left 05/23/2018   Procedure: TENDON TRANSFER WITH TRAPEZIUM EXCISION;  Surgeon: Daryll Brod, MD;  Location: [place];  Service: Orthopedics;  Laterality: Left;   TRIGGER FINGER RELEASE Left 05/23/2018   Procedure: RELEASE TRIGGER FINGER/A-1 PULLEY LEFT THUMB;  Surgeon: Daryll Brod, MD;  Location: [place];  Service: Orthopedics;  Laterality: Left;   TRIGGER FINGER RELEASE Right 09/23/2019   Procedure: RIGHT THUMB RELEASE TRIGGER FINGER/A-1 PULLEY;  Surgeon: Daryll Brod, MD;  Location: [place];  Service: Orthopedics;  Laterality: Right;  IV REGIONAL FOREARM BLOCK   VENTRAL HERNIA REPAIR  08/03/2012   Procedure: HERNIA REPAIR VENTRAL ADULT;  Surgeon: Gwenyth Ober, MD;  Location: [place];  Service: General;  Laterality: N/A;   VENTRAL HERNIA REPAIR  03/13/2016   Procedure: INCISIONAL HERNIA REPAIR WITH MESH;  Surgeon: Aviva Signs, MD;  Location: [facility];  Service: General;;    OB History     Gravida  0   Para  0   Term  0   Preterm  0   AB  0   Living  0      SAB  0   IAB  0   Ectopic  0   Multiple  0   Live Births  0           Social History   Socioeconomic History   Marital status: Married    Spouse name: Not on file   Number of children: Not on file   Years of education: Not on file   Highest education level: Not on file  Occupational History   Not on file  Tobacco Use   Smoking status: Never   Smokeless tobacco: Never  Vaping Use   Vaping Use: Never used  Substance and Sexual Activity   Alcohol use: No    Alcohol/week: 0.0 standard drinks   Drug use: No   Sexual activity: Yes    Birth control/protection: Surgical  Other Topics Concern   Not on file  Social  History Narrative   Not on file   Social Determinants of Health   Financial Resource Strain: Low Risk    Difficulty of Paying Living Expenses: Not very hard  Food Insecurity: No Food Insecurity   Worried About Running Out of Food in the Last Year: Never true   Ran Out of Food in the Last Year: Never true  Transportation Needs: No Transportation Needs   Lack of Transportation (Medical): No   Lack of Transportation (Non-Medical): No  Physical Activity: Sufficiently Active   Days of Exercise per Week: 5 days   Minutes of Exercise per Session: 30 min  Stress: No Stress Concern Present   Feeling of Stress : Not at all  Social Connections: Unknown   Frequency of Communication with Friends and Family: More than three times a week   Frequency of Social Gatherings with Friends and Family: More than three times a week   Attends Religious Services: More than 4 times per year   Active Member of [employer] or Organizations: No   Attends Music therapist: Never   Marital Status: Not on file    Family History  Problem Relation Age of Onset   Thyroid disease Mother    Heart disease Father    Diabetes Mellitus I Maternal Grandmother    Diabetes Mellitus I Maternal Grandfather    Diabetes Mellitus I Paternal Grandmother    Diabetes Mellitus I Paternal Grandfather      Current Outpatient Medications:    aspirin 81 MG EC tablet, Take 81 mg by mouth every evening. , Disp: , Rfl:    atorvastatin (LIPITOR) 10 MG tablet, Take 10 mg by mouth every other day. , Disp: , Rfl:    Cholecalciferol 5000 UNITS capsule, Take 5,000 Units by mouth every morning. Vitamin D, Disp: , Rfl:    cyclobenzaprine (FLEXERIL) 10 MG tablet, Take 10 mg by mouth 3 (three) times daily as needed for muscle spasms., Disp: , Rfl:    dapagliflozin propanediol (FARXIGA) 10 MG TABS tablet, Take by mouth daily., Disp: , Rfl:    doxycycline (VIBRA-TABS) 100 MG tablet, Take 100 mg by mouth as needed. , Disp: , Rfl:     enalapril (VASOTEC) 5 MG tablet, Take 5 mg by mouth at bedtime.  , Disp: , Rfl:    insulin NPH Human (NOVOLIN N) 100 UNIT/ML injection, Inject into the skin. TAKE 55U QAM, 50U QPM, Disp: , Rfl:    levothyroxine (SYNTHROID, LEVOTHROID) 150 MCG tablet, Take 137 mcg by mouth daily before breakfast. , Disp: , Rfl:    metFORMIN (GLUCOPHAGE) 1000 MG tablet, Take 1 tablet (1,000 mg total) by mouth 2 (two) times daily., Disp: , Rfl:    methocarbamol (ROBAXIN)  500 MG tablet, Take 500 mg by mouth every 6 (six) hours as needed for muscle spasms., Disp: , Rfl:    MetroNIDAZOLE-Cleanser 0.75 % (Cream) KIT, Apply 1 application topically as needed. To face for rosacea , Disp: , Rfl:    Multiple Vitamin (MULTIVITAMIN) tablet, Take 1 tablet by mouth daily., Disp: , Rfl:    Probiotic Product (PROBIOTIC COLON SUPPORT) CAPS, Take by mouth., Disp: , Rfl:    Semaglutide, 1 MG/DOSE, (OZEMPIC, 1 MG/DOSE,) 2 MG/1.5ML SOPN, Inject into the skin., Disp: , Rfl:   Review of Systems  Review of Systems  Constitutional: Negative for fever, chills, weight loss, malaise/fatigue and diaphoresis.  HENT: Negative for hearing loss, ear pain, nosebleeds, congestion, sore throat, neck pain, tinnitus and ear discharge.   Eyes: Negative for blurred vision, double vision, photophobia, pain, discharge and redness.  Respiratory: Negative for cough, hemoptysis, sputum production, shortness of breath, wheezing and stridor.   Cardiovascular: Negative for chest pain, palpitations, orthopnea, claudication, leg swelling and PND.  Gastrointestinal: negative for abdominal pain. Negative for heartburn, nausea, vomiting, diarrhea, constipation, blood in stool and melena.  Genitourinary: Negative for dysuria, urgency, frequency, hematuria and flank pain.  Musculoskeletal: Negative for myalgias, back pain, joint pain and falls.  Skin: Negative for itching and rash.  Neurological: Negative for dizziness, tingling, tremors, sensory change, speech  change, focal weakness, seizures, loss of consciousness, weakness and headaches.  Endo/Heme/Allergies: Negative for environmental allergies and polydipsia. Does not bruise/bleed easily.  Psychiatric/Behavioral: Negative for depression, suicidal ideas, hallucinations, memory loss and substance abuse. The patient is not nervous/anxious and does not have insomnia.        Objective:  Blood pressure 121/62, pulse 69, height 5' 7" (1.702 m), weight 224 lb (101.6 kg).   Physical Exam  Vitals reviewed. Constitutional: She is oriented to person, place, and time. She appears well-developed and well-nourished.  HENT:  Head: Normocephalic and atraumatic.        Right Ear: External ear normal.  Left Ear: External ear normal.  Nose: Nose normal.  Mouth/Throat: Oropharynx is clear and moist.  Eyes: Conjunctivae and EOM are normal. Pupils are equal, round, and reactive to light. Right eye exhibits no discharge. Left eye exhibits no discharge. No scleral icterus.  Neck: Normal range of motion. Neck supple. No tracheal deviation present. No thyromegaly present.  Cardiovascular: Normal rate, regular rhythm, normal heart sounds and intact distal pulses.  Exam reveals no gallop and no friction rub.   No murmur heard. Respiratory: Effort normal and breath sounds normal. No respiratory distress. She has no wheezes. She has no rales. She exhibits no tenderness.  GI: Soft. Bowel sounds are normal. She exhibits no distension and no mass. There is no tenderness. There is no rebound and no guarding.  Genitourinary:  Breasts no masses skin changes or nipple changes bilaterally      Vulva is normal without lesions Vagina is pink moist without discharge Cervix normal in appearance and pap is done Uterus is normal size shape and contour Adnexa is negative with normal sized ovaries  {Rectal    hemoccult negative, normal tone, no masses  Musculoskeletal: Normal range of motion. She exhibits no edema and no tenderness.   Neurological: She is alert and oriented to person, place, and time. She has normal reflexes. She displays normal reflexes. No cranial nerve deficit. She exhibits normal muscle tone. Coordination normal.  Skin: Skin is warm and dry. No rash noted. No erythema. No pallor.  Psychiatric: She has a normal mood  and affect. Her behavior is normal. Judgment and thought content normal.       Medications Ordered at today's visit: No orders of the defined types were placed in this encounter.   Other orders placed at today's visit: No orders of the defined types were placed in this encounter.     Assessment:    Normal Gyn exam.    Plan:    Follow up in: 3 years.     No follow-ups on file.

## 2021-12-26 LAB — CYTOLOGY - PAP
Comment: NEGATIVE
Diagnosis: NEGATIVE
High risk HPV: NEGATIVE

## 2022-01-18 DIAGNOSIS — E785 Hyperlipidemia, unspecified: Secondary | ICD-10-CM | POA: Diagnosis not present

## 2022-01-18 DIAGNOSIS — I10 Essential (primary) hypertension: Secondary | ICD-10-CM | POA: Diagnosis not present

## 2022-01-30 ENCOUNTER — Ambulatory Visit (HOSPITAL_COMMUNITY)
Admission: RE | Admit: 2022-01-30 | Discharge: 2022-01-30 | Disposition: A | Discharge status: Home / Self Care | Payer: HMO | Source: Ambulatory Visit | Attending: Obstetrics & Gynecology | Admitting: Obstetrics & Gynecology

## 2022-01-30 DIAGNOSIS — Z1231 Encounter for screening mammogram for malignant neoplasm of breast: Secondary | ICD-10-CM | POA: Insufficient documentation

## 2022-02-23 DIAGNOSIS — I10 Essential (primary) hypertension: Secondary | ICD-10-CM | POA: Diagnosis not present

## 2022-02-23 DIAGNOSIS — E1129 Type 2 diabetes mellitus with other diabetic kidney complication: Secondary | ICD-10-CM | POA: Diagnosis not present

## 2022-02-23 DIAGNOSIS — E1139 Type 2 diabetes mellitus with other diabetic ophthalmic complication: Secondary | ICD-10-CM | POA: Diagnosis not present

## 2022-02-23 DIAGNOSIS — E785 Hyperlipidemia, unspecified: Secondary | ICD-10-CM | POA: Diagnosis not present

## 2022-02-23 DIAGNOSIS — E1140 Type 2 diabetes mellitus with diabetic neuropathy, unspecified: Secondary | ICD-10-CM | POA: Diagnosis not present

## 2022-02-23 DIAGNOSIS — Z79899 Other long term (current) drug therapy: Secondary | ICD-10-CM | POA: Diagnosis not present

## 2022-03-03 DIAGNOSIS — E1122 Type 2 diabetes mellitus with diabetic chronic kidney disease: Secondary | ICD-10-CM | POA: Diagnosis not present

## 2022-03-03 DIAGNOSIS — E039 Hypothyroidism, unspecified: Secondary | ICD-10-CM | POA: Diagnosis not present

## 2022-03-03 DIAGNOSIS — R7309 Other abnormal glucose: Secondary | ICD-10-CM | POA: Diagnosis not present

## 2022-03-03 DIAGNOSIS — E785 Hyperlipidemia, unspecified: Secondary | ICD-10-CM | POA: Diagnosis not present

## 2022-03-08 ENCOUNTER — Other Ambulatory Visit: Payer: Self-pay | Admitting: Internal Medicine

## 2022-03-08 DIAGNOSIS — K869 Disease of pancreas, unspecified: Secondary | ICD-10-CM

## 2022-03-22 ENCOUNTER — Ambulatory Visit
Admission: RE | Admit: 2022-03-22 | Discharge: 2022-03-22 | Disposition: A | Discharge status: Home / Self Care | Payer: PPO | Source: Ambulatory Visit | Attending: Internal Medicine | Admitting: Internal Medicine

## 2022-03-22 DIAGNOSIS — K863 Pseudocyst of pancreas: Secondary | ICD-10-CM | POA: Diagnosis not present

## 2022-03-22 DIAGNOSIS — N281 Cyst of kidney, acquired: Secondary | ICD-10-CM | POA: Diagnosis not present

## 2022-03-22 DIAGNOSIS — D3502 Benign neoplasm of left adrenal gland: Secondary | ICD-10-CM | POA: Diagnosis not present

## 2022-03-22 DIAGNOSIS — K869 Disease of pancreas, unspecified: Secondary | ICD-10-CM

## 2022-03-22 DIAGNOSIS — K760 Fatty (change of) liver, not elsewhere classified: Secondary | ICD-10-CM | POA: Diagnosis not present

## 2022-03-22 MED ORDER — GADOBENATE DIMEGLUMINE 529 MG/ML IV SOLN
20.0000 mL | Freq: Once | INTRAVENOUS | Status: AC | PRN
  Administered 2022-03-22: 20 mL via INTRAVENOUS

## 2022-03-27 ENCOUNTER — Other Ambulatory Visit: Payer: Self-pay | Admitting: *Deleted

## 2022-03-27 DIAGNOSIS — L723 Sebaceous cyst: Secondary | ICD-10-CM

## 2022-03-30 ENCOUNTER — Ambulatory Visit (INDEPENDENT_AMBULATORY_CARE_PROVIDER_SITE_OTHER): Payer: HMO | Admitting: Surgery

## 2022-03-30 ENCOUNTER — Encounter: Payer: Self-pay | Admitting: Surgery

## 2022-03-30 VITALS — BP 142/86 | HR 77 | Temp 98.0°F | Resp 14 | Ht 66.0 in | Wt 215.0 lb

## 2022-03-30 DIAGNOSIS — L723 Sebaceous cyst: Secondary | ICD-10-CM

## 2022-03-31 NOTE — Progress Notes (Signed)
[facility] History and Physical  Reason for Referral: Left axillary cyst Referring Physician: Dr. Willey Blade  Chief Complaint   New Patient (Initial Visit)     Nancy [name] is a 56 y.o. female.  HPI: Patient presents for evaluation of a left axillary cyst.  She first noticed this area in 2016.  In 2020, it became inflamed, and in April of that year, she underwent an I&D of this area with Dr. Willey Blade and was placed on antibiotics.  Since that time, she has had no further episodes of inflammation or drainage.  She denies any fever, chills, nausea, and vomiting.  She does have associated pain with this area, and wants to have it taken care of prior to it ever getting really inflamed again.  She also has a bump on her right forearm, but this area does not cause her pain.  She was advised by her primary care doctor, to not undergo any kind of intervention for this.  Her past medical history significant for diabetes, hypertension, and hypothyroidism.  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  She denies ever having any cyst removed.  She takes an 81 mg aspirin daily.  She denies use of tobacco products, alcohol, and illicit drugs.  Past Medical History:  Diagnosis Date   Back pain    Complication of anesthesia    Diabetes mellitus    Hypothyroidism    PONV (postoperative nausea and vomiting)    Rosacea    Small bowel obstruction ([place])    Thyroid disease    Ventral hernia     Past Surgical History:  Procedure Laterality Date   BACK SURGERY     x3; fusion with rods and screws   carpatunnel surgery Bilateral    CARPOMETACARPEL SUSPENSION PLASTY Left 05/23/2018   Procedure: CARPOMETACARPEL ([place]) SUSPENSION PLASTY LEFT THUMB;  Surgeon: Daryll Brod, MD;  Location: [place];  Service: Orthopedics;  Laterality: Left;    CARPOMETACARPEL SUSPENSION PLASTY Right 10/03/2018   Procedure: RIGHT THUMB SUSPENSONPLASTY WITH MICRO LINK SUTURE AND TRAPEXIUM EXCISION;  Surgeon: Daryll Brod, MD;  Location: [place];  Service: Orthopedics;  Laterality: Right;  AXILLARY BLOCK   CHOLECYSTECTOMY     COLON SURGERY     COLOSTOMY     ENDOMETRIAL ABLATION     INSERTION OF MESH  08/03/2012   Procedure: INSERTION OF MESH;  Surgeon: Gwenyth Ober, MD;  Location: [place];  Service: General;  Laterality: N/A;   LAPAROSCOPIC BILATERAL SALPINGO OOPHERECTOMY Bilateral 05/12/2015   Procedure: LAPAROSCOPIC BILATERAL SALPINGO OOPHORECTOMY;  Surgeon: Florian Buff, MD;  Location: [facility];  Service: Gynecology;  Laterality: Bilateral;   LAPAROSCOPIC LYSIS OF ADHESIONS Bilateral 05/12/2015   Procedure: EXTENSIVE LAPAROSCOPIC LYSIS OF ADHESIONS;  Surgeon: Florian Buff, MD;  Location: [facility];  Service: Gynecology;  Laterality: Bilateral;   LAPAROTOMY  08/03/2012   Procedure: EXPLORATORY LAPAROTOMY;  Surgeon: Gwenyth Ober, MD;  Location: [place];  Service: General;  Laterality: N/A;  with extensive enterolysis   LIVER BIOPSY     REVISION COLOSTOMY     TENDON TRANSFER Left 05/23/2018   Procedure: TENDON TRANSFER WITH TRAPEZIUM EXCISION;  Surgeon: Daryll Brod, MD;  Location: [place];  Service: Orthopedics;  Laterality: Left;   TRIGGER FINGER RELEASE Left 05/23/2018   Procedure: RELEASE TRIGGER FINGER/A-1 PULLEY LEFT THUMB;  Surgeon: Daryll Brod,  MD;  Location: [place];  Service: Orthopedics;  Laterality: Left;   TRIGGER FINGER RELEASE Right 09/23/2019   Procedure: RIGHT THUMB RELEASE TRIGGER FINGER/A-1 PULLEY;  Surgeon: Daryll Brod, MD;  Location: [place];  Service: Orthopedics;  Laterality: Right;  IV REGIONAL FOREARM BLOCK   VENTRAL HERNIA REPAIR  08/03/2012   Procedure: HERNIA REPAIR VENTRAL ADULT;  Surgeon: Gwenyth Ober, MD;  Location: [place];  Service: General;  Laterality: N/A;    VENTRAL HERNIA REPAIR  03/13/2016   Procedure: INCISIONAL HERNIA REPAIR WITH MESH;  Surgeon: Aviva Signs, MD;  Location[facility];  Service: General;;    Family History  Problem Relation Age of Onset   Thyroid disease Mother    Heart disease Father    Diabetes Mellitus I Maternal Grandmother    Diabetes Mellitus I Maternal Grandfather    Diabetes Mellitus I Paternal Grandmother    Diabetes Mellitus I Paternal Grandfather     Social History   Tobacco Use   Smoking status: Never   Smokeless tobacco: Never  Vaping Use   Vaping Use: Never used  Substance Use Topics   Alcohol use: No    Alcohol/week: 0.0 standard drinks of alcohol   Drug use: No    Medications: I have reviewed the patient's current medications. Allergies as of 03/30/2022       Reactions   Chocolate Other (See Comments)   rosacea        Medication List        Accurate as of March 30, 2022 11:59 PM. If you have any questions, ask your nurse or doctor.          STOP taking these medications    methocarbamol 500 MG tablet Commonly known as: ROBAXIN Stopped by: Edna Grover A Audri Kozub, DO       TAKE these medications    aspirin EC 81 MG tablet Take 81 mg by mouth every evening.   atorvastatin 10 MG tablet Commonly known as: LIPITOR Take 10 mg by mouth every other day.   Cholecalciferol 125 MCG (5000 UT) capsule Take 5,000 Units by mouth every morning. Vitamin D   cyclobenzaprine 10 MG tablet Commonly known as: FLEXERIL Take 10 mg by mouth 3 (three) times daily as needed for muscle spasms.   doxycycline 100 MG tablet Commonly known as: VIBRA-TABS Take 100 mg by mouth as needed.   enalapril 5 MG tablet Commonly known as: VASOTEC Take 5 mg by mouth at bedtime.   Farxiga 10 MG Tabs tablet Generic drug: dapagliflozin propanediol Take by mouth daily.   insulin NPH Human 100 UNIT/ML injection Commonly known as: NOVOLIN N Inject into the skin. TAKE 60U QAM, 55U QPM   levothyroxine  137 MCG tablet Commonly known as: SYNTHROID Take 137 mcg by mouth daily. What changed: Another medication with the same name was removed. Continue taking this medication, and follow the directions you see here. Changed by: Renie Stelmach A Sharnell Knight, DO   metFORMIN 1000 MG tablet Commonly known as: GLUCOPHAGE Take 1 tablet (1,000 mg total) by mouth 2 (two) times daily.   metroNIDAZOLE-Cleanser 0.75 % CREAM Kit Apply 1 application topically as needed. To face for rosacea   multivitamin tablet Take 1 tablet by mouth daily.   Ozempic (1 MG/DOSE) 2 MG/1.5ML Sopn Generic drug: Semaglutide (1 MG/DOSE) Inject into the skin.   Probiotic Colon Support Caps Take by mouth.         ROS:  Constitutional: negative for chills, fatigue, and fevers  Eyes: negative for visual disturbance and pain Ears, nose, mouth, throat, and face: negative for ear drainage, sore throat, and sinus problems Respiratory: negative for cough, wheezing, and shortness of breath Cardiovascular: negative for chest pain Gastrointestinal: negative for abdominal pain, nausea, reflux symptoms, and vomiting Genitourinary:negative for dysuria, frequency, and urinary retention Integument/breast: negative for dryness and rash Hematologic/lymphatic: negative for bleeding and lymphadenopathy Musculoskeletal:positive for back pain, negative for neck pain and joint pain Neurological: negative for dizziness, tremors, and numbness Endocrine: negative for temperature intolerance  Blood pressure (!) 142/86, pulse 77, temperature 98 F (36.7 C), temperature source Oral, resp. rate 14, height _0  (1.676 m), weight 215 lb (97.5 kg), SpO2 95 %. Physical Exam Vitals reviewed.  Constitutional:      Appearance: Normal appearance.  HENT:     Head: Normocephalic and atraumatic.  Eyes:     Extraocular Movements: Extraocular movements intact.     Pupils: Pupils are equal, round, and reactive to light.  Cardiovascular:     Rate and  Rhythm: Normal rate and regular rhythm.  Pulmonary:     Effort: Pulmonary effort is normal.     Breath sounds: Normal breath sounds.  Abdominal:     General: There is no distension.     Palpations: Abdomen is soft.     Tenderness: There is no abdominal tenderness.  Musculoskeletal:        General: Normal range of motion.     Cervical back: Normal range of motion.  Skin:    General: Skin is warm and dry.     Comments: Left axilla with a 1.5 cm cyst palpable, overlying cicatrix from previous I&D site; right forearm with soft and mobile structure, appears to be contiguous with the vein in her forearm  Neurological:     General: No focal deficit present.     Mental Status: She is alert and oriented to person, place, and time.  Psychiatric:        Mood and Affect: Mood normal.        Behavior: Behavior normal.     Results: No results found for this or any previous visit (from the past 48 hour(s)).  No results found.   Assessment & Plan:  CEANNA WAREING is a 56 y.o. female who presents for evaluation of left axillary cyst.  -I discussed the pathophysiology of sebaceous cyst with the patient, and explained why we recommend removal, especially if they are causing pain and had infections in the past -The risk and benefits of left axillary cyst excision were discussed including but not limited to bleeding, infection, injury to surrounding structures, and need for additional procedures.  After careful consideration, RAYLYN CARTON has decided to proceed with left axillary cyst excision.  Given the location, she would prefer for this to be handled in the operating room.  -Patient tentatively scheduled for surgery on 8/23 -Information provided to the patient regarding sebaceous cysts in her excision -Advised the patient to call the office or present to the ED if she begins to have fever, chills, worsening pain of the cyst, or associated drainage  All questions were answered to the  satisfaction of the patient.   Graciella Freer, DO [facility] [address] [place], [place] [address] [phone] (office)

## 2022-03-31 NOTE — H&P (Signed)
[facility] History and Physical   Reason for Referral: Left axillary cyst Referring Physician: Dr. Willey Blade   Chief Complaint   New Patient (Initial Visit)        Nancy [name] is a 56 y.o. female.  HPI: Patient presents for evaluation of a left axillary cyst.  She first noticed this area in 2016.  In 2020, it became inflamed, and in April of that year, she underwent an I&D of this area with Dr. Willey Blade and was placed on antibiotics.  Since that time, she has had no further episodes of inflammation or drainage.  She denies any fever, chills, nausea, and vomiting.  She does have associated pain with this area, and wants to have it taken care of prior to it ever getting really inflamed again.  She also has a bump on her right forearm, but this area does not cause her pain.  She was advised by her primary care doctor, to not undergo any kind of intervention for this.  Her past medical history significant for diabetes, hypertension, and hypothyroidism.  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  She denies ever having any cyst removed.  She takes an 81 mg aspirin daily.  She denies use of tobacco products, alcohol, and illicit drugs.       Past Medical History:  Diagnosis Date   Back pain     Complication of anesthesia     Diabetes mellitus     Hypothyroidism     PONV (postoperative nausea and vomiting)     Rosacea     Small bowel obstruction ([place])     Thyroid disease     Ventral hernia             Past Surgical History:  Procedure Laterality Date   BACK SURGERY        x3; fusion with rods and screws   carpatunnel surgery Bilateral     CARPOMETACARPEL SUSPENSION PLASTY Left 05/23/2018    Procedure: CARPOMETACARPEL ([place]) SUSPENSION PLASTY LEFT THUMB;  Surgeon: Daryll Brod, MD;  Location: [place];  Service:  Orthopedics;  Laterality: Left;   CARPOMETACARPEL SUSPENSION PLASTY Right 10/03/2018    Procedure: RIGHT THUMB SUSPENSONPLASTY WITH MICRO LINK SUTURE AND TRAPEXIUM EXCISION;  Surgeon: Daryll Brod, MD;  Location: [place];  Service: Orthopedics;  Laterality: Right;  AXILLARY BLOCK   CHOLECYSTECTOMY       COLON SURGERY       COLOSTOMY       ENDOMETRIAL ABLATION       INSERTION OF MESH   08/03/2012    Procedure: INSERTION OF MESH;  Surgeon: Gwenyth Ober, MD;  Location: [place];  Service: General;  Laterality: N/A;   LAPAROSCOPIC BILATERAL SALPINGO OOPHERECTOMY Bilateral 05/12/2015    Procedure: LAPAROSCOPIC BILATERAL SALPINGO OOPHORECTOMY;  Surgeon: Florian Buff, MD;  Location: [facility];  Service: Gynecology;  Laterality: Bilateral;   LAPAROSCOPIC LYSIS OF ADHESIONS Bilateral 05/12/2015    Procedure: EXTENSIVE LAPAROSCOPIC LYSIS OF ADHESIONS;  Surgeon: Florian Buff, MD;  Location: [facility];  Service: Gynecology;  Laterality: Bilateral;   LAPAROTOMY   08/03/2012    Procedure: EXPLORATORY LAPAROTOMY;  Surgeon: Gwenyth Ober, MD;  Location: [place];  Service: General;  Laterality: N/A;  with extensive enterolysis   LIVER BIOPSY       REVISION COLOSTOMY  TENDON TRANSFER Left 05/23/2018    Procedure: TENDON TRANSFER WITH TRAPEZIUM EXCISION;  Surgeon: Daryll Brod, MD;  Location: [place];  Service: Orthopedics;  Laterality: Left;   TRIGGER FINGER RELEASE Left 05/23/2018    Procedure: RELEASE TRIGGER FINGER/A-1 PULLEY LEFT THUMB;  Surgeon: Daryll Brod, MD;  Location: [place];  Service: Orthopedics;  Laterality: Left;   TRIGGER FINGER RELEASE Right 09/23/2019    Procedure: RIGHT THUMB RELEASE TRIGGER FINGER/A-1 PULLEY;  Surgeon: Daryll Brod, MD;  Location: [place];  Service: Orthopedics;  Laterality: Right;  IV REGIONAL FOREARM BLOCK   VENTRAL HERNIA REPAIR   08/03/2012    Procedure: HERNIA REPAIR VENTRAL ADULT;  Surgeon: Gwenyth Ober, MD;   Location: [place];  Service: General;  Laterality: N/A;   VENTRAL HERNIA REPAIR   03/13/2016    Procedure: INCISIONAL HERNIA REPAIR WITH MESH;  Surgeon: Aviva Signs, MD;  Location: [facility];  Service: General;;           Family History  Problem Relation Age of Onset   Thyroid disease Mother     Heart disease Father     Diabetes Mellitus I Maternal Grandmother     Diabetes Mellitus I Maternal Grandfather     Diabetes Mellitus I Paternal Grandmother     Diabetes Mellitus I Paternal Grandfather        Social History         Tobacco Use   Smoking status: Never   Smokeless tobacco: Never  Vaping Use   Vaping Use: Never used  Substance Use Topics   Alcohol use: No      Alcohol/week: 0.0 standard drinks of alcohol   Drug use: No      Medications: I have reviewed the patient's current medications. Allergies as of 03/30/2022         Reactions    Chocolate Other (See Comments)    rosacea            Medication List           Accurate as of March 30, 2022 11:59 PM. If you have any questions, ask your nurse or doctor.              STOP taking these medications     methocarbamol 500 MG tablet Commonly known as: ROBAXIN Stopped by: Trindon Dorton A Lucah Petta, DO           TAKE these medications     aspirin EC 81 MG tablet Take 81 mg by mouth every evening.    atorvastatin 10 MG tablet Commonly known as: LIPITOR Take 10 mg by mouth every other day.    Cholecalciferol 125 MCG (5000 UT) capsule Take 5,000 Units by mouth every morning. Vitamin D    cyclobenzaprine 10 MG tablet Commonly known as: FLEXERIL Take 10 mg by mouth 3 (three) times daily as needed for muscle spasms.    doxycycline 100 MG tablet Commonly known as: VIBRA-TABS Take 100 mg by mouth as needed.    enalapril 5 MG tablet Commonly known as: VASOTEC Take 5 mg by mouth at bedtime.    Farxiga 10 MG Tabs tablet Generic drug: dapagliflozin propanediol Take by mouth daily.    insulin NPH Human 100  UNIT/ML injection Commonly known as: NOVOLIN N Inject into the skin. TAKE 60U QAM, 55U QPM    levothyroxine 137 MCG tablet Commonly known as: SYNTHROID Take 137 mcg by mouth daily. What changed: Another medication with the same name was  removed. Continue taking this medication, and follow the directions you see here. Changed by: Kelie Gainey A Evelynne Spiers, DO    metFORMIN 1000 MG tablet Commonly known as: GLUCOPHAGE Take 1 tablet (1,000 mg total) by mouth 2 (two) times daily.    metroNIDAZOLE-Cleanser 0.75 % CREAM Kit Apply 1 application topically as needed. To face for rosacea    multivitamin tablet Take 1 tablet by mouth daily.    Ozempic (1 MG/DOSE) 2 MG/1.5ML Sopn Generic drug: Semaglutide (1 MG/DOSE) Inject into the skin.    Probiotic Colon Support Caps Take by mouth.               ROS:  Constitutional: negative for chills, fatigue, and fevers Eyes: negative for visual disturbance and pain Ears, nose, mouth, throat, and face: negative for ear drainage, sore throat, and sinus problems Respiratory: negative for cough, wheezing, and shortness of breath Cardiovascular: negative for chest pain Gastrointestinal: negative for abdominal pain, nausea, reflux symptoms, and vomiting Genitourinary:negative for dysuria, frequency, and urinary retention Integument/breast: negative for dryness and rash Hematologic/lymphatic: negative for bleeding and lymphadenopathy Musculoskeletal:positive for back pain, negative for neck pain and joint pain Neurological: negative for dizziness, tremors, and numbness Endocrine: negative for temperature intolerance   Blood pressure (!) 142/86, pulse 77, temperature 98 F (36.7 C), temperature source Oral, resp. rate 14, height _0  (1.676 m), weight 215 lb (97.5 kg), SpO2 95 %. Physical Exam Vitals reviewed.  Constitutional:      Appearance: Normal appearance.  HENT:     Head: Normocephalic and atraumatic.  Eyes:     Extraocular Movements:  Extraocular movements intact.     Pupils: Pupils are equal, round, and reactive to light.  Cardiovascular:     Rate and Rhythm: Normal rate and regular rhythm.  Pulmonary:     Effort: Pulmonary effort is normal.     Breath sounds: Normal breath sounds.  Abdominal:     General: There is no distension.     Palpations: Abdomen is soft.     Tenderness: There is no abdominal tenderness.  Musculoskeletal:        General: Normal range of motion.     Cervical back: Normal range of motion.  Skin:    General: Skin is warm and dry.     Comments: Left axilla with a 1.5 cm cyst palpable, overlying cicatrix from previous I&D site; right forearm with soft and mobile structure, appears to be contiguous with the vein in her forearm  Neurological:     General: No focal deficit present.     Mental Status: She is alert and oriented to person, place, and time.  Psychiatric:        Mood and Affect: Mood normal.        Behavior: Behavior normal.        Results: Lab Results Last 48 Hours  No results found for this or any previous visit (from the past 48 hour(s)).     Imaging Results (Last 48 hours)  No results found.       Assessment & Plan:  LANYIAH BRIX is a 56 y.o. female who presents for evaluation of left axillary cyst.   -I discussed the pathophysiology of sebaceous cyst with the patient, and explained why we recommend removal, especially if they are causing pain and had infections in the past -The risk and benefits of left axillary cyst excision were discussed including but not limited to bleeding, infection, injury to surrounding structures, and need for additional procedures.  After careful consideration, ELIANNAH HINDE has decided to proceed with left axillary cyst excision.  Given the location, she would prefer for this to be handled in the operating room.  -Patient tentatively scheduled for surgery on 8/23 -Information provided to the patient regarding sebaceous cysts in her  excision -Advised the patient to call the office or present to the ED if she begins to have fever, chills, worsening pain of the cyst, or associated drainage   All questions were answered to the satisfaction of the patient.     Graciella Freer, DO [facility] [address] [place], [place] [address] [phone] (office)                   Note Details  Author Rusty Aus, DO File Time 03/31/2022  1:55 PM  Author Type Physician Status Signed  Last Editor Rusty Aus, DO Service Surgery

## 2022-04-05 NOTE — Patient Instructions (Signed)
Nancy [name]  04/05/2022     '@PREFPERIOPPHARMACY'$ @   Your procedure is scheduled on  04/12/2022.   Report to [name] at  [place].   Call this number if you have problems the morning of surgery:  [phone]   Remember:  Do not eat or drink after midnight.         Your last dose of ozempic should have been on 8/16 or before.      Take 32.5 units of your night time insulin the night before your procedure.     DO NOT take any medications for diabetes the morning of your procedure.     Take these medicines the morning of surgery with A SIP OF WATER                                    flexeril, synthroid.     Do not wear jewelry, make-up or nail polish.  Do not wear lotions, powders, or perfumes, or deodorant.  Do not shave 48 hours prior to surgery.  Men may shave face and neck.  Do not bring valuables to the hospital.  [facility] is not responsible for any belongings or valuables.  Contacts, dentures or bridgework may not be worn into surgery.  Leave your suitcase in the car.  After surgery it may be brought to your room.  For patients admitted to the hospital, discharge time will be determined by your treatment team.  Patients discharged the day of surgery will not be allowed to drive home and must have someone with them for 24 hours.    Special instructions:   DO NOT smoke tobacco or vape for 24 hours before your procedure.  Please read over the following fact sheets that you were given. Pain Booklet, Coughing and Deep Breathing, Surgical Site Infection Prevention, Anesthesia Post-op Instructions, and Care and Recovery After Surgery      Epidermoid Cyst Removal, Care After This sheet gives you information about how to care for yourself after your procedure. Your health care provider may also give you more specific instructions. If you have problems or questions, contact your health care provider. What can I expect after the procedure? After the  procedure, it is common to have: Soreness in the area where your cyst was removed. Tightness or itchiness from the stitches (sutures) in your skin. Follow these instructions at home: Medicines Take over-the-counter and prescription medicines only as told by your health care provider. If you were prescribed an antibiotic medicine or ointment, take or apply it as told by your health care provider. Do not stop using the antibiotic even if you start to feel better. Incision care  Follow instructions from your health care provider about how to take care of your incision. Make sure you: Wash your hands with soap and water for at least 20 seconds before you change your bandage (dressing). If soap and water are not available, use hand sanitizer. Change your dressing as told by your health care provider. Leave sutures, skin glue, or adhesive strips in place. These skin closures may need to stay in place for 1-2 weeks or longer. If adhesive strip edges start to loosen and curl up, you may trim the loose edges. Do not remove adhesive strips completely unless your health care provider tells you to do that. Keep the dressing dry until your health care  provider says that it can be removed. After your dressing is off, check your incision area every day for signs of infection. Check for: Redness, swelling, or pain. Fluid or blood. Warmth. Pus or a bad smell. General instructions Do not take baths, swim, or use a hot tub until your health care provider approves. Ask your health care provider if you may take showers. You may only be allowed to take sponge baths. Your health care provider may ask you to avoid contact sports or activities that take a lot of effort. Do not do anything that stretches or puts pressure on your incision. You can return to your normal diet. Keep all follow-up visits. This is important. Contact a health care provider if: You have a fever. You have redness, swelling, or pain in the  incision area. You have fluid or blood coming from your incision. You have pus or a bad smell coming from your incision. Your incision feels warm to the touch. Your cyst grows back. Get help right away if: If the incision site suddenly increases in size and you have pain at the incision site. You may be checked for a collection of blood under the skin from the procedure (hematoma). Summary After the procedure, it is common to have soreness in the area where your cyst was removed. Take or apply over-the-counter and prescription medicines only as told by your health care provider. Follow instructions from your health care provider about how to take care of your incision. This information is not intended to replace advice given to you by your health care provider. Make sure you discuss any questions you have with your health care provider. Document Revised: 11/12/2019 Document Reviewed: 11/12/2019 Elsevier Patient Education  [place] Anesthesia, Adult, Care After The following information offers guidance on how to care for yourself after your procedure. Your health care provider may also give you more specific instructions. If you have problems or questions, contact your health care provider. What can I expect after the procedure? After the procedure, it is common for people to: Have pain or discomfort at the IV site. Have nausea or vomiting. Have a sore throat or hoarseness. Have trouble concentrating. Feel cold or chills. Feel weak, sleepy, or tired (fatigue). Have soreness and body aches. These can affect parts of the body that were not involved in surgery. Follow these instructions at home: For the time period you were told by your health care provider:  Rest. Do not participate in activities where you could fall or become injured. Do not drive or use machinery. Do not drink alcohol. Do not take sleeping pills or medicines that cause drowsiness. Do not make  important decisions or sign legal documents. Do not take care of children on your own. General instructions Drink enough fluid to keep your urine pale yellow. If you have sleep apnea, surgery and certain medicines can increase your risk for breathing problems. Follow instructions from your health care provider about wearing your sleep device: Anytime you are sleeping, including during daytime naps. While taking prescription pain medicines, sleeping medicines, or medicines that make you drowsy. Return to your normal activities as told by your health care provider. Ask your health care provider what activities are safe for you. Take over-the-counter and prescription medicines only as told by your health care provider. Do not use any products that contain nicotine or tobacco. These products include cigarettes, chewing tobacco, and vaping devices, such as e-cigarettes. These can delay incision healing after surgery. If  you need help quitting, ask your health care provider. Contact a health care provider if: You have nausea or vomiting that does not get better with medicine. You vomit every time you eat or drink. You have pain that does not get better with medicine. You cannot urinate or have bloody urine. You develop a skin rash. You have a fever. Get help right away if: You have trouble breathing. You have chest pain. You vomit blood. These symptoms may be an emergency. Get help right away. Call 911. Do not wait to see if the symptoms will go away. Do not drive yourself to the hospital. Summary After the procedure, it is common to have a sore throat, hoarseness, nausea, vomiting, or to feel weak, sleepy, or fatigue. For the time period you were told by your health care provider, do not drive or use machinery. Get help right away if you have difficulty breathing, have chest pain, or vomit blood. These symptoms may be an emergency. This information is not intended to replace advice given to  you by your health care provider. Make sure you discuss any questions you have with your health care provider. Document Revised: 11/04/2021 Document Reviewed: 11/04/2021 Elsevier Patient Education  [place]. How to Use Chlorhexidine Before Surgery Chlorhexidine gluconate (CHG) is a germ-killing (antiseptic) solution that is used to clean the skin. It can get rid of the bacteria that normally live on the skin and can keep them away for about 24 hours. To clean your skin with CHG, you may be given: A CHG solution to use in the shower or as part of a sponge bath. A prepackaged cloth that contains CHG. Cleaning your skin with CHG may help lower the risk for infection: While you are staying in the intensive care unit of the hospital. If you have a vascular access, such as a central line, to provide short-term or long-term access to your veins. If you have a catheter to drain urine from your bladder. If you are on a ventilator. A ventilator is a machine that helps you breathe by moving air in and out of your lungs. After surgery. What are the risks? Risks of using CHG include: A skin reaction. Hearing loss, if CHG gets in your ears and you have a perforated eardrum. Eye injury, if CHG gets in your eyes and is not rinsed out. The CHG product catching fire. Make sure that you avoid smoking and flames after applying CHG to your skin. Do not use CHG: If you have a chlorhexidine allergy or have previously reacted to chlorhexidine. On babies younger than 30 months of age. How to use CHG solution Use CHG only as told by your health care provider, and follow the instructions on the label. Use the full amount of CHG as directed. Usually, this is one bottle. During a shower Follow these steps when using CHG solution during a shower (unless your health care provider gives you different instructions): Start the shower. Use your normal soap and shampoo to wash your face and hair. Turn off the  shower or move out of the shower stream. Pour the CHG onto a clean washcloth. Do not use any type of brush or rough-edged sponge. Starting at your neck, lather your body down to your toes. Make sure you follow these instructions: If you will be having surgery, pay special attention to the part of your body where you will be having surgery. Scrub this area for at least 1 minute. Do not use CHG on  your head or face. If the solution gets into your ears or eyes, rinse them well with water. Avoid your genital area. Avoid any areas of skin that have broken skin, cuts, or scrapes. Scrub your back and under your arms. Make sure to wash skin folds. Let the lather sit on your skin for 1-2 minutes or as long as told by your health care provider. Thoroughly rinse your entire body in the shower. Make sure that all body creases and crevices are rinsed well. Dry off with a clean towel. Do not put any substances on your body afterward--such as powder, lotion, or perfume--unless you are told to do so by your health care provider. Only use lotions that are recommended by the manufacturer. Put on clean clothes or pajamas. If it is the night before your surgery, sleep in clean sheets.  During a sponge bath Follow these steps when using CHG solution during a sponge bath (unless your health care provider gives you different instructions): Use your normal soap and shampoo to wash your face and hair. Pour the CHG onto a clean washcloth. Starting at your neck, lather your body down to your toes. Make sure you follow these instructions: If you will be having surgery, pay special attention to the part of your body where you will be having surgery. Scrub this area for at least 1 minute. Do not use CHG on your head or face. If the solution gets into your ears or eyes, rinse them well with water. Avoid your genital area. Avoid any areas of skin that have broken skin, cuts, or scrapes. Scrub your back and under your arms.  Make sure to wash skin folds. Let the lather sit on your skin for 1-2 minutes or as long as told by your health care provider. Using a different clean, wet washcloth, thoroughly rinse your entire body. Make sure that all body creases and crevices are rinsed well. Dry off with a clean towel. Do not put any substances on your body afterward--such as powder, lotion, or perfume--unless you are told to do so by your health care provider. Only use lotions that are recommended by the manufacturer. Put on clean clothes or pajamas. If it is the night before your surgery, sleep in clean sheets. How to use CHG prepackaged cloths Only use CHG cloths as told by your health care provider, and follow the instructions on the label. Use the CHG cloth on clean, dry skin. Do not use the CHG cloth on your head or face unless your health care provider tells you to. When washing with the CHG cloth: Avoid your genital area. Avoid any areas of skin that have broken skin, cuts, or scrapes. Before surgery Follow these steps when using a CHG cloth to clean before surgery (unless your health care provider gives you different instructions): Using the CHG cloth, vigorously scrub the part of your body where you will be having surgery. Scrub using a back-and-forth motion for 3 minutes. The area on your body should be completely wet with CHG when you are done scrubbing. Do not rinse. Discard the cloth and let the area air-dry. Do not put any substances on the area afterward, such as powder, lotion, or perfume. Put on clean clothes or pajamas. If it is the night before your surgery, sleep in clean sheets.  For general bathing Follow these steps when using CHG cloths for general bathing (unless your health care provider gives you different instructions). Use a separate CHG cloth for each area of  your body. Make sure you wash between any folds of skin and between your fingers and toes. Wash your body in the following order,  switching to a new cloth after each step: The front of your neck, shoulders, and chest. Both of your arms, under your arms, and your hands. Your stomach and groin area, avoiding the genitals. Your right leg and foot. Your left leg and foot. The back of your neck, your back, and your buttocks. Do not rinse. Discard the cloth and let the area air-dry. Do not put any substances on your body afterward--such as powder, lotion, or perfume--unless you are told to do so by your health care provider. Only use lotions that are recommended by the manufacturer. Put on clean clothes or pajamas. Contact a health care provider if: Your skin gets irritated after scrubbing. You have questions about using your solution or cloth. You swallow any chlorhexidine. Call your local poison control center (1-989 181 9399 in the [place] Get help right away if: Your eyes itch badly, or they become very red or swollen. Your skin itches badly and is red or swollen. Your hearing changes. You have trouble seeing. You have swelling or tingling in your mouth or throat. You have trouble breathing. These symptoms may represent a serious problem that is an emergency. Do not wait to see if the symptoms will go away. Get medical help right away. Call your local emergency services (911 in the [place] Do not drive yourself to the hospital. Summary Chlorhexidine gluconate (CHG) is a germ-killing (antiseptic) solution that is used to clean the skin. Cleaning your skin with CHG may help to lower your risk for infection. You may be given CHG to use for bathing. It may be in a bottle or in a prepackaged cloth to use on your skin. Carefully follow your health care provider's instructions and the instructions on the product label. Do not use CHG if you have a chlorhexidine allergy. Contact your health care provider if your skin gets irritated after scrubbing. This information is not intended to replace advice given to you by your health  care provider. Make sure you discuss any questions you have with your health care provider. Document Revised: 12/05/2021 Document Reviewed: 10/18/2020 Elsevier Patient Education  [place].

## 2022-04-07 ENCOUNTER — Encounter (HOSPITAL_COMMUNITY)
Admission: RE | Admit: 2022-04-07 | Discharge: 2022-04-07 | Disposition: A | Discharge status: Home / Self Care | Payer: HMO | Source: Ambulatory Visit | Attending: Surgery | Admitting: Surgery

## 2022-04-07 ENCOUNTER — Encounter (HOSPITAL_COMMUNITY): Payer: Self-pay

## 2022-04-07 VITALS — BP 138/97 | HR 74 | Temp 98.0°F | Resp 16 | Ht 66.0 in | Wt 215.0 lb

## 2022-04-07 DIAGNOSIS — I10 Essential (primary) hypertension: Secondary | ICD-10-CM | POA: Diagnosis not present

## 2022-04-07 DIAGNOSIS — E119 Type 2 diabetes mellitus without complications: Secondary | ICD-10-CM

## 2022-04-07 DIAGNOSIS — Z01818 Encounter for other preprocedural examination: Secondary | ICD-10-CM | POA: Insufficient documentation

## 2022-04-07 HISTORY — DX: Unspecified osteoarthritis, unspecified site: M19.90

## 2022-04-07 LAB — BASIC METABOLIC PANEL
Anion gap: 10 (ref 5–15)
BUN: 11 mg/dL (ref 6–20)
CO2: 25 mmol/L (ref 22–32)
Calcium: 9.1 mg/dL (ref 8.9–10.3)
Chloride: 105 mmol/L (ref 98–111)
Creatinine, Ser: 0.54 mg/dL (ref 0.44–1.00)
GFR, Estimated: >60 mL/min (ref >60)
Glucose, Bld: 148 mg/dL — ABNORMAL HIGH (ref 70–99)
Potassium: 3.5 mmol/L (ref 3.5–5.1)
Sodium: 140 mmol/L (ref 135–145)

## 2022-04-07 LAB — HEMOGLOBIN A1C
Hgb A1c MFr Bld: 5.9 % — ABNORMAL HIGH (ref 4.8–5.6)
Mean Plasma Glucose: 122.63 mg/dL

## 2022-04-12 ENCOUNTER — Ambulatory Visit (HOSPITAL_BASED_OUTPATIENT_CLINIC_OR_DEPARTMENT_OTHER): Payer: HMO | Admitting: Certified Registered Nurse Anesthetist

## 2022-04-12 ENCOUNTER — Encounter (HOSPITAL_COMMUNITY): Payer: Self-pay | Admitting: Surgery

## 2022-04-12 ENCOUNTER — Encounter (HOSPITAL_COMMUNITY)
Admission: RE | Disposition: A | Discharge status: Home / Self Care | Payer: Self-pay | Source: Home / Self Care | Attending: Surgery

## 2022-04-12 ENCOUNTER — Ambulatory Visit (HOSPITAL_COMMUNITY): Payer: HMO | Admitting: Certified Registered Nurse Anesthetist

## 2022-04-12 ENCOUNTER — Other Ambulatory Visit: Payer: Self-pay

## 2022-04-12 ENCOUNTER — Ambulatory Visit (HOSPITAL_COMMUNITY)
Admission: RE | Admit: 2022-04-12 | Discharge: 2022-04-12 | Disposition: A | Discharge status: Home / Self Care | Payer: HMO | Attending: Surgery | Admitting: Surgery

## 2022-04-12 DIAGNOSIS — Z7982 Long term (current) use of aspirin: Secondary | ICD-10-CM | POA: Diagnosis not present

## 2022-04-12 DIAGNOSIS — E119 Type 2 diabetes mellitus without complications: Secondary | ICD-10-CM | POA: Diagnosis not present

## 2022-04-12 DIAGNOSIS — E039 Hypothyroidism, unspecified: Secondary | ICD-10-CM | POA: Diagnosis not present

## 2022-04-12 DIAGNOSIS — I10 Essential (primary) hypertension: Secondary | ICD-10-CM | POA: Insufficient documentation

## 2022-04-12 DIAGNOSIS — R2232 Localized swelling, mass and lump, left upper limb: Secondary | ICD-10-CM

## 2022-04-12 DIAGNOSIS — Z9049 Acquired absence of other specified parts of digestive tract: Secondary | ICD-10-CM | POA: Insufficient documentation

## 2022-04-12 DIAGNOSIS — R222 Localized swelling, mass and lump, trunk: Secondary | ICD-10-CM | POA: Insufficient documentation

## 2022-04-12 DIAGNOSIS — Z8719 Personal history of other diseases of the digestive system: Secondary | ICD-10-CM | POA: Insufficient documentation

## 2022-04-12 DIAGNOSIS — Z7984 Long term (current) use of oral hypoglycemic drugs: Secondary | ICD-10-CM | POA: Diagnosis not present

## 2022-04-12 DIAGNOSIS — Z794 Long term (current) use of insulin: Secondary | ICD-10-CM | POA: Insufficient documentation

## 2022-04-12 DIAGNOSIS — L720 Epidermal cyst: Secondary | ICD-10-CM | POA: Diagnosis not present

## 2022-04-12 HISTORY — PX: MASS EXCISION: SHX2000

## 2022-04-12 LAB — GLUCOSE, CAPILLARY
Glucose-Capillary: 106 mg/dL — ABNORMAL HIGH (ref 70–99)
Glucose-Capillary: 81 mg/dL (ref 70–99)

## 2022-04-12 SURGERY — EXCISION MASS
Anesthesia: General | Site: Axilla | Laterality: Left

## 2022-04-12 MED ORDER — SUGAMMADEX SODIUM 500 MG/5ML IV SOLN
INTRAVENOUS | Status: AC
  Filled 2022-04-12: qty 5

## 2022-04-12 MED ORDER — OXYCODONE HCL 5 MG/5ML PO SOLN: 5.0000 mg | Freq: Once | ORAL | Status: DC | PRN

## 2022-04-12 MED ORDER — PROPOFOL 500 MG/50ML IV EMUL
INTRAVENOUS | Status: DC | PRN
  Administered 2022-04-12: 50 ug/kg/min via INTRAVENOUS

## 2022-04-12 MED ORDER — CHLORHEXIDINE GLUCONATE CLOTH 2 % EX PADS: 6.0000 | MEDICATED_PAD | Freq: Once | CUTANEOUS | Status: DC

## 2022-04-12 MED ORDER — ONDANSETRON HCL 4 MG/2ML IJ SOLN: 4.0000 mg | Freq: Once | INTRAMUSCULAR | Status: DC | PRN

## 2022-04-12 MED ORDER — ONDANSETRON HCL 4 MG/2ML IJ SOLN
INTRAMUSCULAR | Status: DC | PRN
  Administered 2022-04-12: 4 mg via INTRAVENOUS

## 2022-04-12 MED ORDER — BUPIVACAINE HCL (PF) 0.5 % IJ SOLN
INTRAMUSCULAR | Status: DC | PRN
  Administered 2022-04-12: 10 mL

## 2022-04-12 MED ORDER — FENTANYL CITRATE (PF) 250 MCG/5ML IJ SOLN
INTRAMUSCULAR | Status: DC | PRN
Start: 2022-04-12 — End: 2022-04-12
  Administered 2022-04-12 (×4): 25 ug via INTRAVENOUS

## 2022-04-12 MED ORDER — ALBUTEROL SULFATE HFA 108 (90 BASE) MCG/ACT IN AERS
INHALATION_SPRAY | RESPIRATORY_TRACT | Status: AC
  Filled 2022-04-12: qty 6.7

## 2022-04-12 MED ORDER — MIDAZOLAM HCL 2 MG/2ML IJ SOLN
INTRAMUSCULAR | Status: AC
  Filled 2022-04-12: qty 2

## 2022-04-12 MED ORDER — ASPIRIN 81 MG PO TBEC: 81.0000 mg | DELAYED_RELEASE_TABLET | Freq: Every evening | ORAL | 12 refills | Status: AC

## 2022-04-12 MED ORDER — LIDOCAINE HCL (PF) 2 % IJ SOLN
INTRAMUSCULAR | Status: AC
  Filled 2022-04-12: qty 10

## 2022-04-12 MED ORDER — DOCUSATE SODIUM 100 MG PO CAPS: 100.0000 mg | ORAL_CAPSULE | Freq: Two times a day (BID) | ORAL | 0 refills | Status: AC

## 2022-04-12 MED ORDER — FENTANYL CITRATE PF 50 MCG/ML IJ SOSY: 25.0000 ug | PREFILLED_SYRINGE | INTRAMUSCULAR | Status: DC | PRN

## 2022-04-12 MED ORDER — LIDOCAINE 2% (20 MG/ML) 5 ML SYRINGE
INTRAMUSCULAR | Status: DC | PRN
  Administered 2022-04-12: 60 mg via INTRAVENOUS

## 2022-04-12 MED ORDER — BUPIVACAINE HCL (PF) 0.5 % IJ SOLN
INTRAMUSCULAR | Status: AC
  Filled 2022-04-12: qty 30

## 2022-04-12 MED ORDER — OXYCODONE HCL 5 MG PO TABS: 5.0000 mg | ORAL_TABLET | Freq: Once | ORAL | Status: DC | PRN

## 2022-04-12 MED ORDER — MIDAZOLAM HCL 5 MG/5ML IJ SOLN
INTRAMUSCULAR | Status: DC | PRN
  Administered 2022-04-12: 2 mg via INTRAVENOUS

## 2022-04-12 MED ORDER — FENTANYL CITRATE (PF) 100 MCG/2ML IJ SOLN
INTRAMUSCULAR | Status: AC
  Filled 2022-04-12: qty 2

## 2022-04-12 MED ORDER — CEFAZOLIN SODIUM-DEXTROSE 2-4 GM/100ML-% IV SOLN
2.0000 g | INTRAVENOUS | Status: AC
  Administered 2022-04-12: 2 g via INTRAVENOUS
  Filled 2022-04-12: qty 100

## 2022-04-12 MED ORDER — 0.9 % SODIUM CHLORIDE (POUR BTL) OPTIME
TOPICAL | Status: DC | PRN
  Administered 2022-04-12: 1000 mL

## 2022-04-12 MED ORDER — OXYCODONE HCL 5 MG PO TABS: 5.0000 mg | ORAL_TABLET | Freq: Three times a day (TID) | ORAL | 0 refills | Status: DC | PRN

## 2022-04-12 MED ORDER — PROPOFOL 10 MG/ML IV BOLUS
INTRAVENOUS | Status: AC
  Filled 2022-04-12: qty 20

## 2022-04-12 MED ORDER — LACTATED RINGERS IV SOLN: INTRAVENOUS | Status: DC | PRN

## 2022-04-12 MED ORDER — ONDANSETRON HCL 4 MG/2ML IJ SOLN
INTRAMUSCULAR | Status: AC
  Filled 2022-04-12: qty 2

## 2022-04-12 MED ORDER — PROPOFOL 10 MG/ML IV BOLUS
INTRAVENOUS | Status: DC | PRN
  Administered 2022-04-12: 20 mg via INTRAVENOUS
  Administered 2022-04-12: 30 mg via INTRAVENOUS
  Administered 2022-04-12: 20 mg via INTRAVENOUS

## 2022-04-12 MED ORDER — LABETALOL HCL 5 MG/ML IV SOLN
INTRAVENOUS | Status: AC
  Filled 2022-04-12: qty 4

## 2022-04-12 SURGICAL SUPPLY — 32 items
ADH SKN CLS APL DERMABOND .7 (GAUZE/BANDAGES/DRESSINGS) ×1
APL PRP STRL LF ISPRP CHG 10.5 (MISCELLANEOUS) ×1
APPLICATOR CHLORAPREP 10.5 ORG (MISCELLANEOUS) ×1 IMPLANT
CLOTH BEACON ORANGE TIMEOUT ST (SAFETY) ×1 IMPLANT
COVER LIGHT HANDLE STERIS (MISCELLANEOUS) ×2 IMPLANT
DECANTER SPIKE VIAL GLASS SM (MISCELLANEOUS) ×1 IMPLANT
DERMABOND ADVANCED (GAUZE/BANDAGES/DRESSINGS) ×1
DERMABOND ADVANCED .7 DNX12 (GAUZE/BANDAGES/DRESSINGS) IMPLANT
DRAPE EENT ADH APERT 31X51 STR (DRAPES) IMPLANT
ELECT NDL TIP 2.8 STRL (NEEDLE) IMPLANT
ELECT NEEDLE TIP 2.8 STRL (NEEDLE) ×1 IMPLANT
ELECT REM PT RETURN 9FT ADLT (ELECTROSURGICAL) ×1
ELECTRODE REM PT RTRN 9FT ADLT (ELECTROSURGICAL) ×1 IMPLANT
GLOVE BIOGEL PI IND STRL 6.5 (GLOVE) ×1 IMPLANT
GLOVE BIOGEL PI IND STRL 7.0 (GLOVE) ×2 IMPLANT
GLOVE BIOGEL PI INDICATOR 6.5 (GLOVE) ×1
GLOVE BIOGEL PI INDICATOR 7.0 (GLOVE) ×4
GLOVE SURG SS PI 6.5 STRL IVOR (GLOVE) ×2 IMPLANT
GOWN STRL REUS W/TWL LRG LVL3 (GOWN DISPOSABLE) ×2 IMPLANT
KIT TURNOVER KIT A (KITS) ×1 IMPLANT
MANIFOLD NEPTUNE II (INSTRUMENTS) ×1 IMPLANT
NDL HYPO 25X1 1.5 SAFETY (NEEDLE) ×1 IMPLANT
NEEDLE HYPO 25X1 1.5 SAFETY (NEEDLE) ×1 IMPLANT
NS IRRIG 1000ML POUR BTL (IV SOLUTION) ×1 IMPLANT
PACK MINOR (CUSTOM PROCEDURE TRAY) IMPLANT
PAD ARMBOARD 7.5X6 YLW CONV (MISCELLANEOUS) ×1 IMPLANT
SET BASIN LINEN APH (SET/KITS/TRAYS/PACK) ×1 IMPLANT
SUT ETHILON 3 0 FSL (SUTURE) IMPLANT
SUT MNCRL AB 4-0 PS2 18 (SUTURE) ×1 IMPLANT
SUT VIC AB 3-0 SH 27 (SUTURE) ×1
SUT VIC AB 3-0 SH 27X BRD (SUTURE) ×1 IMPLANT
SYR CONTROL 10ML LL (SYRINGE) ×1 IMPLANT

## 2022-04-12 NOTE — Anesthesia Procedure Notes (Signed)
Procedure Name: MAC Date/Time: 04/12/2022 12:30 PM  Performed by: Karna Dupes, CRNAPre-anesthesia Checklist: Patient identified, Emergency Drugs available, Suction available and Patient being monitored Patient Re-evaluated:Patient Re-evaluated prior to induction Oxygen Delivery Method: Simple face mask Induction Type: IV induction

## 2022-04-12 NOTE — Progress Notes (Signed)
Update Note:  Spoke with the patient's husband in the consultation room.  I explained that she tolerated the procedure without issue.  She has dissolvable stiches under the skin and overlying skin glue that will flake off in 10-14 days.  I have discharged her home with a small prescription for narcotic pain medications.  She should take this as needed for pain, and take a stool softener if she is taking this medication.  She can also take tylenol for the pain.  I will call her next week to make sure that she is doing well.  All questions were answered to his expressed satisfaction.  Graciella Freer, DO [facility] [address] [place], [place] [address] [phone] (office)

## 2022-04-12 NOTE — Interval H&P Note (Signed)
History and Physical Interval Note:  04/12/2022 12:09 PM  Nancy [name]  has presented today for surgery, with the diagnosis of LEFT AXILLARY MASS/ CYST, 1.5 CM.  The various methods of treatment have been discussed with the patient and family. After consideration of risks, benefits and other options for treatment, the patient has consented to  Procedure(s): EXCISION MASS (Left) as a surgical intervention.  The patient's history has been reviewed, patient examined, no change in status, stable for surgery.  I have reviewed the patient's chart and labs.  Questions were answered to the patient's satisfaction.     [place]

## 2022-04-12 NOTE — Transfer of Care (Signed)
Immediate Anesthesia Transfer of Care Note  Patient: Nancy [name]  Procedure(s) Performed: EXCISION MASS (Left)  Patient Location: Short Stay  Anesthesia Type:General  Level of Consciousness: awake, alert , oriented and patient cooperative  Airway & Oxygen Therapy: Patient Spontanous Breathing  Post-op Assessment: Report given to RN, Post -op Vital signs reviewed and stable and Patient moving all extremities  Post vital signs: Reviewed and stable  Last Vitals:  Vitals Value Taken Time  BP 107/73 04/12/22 1315  Temp 36.7 C 04/12/22 1315  Pulse 68 04/12/22 1315  Resp 12 04/12/22 1315  SpO2 98 % 04/12/22 1315    Last Pain:  Vitals:   04/12/22 1315  TempSrc: Oral  PainSc: 0-No pain      Patients Stated Pain Goal: 5 (25/63/89 [id number])  Complications: No notable events documented.

## 2022-04-12 NOTE — Discharge Instructions (Signed)
Ambulatory Surgery Discharge Instructions  General Anesthesia or Sedation Do not drive or operate heavy machinery for 24 hours.  Do not consume alcohol, tranquilizers, sleeping medications, or any non-prescribed medications for 24 hours. Do not make important decisions or sign any important papers in the next 24 hours. You should have someone with you tonight at home.  Activity  You are advised to go directly home from the hospital.  Restrict your activities and rest for a day.  Resume light activity tomorrow. No heavy lifting over 10 lbs or strenuous exercise.  Fluids and Diet Begin with clear liquids, bouillon, dry toast, soda crackers.  If not nauseated, you may go to a regular diet when you desire.  Greasy and spicy foods are not advised.  Medications  If you have not had a bowel movement in 24 hours, take 2 tablespoons over the counter Milk of mag.             You May resume your blood thinners tomorrow (Aspirin, coumadin, or other).  You are being discharged with prescriptions for Opioid/Narcotic Medications: There are some specific considerations for these medications that you should know. Opioid Meds have risks & benefits. Addiction to these meds is always a concern with prolonged use Take medication only as directed Do not drive while taking narcotic pain medication Do not crush tablets or capsules Do not use a different container than medication was dispensed in Lock the container of medication in a cool, dry place out of reach of children and pets. Opioid medication can cause addiction Do not share with anyone else (this is a felony) Do not store medications for future use. Dispose of them properly.     Disposal:  Find a [place] household drug take back site near you.  If you can't get to a drug take back site, use the recipe below as a last resort to dispose of expired, unused or unwanted drugs. Disposal  (Do not dispose chemotherapy drugs this way, talk to your  prescribing doctor instead.) Step 1: Mix drugs (do not crush) with dirt, kitty litter, or used coffee grounds and add a small amount of water to dissolve any solid medications. Step 2: Seal drugs in plastic bag. Step 3: Place plastic bag in trash. Step 4: Take prescription container and scratch out personal information, then recycle or throw away.  Operative Site  You have a liquid bandage over your incisions, this will begin to flake off in about a week. Ok to shower tomorrow. Keep wound clean and dry. No baths or swimming. No lifting more than 10 pounds.  Contact Information: If you have questions or concerns, please call our office, [phone], Monday- Thursday 8AM-5PM and Friday 8AM-12Noon.  If it is after hours or on the weekend, please call [facility] Main Number, [phone], and ask to speak to the surgeon on call for Dr. Mohd Clemons at [place].   SPECIFIC COMPLICATIONS TO WATCH FOR: Inability to urinate Fever over 101? F by mouth Nausea and vomiting lasting longer than 24 hours. Pain not relieved by medication ordered Swelling around the operative site Increased redness, warmth, hardness, around operative area Numbness, tingling, or cold fingers or toes Blood -soaked dressing, (small amounts of oozing may be normal) Increasing and progressive drainage from surgical area or exam site  

## 2022-04-12 NOTE — Anesthesia Preprocedure Evaluation (Signed)
Anesthesia Evaluation  Patient identified by MRN, date of birth, ID band Patient awake    Reviewed: Allergy & Precautions, H&P , NPO status , Patient's Chart, lab work & pertinent test results, reviewed documented beta blocker date and time   History of Anesthesia Complications (+) PONV and history of anesthetic complications  Airway Mallampati: II  TM Distance: >3 FB Neck ROM: full    Dental no notable dental hx.    Pulmonary neg pulmonary ROS,    Pulmonary exam normal breath sounds clear to auscultation       Cardiovascular Exercise Tolerance: Good negative cardio ROS   Rhythm:regular Rate:Normal     Neuro/Psych negative neurological ROS  negative psych ROS   GI/Hepatic negative GI ROS, Neg liver ROS,   Endo/Other  diabetes, Type 2Hypothyroidism   Renal/GU negative Renal ROS  negative genitourinary   Musculoskeletal   Abdominal   Peds  Hematology negative hematology ROS (+)   Anesthesia Other Findings   Reproductive/Obstetrics negative OB ROS                             Anesthesia Physical Anesthesia Plan  ASA: 2  Anesthesia Plan: General and General ETT   Post-op Pain Management:    Induction:   PONV Risk Score and Plan: Ondansetron  Airway Management Planned:   Additional Equipment:   Intra-op Plan:   Post-operative Plan:   Informed Consent: I have reviewed the patients History and Physical, chart, labs and discussed the procedure including the risks, benefits and alternatives for the proposed anesthesia with the patient or authorized representative who has indicated his/her understanding and acceptance.     Dental Advisory Given  Plan Discussed with: CRNA  Anesthesia Plan Comments:         Anesthesia Quick Evaluation

## 2022-04-12 NOTE — Op Note (Addendum)
[facility] Operative Note  04/12/22  Preoperative Diagnosis: Left axillary mass   Postoperative Diagnosis: Same   Procedure(s) Performed: Excision of left axillary mass   Surgeon: Graciella Freer, DO    Assistants: Shirlyn Goltz, MS3    Anesthesia: IV sedation   Anesthesiologist: Louann Sjogren, MD    Specimens: Left axillary mass   Estimated Blood Loss: Minimal   Blood Replacement: None    Complications: None   Wound Class: Clean   Operative Indications: Patient is a 56 year old female who presents for left axillary mass excision.  This area has required I&D in the past, and she would like it removed at this time.  All risks and benefits of performing this procedure were discussed with the patient including pain, infection, bleeding, damage to the surrounding structures, and need for more procedures or surgery. The patient voiced understanding of the procedure, all questions were sought and answered, and consent was obtained.  Findings: left axillary mass, likely sebaceous cyst - 1 cm in size   Procedure: The patient was taken to the operating room and placed supine. IV sedation was induced. Intravenous antibiotics were administered per protocol.  The left axilla was prepared and draped in the usual sterile fashion.   A linear incision was made overlying the mass. Using electrocautery and sharp dissection, the dermis and subcutaneous tissues were dissected around the mass. The mass was removed in its entirety and sent to pathology for evaluation. Hemostasis was achieved using electrocautery. The dermis was approximated using 3-0 Vicryl sutures in a interrupted fashion. The skin was closed using a 4-0 Monocryl in a subcuticular fashion. The incision was dressed with Dermabond.   Final inspection revealed acceptable hemostasis. All counts were correct at the end of the case. The patient was awakened from anesthesia without complication.  The patient  went to the PACU in stable condition.   Graciella Freer, DO  [facility] [address] [place], [place] [address] [phone] (office)

## 2022-04-13 DIAGNOSIS — R2232 Localized swelling, mass and lump, left upper limb: Secondary | ICD-10-CM

## 2022-04-13 LAB — SURGICAL PATHOLOGY

## 2022-04-13 NOTE — Anesthesia Postprocedure Evaluation (Signed)
Anesthesia Post Note  Patient: Nancy [name]  Procedure(s) Performed: EXCISION MASS (Left: Axilla)  Patient location during evaluation: Phase II Anesthesia Type: General Level of consciousness: awake Pain management: pain level controlled Vital Signs Assessment: post-procedure vital signs reviewed and stable Respiratory status: spontaneous breathing and respiratory function stable Cardiovascular status: blood pressure returned to baseline and stable Postop Assessment: no headache and no apparent nausea or vomiting Anesthetic complications: no Comments: Late entry   No notable events documented.   Last Vitals:  Vitals:   04/12/22 1028 04/12/22 1315  BP: 128/75 107/73  Pulse:  68  Resp: 17 12  Temp: 36.5 C 36.7 C  SpO2: 99% 98%    Last Pain:  Vitals:   04/13/22 1451  TempSrc:   PainSc: 0-No pain                 Louann Sjogren

## 2022-04-18 ENCOUNTER — Encounter (HOSPITAL_COMMUNITY): Payer: Self-pay | Admitting: Surgery

## 2022-04-20 ENCOUNTER — Ambulatory Visit (INDEPENDENT_AMBULATORY_CARE_PROVIDER_SITE_OTHER): Payer: HMO | Admitting: Surgery

## 2022-04-20 DIAGNOSIS — Z09 Encounter for follow-up examination after completed treatment for conditions other than malignant neoplasm: Secondary | ICD-10-CM

## 2022-04-20 NOTE — Progress Notes (Signed)
[facility]  I am calling the patient for post operative evaluation. This is not a billable encounter as it is under the [place] charges for the surgery.  The patient had a excision of left axillary mass on 8/23. The patient reports that she is doing well. She is tolerating a diet, having good pain control, and having regular Bms.  The incision is healing well; most of the dermabond is off.  Advised that she can use antibiotic ointment to help get the remaining dermabond off.  Advised that she should hold off on using deodorant for 4 weeks after the surgery. The patient has no concerns.   Pathology: A. AXILLARY, LEFT, CYST, EXCISION:  - Benign epidermal cyst.   Will see the patient PRN.   Graciella Freer, DO [facility] [address] [place], [place] [address] [phone] (office)

## 2022-04-28 ENCOUNTER — Telehealth: Payer: Self-pay | Admitting: *Deleted

## 2022-04-28 MED ORDER — FLUCONAZOLE 150 MG PO TABS: ORAL_TABLET | ORAL | 0 refills | Status: DC

## 2022-04-28 NOTE — Telephone Encounter (Signed)
Received call from patient [phone]~ telephone.   Surgical Date: 04/12/2022 Procedure: Excision mass, L Axilla  Patient reports red irritated rash to axilla. Advised to come to office for writer to assess.   Patient seen in office for evaluation. Noted that irritation appears to be related to increased moisture in axilla since patient cannot use deodorant at this time. Patient states that she has been using Neosporin on rash, but redness is not improving.   Also noted that incision had re-opened. Noted tissue healing well, but wound bed did have some lint, a strand of hair, and part of the internal suture was poking out. Removed lint and hair with sterile forceps. Clipped clear suture that was irritating surrounding skin.   Advised to keep skin as dry as possible. Advised to use 4 x 4 gauze to assist in wicking moisture away from skin. Discussed infection S/ Sx and to contact office if noted.   Discussed with Dr. Constance Haw as Dr. Okey Dupre is out of the office. Recommendations are as follows: Diflucan '150mg'$  tabs PO x1 today and repeat in 72 hours. Avoid topical creams or ointments as this will increase moisture.  Schedule follow up with Dr. Okey Dupre for next week.   Of note, Diflucan may increase serum levels of statin medications, which may lead increased risk of liver damage or rhabdomyolysis. Advised to hold atorvastatin on the days that she is taking Diflucan.   Call placed to patient and patient made aware. Agreeable to plan. Prescription sent to pharmacy. Appointment scheduled. Patient also requested that above instructions be sent via [place].

## 2022-05-03 ENCOUNTER — Ambulatory Visit (INDEPENDENT_AMBULATORY_CARE_PROVIDER_SITE_OTHER): Payer: HMO | Admitting: Surgery

## 2022-05-03 ENCOUNTER — Encounter: Payer: Self-pay | Admitting: Surgery

## 2022-05-03 VITALS — BP 124/79 | HR 86 | Temp 97.6°F | Resp 14 | Ht 66.0 in | Wt 214.0 lb

## 2022-05-03 DIAGNOSIS — T8130XA Disruption of wound, unspecified, initial encounter: Secondary | ICD-10-CM

## 2022-05-03 NOTE — Progress Notes (Signed)
[facility] Note   HPI:  56 y.o. Female presents to clinic for post-op follow-up status post left axillary cyst excision with subsequent wound dehiscence and yeast skin rash.  She denies any significant drainage from the wound site.  Her rash has also improved.  She denies any fevers or chills.  She was given 2 doses of Diflucan last week.  Review of Systems:  All other review of systems: otherwise negative   Vital Signs:  There were no vitals taken for this visit.   Physical Exam:  Physical Exam Vitals reviewed.  Constitutional:      Appearance: Normal appearance.  Skin:    Comments: Left axillary incision site open with fibrinous exudate at the base, no surrounding erythema or induration; left axillary skin with healing skin rash  Neurological:     Mental Status: She is alert.    Laboratory studies: None  Imaging:  None  Assessment:  56 y.o. yo Female who presents for follow-up status post left axillary cyst excision with subsequent wound dehiscence  Plan:  -I performed bedside debridement of the left axillary wound, removing the overlying fibrinous exudate getting it down to healthy granulation tissue -I then packed the wound with a small amount of damp gauze and covered with a Band-Aid -Advised the patient to perform these dressing changes daily, though she will likely not be able to pack much within a few days -Advised her to keep the area clean and dry.  She may shower over this wound -Follow up in 2 weeks  All of the above recommendations were discussed with the patient, and all of patient's questions were answered to her expressed satisfaction.  Graciella Freer, DO [facility] [address] [place], [place] [address] [phone] (office)

## 2022-05-18 ENCOUNTER — Ambulatory Visit (INDEPENDENT_AMBULATORY_CARE_PROVIDER_SITE_OTHER): Payer: HMO | Admitting: Surgery

## 2022-05-18 VITALS — BP 120/86 | HR 89 | Temp 97.8°F | Resp 16 | Ht 66.0 in | Wt 213.0 lb

## 2022-05-18 DIAGNOSIS — T8130XA Disruption of wound, unspecified, initial encounter: Secondary | ICD-10-CM

## 2022-05-18 NOTE — Progress Notes (Signed)
[facility] Note   HPI:  56 y.o. Female presents to clinic for post-op follow-up status post left axillary cyst excision with wound dehiscence.  She has been packing the area daily, the amount of packing that she has been inserting has continued to decrease.  Her associated skin rash has also improved.  She denies any significant drainage from the area.  She denies any fevers or chills.  Review of Systems:  All other review of systems: otherwise negative   Vital Signs:  BP 120/86   Pulse 89   Temp 97.8 F (36.6 C) (Oral)   Resp 16   Ht '5\' 6"'$  (1.676 m)   Wt 213 lb (96.6 kg)   SpO2 95%   BMI 34.38 kg/m    Physical Exam:  Physical Exam Vitals reviewed.  Constitutional:      Appearance: Normal appearance.  Skin:    Comments: Left axillary wound with pink granulation tissue at the base, <1 cm in size, no surrounding erythema or induration; left axillary skin with healing skin rash   Neurological:     Mental Status: She is alert.    Laboratory studies: None  Imaging:  None  Assessment:  56 y.o. yo Female who presents for follow-up status post left axillary cyst excision with subsequent wound dehiscence  Plan:  -Her wound is healing well with pink granulation tissue at the base, and is now less than 1 cm in size -Advised her to keep the area clean and apply antibiotic ointment daily to see if this helps to allow this area to fully heal -We will try to minimize bandages, as her skin is getting irritated from the adhesive -Follow up with me in 2 to 3 weeks  All of the above recommendations were discussed with the patient, and all of patient's  questions were answered to her expressed satisfaction.  Graciella Freer, DO [facility] [address] [place], [place] [address] [phone] (office)

## 2022-06-08 ENCOUNTER — Encounter: Payer: Self-pay | Admitting: Surgery

## 2022-06-08 ENCOUNTER — Ambulatory Visit (INDEPENDENT_AMBULATORY_CARE_PROVIDER_SITE_OTHER): Payer: HMO | Admitting: Surgery

## 2022-06-08 VITALS — BP 97/49 | HR 78 | Temp 97.8°F | Resp 14 | Ht 66.0 in | Wt 212.0 lb

## 2022-06-08 DIAGNOSIS — T8130XA Disruption of wound, unspecified, initial encounter: Secondary | ICD-10-CM

## 2022-06-09 NOTE — Progress Notes (Signed)
[facility] Note   HPI:  56 y.o. Female presents to clinic for post-op follow-up status post left axillary cyst excision with wound dehiscence.  She has been doing very well since she was last seen by me.  She has not needed to pack the area any further.  She denies any pain, drainage, fever, and chills.  Review of Systems:  All other review of systems: otherwise negative   Vital Signs:  BP (!) 97/49   Pulse 78   Temp 97.8 F (36.6 C) (Oral)   Resp 14   Ht '5\' 6"'$  (1.676 m)   Wt 212 lb (96.2 kg)   SpO2 95%   BMI 34.22 kg/m    Physical Exam:  Physical Exam Vitals reviewed.  Constitutional:      Appearance: Normal appearance.  Skin:    Comments: Left axillary surgical site well-healed with overlying skin, no erythema or induration  Neurological:     Mental Status: She is alert.     Laboratory studies: None  Imaging:  None  Assessment:  56 y.o. yo Female who presents for follow-up status post left axillary cyst excision with subsequent wound dehiscence  Plan:  -Area has healed very well -Advised that she not apply any deodorant for an additional month to allow the area to completely heal -Follow-up with me as needed  All of the above recommendations were discussed with the patient, and all of patient's questions were answered to her expressed satisfaction.  Graciella Freer, DO [facility] [address] [place], [place] [address] [phone] (office)

## 2022-06-16 ENCOUNTER — Ambulatory Visit (INDEPENDENT_AMBULATORY_CARE_PROVIDER_SITE_OTHER): Payer: HMO

## 2022-06-16 ENCOUNTER — Encounter: Payer: Self-pay | Admitting: Orthopedic Surgery

## 2022-06-16 ENCOUNTER — Ambulatory Visit (INDEPENDENT_AMBULATORY_CARE_PROVIDER_SITE_OTHER): Payer: HMO | Admitting: Orthopedic Surgery

## 2022-06-16 VITALS — BP 135/92 | HR 86 | Ht 66.0 in | Wt 208.0 lb

## 2022-06-16 DIAGNOSIS — M7581 Other shoulder lesions, right shoulder: Secondary | ICD-10-CM | POA: Diagnosis not present

## 2022-06-16 DIAGNOSIS — M25511 Pain in right shoulder: Secondary | ICD-10-CM

## 2022-06-16 DIAGNOSIS — G8929 Other chronic pain: Secondary | ICD-10-CM | POA: Diagnosis not present

## 2022-06-16 NOTE — Patient Instructions (Signed)
Consider medications like ibuprofen or naproxen as needed for pain  Can use lotions, creams sprays or patches.  These are available at the pharmacy, over-the-counter.  Ice the shoulder.  Remain active  Exercises are listed below   Instructions Following Joint Injections  In clinic today, you received an injection in one of your joints (sometimes more than one).  Occasionally, you can have some pain at the injection site, this is normal.  You can place ice at the injection site, or take over-the-counter medications such as Tylenol (acetaminophen) or Advil (ibuprofen).  Please follow all directions listed on the bottle.  If your joint (knee or shoulder) becomes swollen, red or very painful, please contact the clinic for additional assistance.   Two medications were injected, including lidocaine and a steroid (often referred to as cortisone).  Lidocaine is effective almost immediately but wears off quickly.  However, the steroid can take a few days to improve your symptoms.  In some cases, it can make your pain worse for a couple of days.  Do not be concerned if this happens as it is common.  You can apply ice or take some over-the-counter medications as needed.     Rotator Cuff Tear/Tendinitis Rehab   Ask your health care provider which exercises are safe for you. Do exercises exactly as told by your health care provider and adjust them as directed. It is normal to feel mild stretching, pulling, tightness, or discomfort as you do these exercises. Stop right away if you feel sudden pain or your pain gets worse. Do not begin these exercises until told by your health care provider. Stretching and range-of-motion exercises  These exercises warm up your muscles and joints and improve the movement and flexibility of your shoulder. These exercises also help to relieve pain.  Shoulder pendulum In this exercise, you let the injured arm dangle toward the floor and then swing it like a clock  pendulum. Stand near a table or counter that you can hold onto for balance. Bend forward at the waist and let your left / right arm hang straight down. Use your other arm to support you and help you stay balanced. Relax your left / right arm and shoulder muscles, and move your hips and your trunk so your left / right arm swings freely. Your arm should swing because of the motion of your body, not because you are using your arm or shoulder muscles. Keep moving your hips and trunk so your arm swings in the following directions, as told by your health care provider: Side to side. Forward and backward. In clockwise and counterclockwise circles. Slowly return to the starting position. Repeat 10 times, or for 10 seconds per direction. Complete this exercise 2-3 times a day.      Shoulder flexion, seated This exercise is sometimes called table slides. In this exercise, you raise your arm in front of your body until you feel a stretch in your injured shoulder. Sit in a stable chair so your left / right forearm can rest on a flat surface. Your elbow should rest at a height that keeps your upper arm next to your body. Keeping your left / right shoulder relaxed, lean forward at the waist and let your hand slide forward (flexion). Stop when you feel a stretch in your shoulder, or when you reach the angle that is recommended by your health care provider. Hold for 5 seconds. Slowly return to the starting position. Repeat 10 times. Complete this exercise 1-2  times a day.       Shoulder flexion, standing In this exercise, you raise your arm in front of your body (flexion) until you feel a stretch in your injured shoulder. Stand and hold a broomstick, a cane, or a similar object. Place your hands a little more than shoulder-width apart on the object. Your left / right hand should be palm-up, and your other hand should be palm-down. Keep your elbow straight and your shoulder muscles relaxed. Push the stick up  with your healthy arm to raise your left / right arm in front of your body, and then over your head until you feel a stretch in your shoulder. Avoid shrugging your shoulder while you raise your arm. Keep your shoulder blade tucked down toward the middle of your back. Keep your left / right shoulder muscles relaxed. Hold for 10 seconds. Slowly return to the starting position. Repeat 10 times. Complete this exercise 1-2 times a day.      Shoulder abduction, active-assisted You will need a stick, broom handle, or similar object to help you (assist) in doing this exercise. Lie on your back. This is the supine position. Hold a broomstick, a cane, or a similar object. Place your hands a little more than shoulder-width apart on the object. Your left / right hand should be palm-up, and your other hand should be palm-down. Keeping your shoulder relaxed, push the stick to raise your left / right arm out to your side (abduction) and then over your head. Use your other hand to help move the stick. Stop when you feel a stretch in your shoulder, or when you reach the angle that is recommended by your health care provider. Avoid shrugging your shoulder while you raise your arm. Keep your shoulder blade tucked down toward the middle of your back. Hold for 10 seconds. Slowly return to the starting position. Repeat 10 times. Complete this exercise 1-2 times a day.      Shoulder flexion, active-assisted Lie on your back. You may bend your knees for comfort. Hold a broomstick, a cane, or a similar object so that your hands are about shoulder-width apart. Your palms should face toward your feet. Raise your left / right arm over your head, then behind your head toward the floor (flexion). Use your other hand to help you do this (active-assisted). Stop when you feel a gentle stretch in your shoulder, or when you reach the angle that is recommended by your health care provider. Hold for 10 seconds. Use the stick and  your other arm to help you return your left / right arm to the starting position. Repeat 10 times. Complete this exercise 1-2 times a day.      External rotation Sit in a stable chair without armrests, or stand up. Tuck a soft object, such as a folded towel or a small ball, under your left / right upper arm. Hold a broomstick, a cane, or a similar object with your palms face-down, toward the floor. Bend your elbows to a 90-degree angle (right angle), and keep your hands about shoulder-width apart. Straighten your healthy arm and push the stick across your body, toward your left / right side. Keep your left / right arm bent. This will rotate your left / right forearm away from your body (external rotation). Hold for 10 seconds. Slowly return to the starting position. Repeat 10 times. Complete this exercise 1-2 times a day.        Strengthening exercises These exercises build  strength and endurance in your shoulder. Endurance is the ability to use your muscles for a long time, even after they get tired. Do not start doing these exercises until your health care provider approves. Shoulder flexion, isometric Stand or sit in a doorway, facing the door frame. Keep your left / right arm straight and make a gentle fist with your hand. Place your fist against the door frame. Only your fist should be touching the frame. Keep your upper arm at your side. Gently press your fist against the door frame, as if you are trying to raise your arm above your head (isometric shoulder flexion). Avoid shrugging your shoulder while you press your hand into the door frame. Keep your shoulder blade tucked down toward the middle of your back. Hold for 10 seconds. Slowly release the tension, and relax your muscles completely before you repeat the exercise. Repeat 10 times. Complete this exercise 3 times per week.      Shoulder abduction, isometric Stand or sit in a doorway. Your left / right arm should be closest to  the door frame. Keep your left / right arm straight, and place the back of your hand against the door frame. Only your hand should be touching the frame. Keep the rest of your arm close to your side. Gently press the back of your hand against the door frame, as if you are trying to raise your arm out to the side (isometric shoulder abduction). Avoid shrugging your shoulder while you press your hand into the door frame. Keep your shoulder blade tucked down toward the middle of your back. Hold for 10 seconds. Slowly release the tension, and relax your muscles completely before you repeat the exercise. Repeat 10 times. Complete this exercise 3 times per week.      Internal rotation, isometric This is an exercise in which you press your palm against a door frame without moving your shoulder joint (isometric). Stand or sit in a doorway, facing the door frame. Bend your left / right elbow, and place the palm of your hand against the door frame. Only your palm should be touching the frame. Keep your upper arm at your side. Gently press your hand against the door frame, as if you are trying to push your arm toward your abdomen (internal rotation). Gradually increase the pressure until you are pressing as hard as you can. Stop increasing the pressure if you feel shoulder pain. Avoid shrugging your shoulder while you press your hand into the door frame. Keep your shoulder blade tucked down toward the middle of your back. Hold for 10 seconds. Slowly release the tension, and relax your muscles completely before you repeat the exercise. Repeat 10 times. Complete this exercise 3 times per week.      External rotation, isometric This is an exercise in which you press the back of your wrist against a door frame without moving your shoulder joint (isometric). Stand or sit in a doorway, facing the door frame. Bend your left / right elbow and place the back of your wrist against the door frame. Only the back of  your wrist should be touching the frame. Keep your upper arm at your side. Gently press your wrist against the door frame, as if you are trying to push your arm away from your abdomen (external rotation). Gradually increase the pressure until you are pressing as hard as you can. Stop increasing the pressure if you feel pain. Avoid shrugging your shoulder while you press your  wrist into the door frame. Keep your shoulder blade tucked down toward the middle of your back. Hold for 10 seconds. Slowly release the tension, and relax your muscles completely before you repeat the exercise. Repeat 10 times. Complete this exercise 3 times per week.       Scapular retraction Sit in a stable chair without armrests, or stand up. Secure an exercise band to a stable object in front of you so the band is at shoulder height. Hold one end of the exercise band in each hand. Your palms should face down. Squeeze your shoulder blades together (retraction) and move your elbows slightly behind you. Do not shrug your shoulders upward while you do this. Hold for 10 seconds. Slowly return to the starting position. Repeat 10 times. Complete this exercise 3 times per week.      Shoulder extension Sit in a stable chair without armrests, or stand up. Secure an exercise band to a stable object in front of you so the band is above shoulder height. Hold one end of the exercise band in each hand. Straighten your elbows and lift your hands up to shoulder height. Squeeze your shoulder blades together and pull your hands down to the sides of your thighs (extension). Stop when your hands are straight down by your sides. Do not let your hands go behind your body. Hold for 10 seconds. Slowly return to the starting position. Repeat 10 times. Complete this exercise 3 times per week.       Scapular protraction, supine Lie on your back on a firm surface (supine position). Hold a 5 lbs (or soup can) weight in your left / right  hand. Raise your left / right arm straight into the air so your hand is directly above your shoulder joint. Push the weight into the air so your shoulder (scapula) lifts off the surface that you are lying on. The scapula will push up or forward (protraction). Do not move your head, neck, or back. Hold for 10 seconds. Slowly return to the starting position. Let your muscles relax completely before you repeat this exercise.  Repeat 10 times. Complete this exercise 3 times per week.

## 2022-06-16 NOTE — Progress Notes (Signed)
New Patient Visit  Assessment: Nancy [name] is a 56 y.o. RHD female with the following: 1. Tendinitis of right rotator cuff  Plan: Nancy [name] has pain in her right shoulder.  No specific injury.  Difficulty with overhead motion.  She has decent motion.  Good strength.  This most likely represents tendinitis.  I have offered her a steroid injection, and she would like to proceed.  This was completed in clinic today without issues.  I have provided her with home exercises.  She will contact the clinic if she has further issues.    Procedure note injection - Right shoulder    Verbal consent was obtained to inject the right shoulder, subacromial space Timeout was completed to confirm the site of injection.   The skin was prepped with alcohol and ethyl chloride was sprayed at the injection site.  A 21-gauge needle was used to inject 40 mg of Depo-Medrol and 1% lidocaine (3 cc) into the subacromial space of the right shoulder using a posterolateral approach.  There were no complications.  A sterile bandage was applied.     Follow-up: Return if symptoms worsen or fail to improve.  Subjective:  Chief Complaint  Patient presents with   Shoulder Pain    Rt shoulder pain for 2-3 mos.     History of Present Illness: Nancy [name] is a 56 y.o. female who presents for evaluation of right shoulder pain.  She has been referred by Asencion Noble, MD.  She states she had pain in the right shoulder for the past 2-3 months.  No specific injury.  Pain is in the anterior and lateral aspect of the shoulder.  Pain gets worse at night.  She has some difficulty with overhead motion.  Medications have not been helpful.  No recent injections.  She has not worked with physical therapy.   Review of Systems: No fevers or chills No numbness or tingling No chest pain No shortness of breath No bowel or bladder dysfunction No GI distress No headaches   Medical History:  Past Medical History:   Diagnosis Date   Arthritis    Back pain    Complication of anesthesia    Diabetes mellitus    Hypothyroidism    PONV (postoperative nausea and vomiting)    Rosacea    Small bowel obstruction (HCC)    Thyroid disease    Ventral hernia     Past Surgical History:  Procedure Laterality Date   BACK SURGERY     x3; fusion with rods and screws   carpatunnel surgery Bilateral    CARPOMETACARPEL SUSPENSION PLASTY Left 05/23/2018   Procedure: CARPOMETACARPEL ([place]) SUSPENSION PLASTY LEFT THUMB;  Surgeon: Daryll Brod, MD;  Location: [place];  Service: Orthopedics;  Laterality: Left;   CARPOMETACARPEL SUSPENSION PLASTY Right 10/03/2018   Procedure: RIGHT THUMB SUSPENSONPLASTY WITH MICRO LINK SUTURE AND TRAPEXIUM EXCISION;  Surgeon: Daryll Brod, MD;  Location: [place];  Service: Orthopedics;  Laterality: Right;  AXILLARY BLOCK   CHOLECYSTECTOMY     COLON SURGERY     COLOSTOMY     ENDOMETRIAL ABLATION     INSERTION OF MESH  08/03/2012   Procedure: INSERTION OF MESH;  Surgeon: Gwenyth Ober, MD;  Location: [place];  Service: General;  Laterality: N/A;   LAPAROSCOPIC BILATERAL SALPINGO OOPHERECTOMY Bilateral 05/12/2015   Procedure: LAPAROSCOPIC BILATERAL SALPINGO OOPHORECTOMY;  Surgeon: Florian Buff, MD;  Location: [facility];  Service: Gynecology;  Laterality: Bilateral;  LAPAROSCOPIC LYSIS OF ADHESIONS Bilateral 05/12/2015   Procedure: EXTENSIVE LAPAROSCOPIC LYSIS OF ADHESIONS;  Surgeon: Florian Buff, MD;  Location: [facility];  Service: Gynecology;  Laterality: Bilateral;   LAPAROTOMY  08/03/2012   Procedure: EXPLORATORY LAPAROTOMY;  Surgeon: Gwenyth Ober, MD;  Location: [place];  Service: General;  Laterality: N/A;  with extensive enterolysis   LIVER BIOPSY     MASS EXCISION Left 04/12/2022   Procedure: EXCISION MASS;  Surgeon: Rusty Aus, DO;  Location: [facility];  Service: General;  Laterality: Left;   REVISION COLOSTOMY     TENDON TRANSFER Left 05/23/2018    Procedure: TENDON TRANSFER WITH TRAPEZIUM EXCISION;  Surgeon: Daryll Brod, MD;  Location: [place];  Service: Orthopedics;  Laterality: Left;   TRIGGER FINGER RELEASE Left 05/23/2018   Procedure: RELEASE TRIGGER FINGER/A-1 PULLEY LEFT THUMB;  Surgeon: Daryll Brod, MD;  Location: [place];  Service: Orthopedics;  Laterality: Left;   TRIGGER FINGER RELEASE Right 09/23/2019   Procedure: RIGHT THUMB RELEASE TRIGGER FINGER/A-1 PULLEY;  Surgeon: Daryll Brod, MD;  Location: [place];  Service: Orthopedics;  Laterality: Right;  IV REGIONAL FOREARM BLOCK   VENTRAL HERNIA REPAIR  08/03/2012   Procedure: HERNIA REPAIR VENTRAL ADULT;  Surgeon: Gwen[place]th Ober, MD;  Location: [place];  Service: General;  Laterality: N/A;   VENTRAL HERNIA REPAIR  03/13/2016   Procedure: INCISIONAL HERNIA REPAIR WITH MESH;  Surgeon: Aviva Signs, MD;  Location: [facility];  Service: General;;    Family History  Problem Relation Age of Onset   Thyroid disease Mother    Heart disease Father    Diabetes Mellitus I Maternal Grandmother    Diabetes Mellitus I Maternal Grandfather    Diabetes Mellitus I Paternal Grandmother    Diabetes Mellitus I Paternal Grandfather    Social History   Tobacco Use   Smoking status: Never   Smokeless tobacco: Never  Vaping Use   Vaping Use: Never used  Substance Use Topics   Alcohol use: No    Alcohol/week: 0.0 standard drinks of alcohol   Drug use: No    Allergies  Allergen Reactions   Chocolate Other (See Comments)    rosacea     No outpatient medications have been marked as taking for the 06/16/22 encounter (Office Visit) with Mordecai Rasmussen, MD.    Objective: BP (!) 135/92   Pulse 86   Ht '5\' 6"'$  (1.676 m)   Wt 208 lb (94.3 kg)   BMI 33.57 kg/m   Physical Exam:  General: Alert and oriented. and No acute distress. Gait: Normal gait.  Right shoulder without deformity.  No atrophy is appreciated.  Positive Neer's.  Decreased  strength of the supraspinatus due to pain.  Positive drop arm test.  Forward flexion limited to 140 degrees.  Negative belly press.  Fingers are warm and well-perfused.  Sensation is intact throughout the right hand.  IMAGING: I personally ordered and reviewed the following images  X-rays of the right shoulder were obtained in clinic today.  No acute injuries are noted.  Glenohumeral joint is reduced.  Well-maintained glenohumeral joint space.  Minimal degenerative changes at the [facility] joint.  Impression: Negative right shoulder x-rays.   New Medications:  No orders of the defined types were placed in this encounter.     Mordecai Rasmussen, MD  06/16/2022 11:01 AM

## 2022-06-17 ENCOUNTER — Encounter: Payer: Self-pay | Admitting: Orthopedic Surgery

## 2022-06-22 ENCOUNTER — Telehealth: Payer: Self-pay | Admitting: Radiology

## 2022-06-22 NOTE — Telephone Encounter (Signed)
Patient called, LMVM saying she wanted to let you all know that as of yesterday 11/1 her shoulder has been feeling some better from injection.

## 2022-06-23 ENCOUNTER — Telehealth: Payer: Self-pay

## 2022-06-23 NOTE — Telephone Encounter (Signed)
Patient left voicemail message stating that the shot finally began to help her some. Stated that her right shoulder is still bothering her.

## 2022-07-11 ENCOUNTER — Encounter: Payer: Self-pay | Admitting: Orthopedic Surgery

## 2022-07-11 ENCOUNTER — Ambulatory Visit (INDEPENDENT_AMBULATORY_CARE_PROVIDER_SITE_OTHER): Payer: HMO | Admitting: Orthopedic Surgery

## 2022-07-11 DIAGNOSIS — M7711 Lateral epicondylitis, right elbow: Secondary | ICD-10-CM

## 2022-07-11 NOTE — Progress Notes (Signed)
Orthopaedic Clinic Return  Assessment: Nancy [name] is a 56 y.o. female with the following: Right elbow lateral epicondylitis  Plan: Constellation of symptoms, and presentation most consistent with right lateral epicondylitis.  This was discussed with the patient.  All questions were answered.  Discussed multiple treatment options, and I am recommending home exercises, as well as a counterforce strap.  She was fitted for this in clinic today.  She will follow-up as needed.  If her pain continues, I would recommend an injection as the next line of treatment.  If her right shoulder continues to bother her, we can consider formal physical therapy versus an MRI.  Follow-up: Return if symptoms worsen or fail to improve.   Subjective:  Chief Complaint  Patient presents with   Elbow Pain    Right elbow pain, NKI, hurts every day x 2 weeks.  Has a lot of night pain.  Throbs.  Tylenol for pain. Lateral epicondyle pain, radiates.  Says her R shld hurts and radiates down.  R shld is feeling some better since injection, but still bothers her.     History of Present Illness: Nancy [name] is a 56 y.o. female who returns to clinic for evaluation of elbow pain.  Over the past couple weeks, she started to have pain in the lateral elbow.  No specific injury.  Tenderness is directly over the lateral elbow.  She has difficulty gripping items as a result.  She has tried topical treatments such as Biofreeze and Voltaren gel, with limited improvement in her symptoms.  No prior injections.  This is never happened to her before  Review of Systems: No fevers or chills No numbness or tingling No chest pain No shortness of breath No bowel or bladder dysfunction No GI distress No headaches   Objective: There were no vitals taken for this visit.  Physical Exam:  Alert and oriented.  No acute distress.  Evaluation of the right elbow demonstrates no bruising.  No swelling.  No redness.  She is  tenderness to palpation directly over the lateral epicondyle.  Pain is recreated with resisted long finger extension.  She has pain with active extension of her wrist.  Pain is worsened with resisted extension of her wrist.  Fingers are warm and well-perfused.  IMAGING: I personally ordered and reviewed the following images:   No new imaging obtained today.  Mordecai Rasmussen, MD 07/11/2022 11:09 AM

## 2022-07-11 NOTE — Patient Instructions (Signed)
[facility] Do exercises exactly as told by your health care provider and adjust them as directed. It is normal to feel mild stretching, pulling, tightness, or discomfort as you do these exercises. Stop right away if you feel sudden pain or your pain gets worse.   Stretching and range-of-motion exercises These exercises warm up your muscles and joints and improve the movement andflexibility of your elbow. Wrist flexion, assisted  Straighten your left / right elbow in front of you with your palm facing down toward the floor. If told by your health care provider, bend your left / right elbow to a 90-degree angle (right angle) at your side instead of holding it straight. With your other hand, gently push over the back of your left / right hand so your fingers point toward the floor (flexion). Stop when you feel a gentle stretch on the back of your forearm. Hold this position for 10 seconds. Repeat 10 times. Complete this exercise 1-2 times a day. Wrist extension, assisted  Straighten your left / right elbow in front of you with your palm facing up toward the ceiling. If told by your health care provider, bend your left / right elbow to a 90-degree angle (right angle) at your side instead of holding it straight. With your other hand, gently pull your left / right hand and fingers toward the floor (extension). Stop when you feel a gentle stretch on the palm side of your forearm. Hold this position for 10 seconds. Repeat 10 times. Complete this exercise 1-2 times a day. Assisted forearm rotation, supination Sit or stand with your elbows at your side. Bend your left / right elbow to a 90-degree angle (right angle). Using your uninjured hand, turn your left / right palm up toward the ceiling (supination) until you feel a gentle stretch along the inside of your forearm. Hold this position for 10 seconds. Repeat 10 times. Complete this exercise 1-2 times a day. Assisted forearm rotation,  pronation Sit or stand with your elbows at your side. Bend your left / right elbow to a 90-degree angle (right angle). Using your uninjured hand, turn your left / right palm down toward the floor (pronation) until you feel a gentle stretch along the outside of your forearm. Hold this position for 10 seconds. Repeat 10 times. Complete this exercise 1-2 times a day. Strengthening exercises These exercises build strength and endurance in your forearm and elbow. Endurance is the ability to use your muscles for a long time, even after theyget tired. Radial deviation  Stand with a 5 lbs weight or a hammer in your left / right hand. Or, sit while holding a rubber exercise band or tubing, with your left / right forearm supported on a table or countertop. Position your forearm so that the thumb is facing the ceiling, as if you are going to clap your hands. This is the neutral position. Raise your hand upward in front of you so your thumb moves toward the ceiling (radial deviation), or pull up on the rubber tubing. Keep your forearm and elbow still while you move your wrist only. Hold this position for 10 seconds. Slowly return to the starting position. Repeat 10 times. Complete this exercise 1-2 times a day. Wrist extension, eccentric Sit with your left / right forearm palm-down and supported on a table or other surface. Let your left / right wrist extend over the edge of the surface. Hold a 5 lbs (can of soup) weight or a piece of exercise  band or tubing in your left / right hand. If using a rubber exercise band or tubing, hold the other end of the tubing with your other hand. Use your uninjured hand to move your left / right hand up toward the ceiling. Take your uninjured hand away and slowly return to the starting position using only your left / right hand. Lowering your arm under tension is called eccentric extension. Repeat 10 times. Complete this exercise 1-2 times a day. Wrist extension  Do  not do this exercise if it causes pain at the outside of your elbow. Only do this exercise once instructed by your health care provider. Sit with your left / right forearm supported on a table or other surface and your palm turned down toward the floor. Let your left / right wrist extend over the edge of the surface. Hold a 5 lbs weight or a piece of rubber exercise band or tubing. If you are using a rubber exercise band or tubing, hold the band or tubing in place with your other hand to provide resistance. Slowly bend your wrist so your hand moves up toward the ceiling (extension). Move only your wrist, keeping your forearm and elbow still. Hold this position for 10 seconds. Slowly return to the starting position. Repeat 10 times. Complete this exercise 1-2 times a day. Forearm rotation, supination To do this exercise, you will need a lightweight hammer or rubber mallet. Sit with your left / right forearm supported on a table or other surface. Bend your elbow to a 90-degree angle (right angle). Position your forearm so that your palm is facing down toward the floor, with your hand resting over the edge of the table. Hold a hammer in your left / right hand. To make this exercise easier, hold the hammer near the head of the hammer. To make this exercise harder, hold the hammer near the end of the handle. Without moving your wrist or elbow, slowly rotate your forearm so your palm faces up toward the ceiling (supination). Hold this position for 10 seconds. Slowly return to the starting position. Repeat 10 times. Complete this exercise 1-2 times a day. Shoulder blade squeeze Sit in a stable chair or stand with good posture. If you are sitting down, do not let your back touch the back of the chair. Your arms should be at your sides with your elbows bent to a 90-degree angle (right angle). Position your forearms so that your thumbs are facing the ceiling (neutral position). Without lifting your  shoulders up, squeeze your shoulder blades tightly together. Hold this position for 10 seconds. Slowly release and return to the starting position. Repeat 10 times. Complete this exercise 1-2 times a day. This information is not intended to replace advice given to you by your health care provider. Make sure you discuss any questions you have with your healthcare provider. Document Revised: 10/29/2019 Document Reviewed: 10/29/2019 Elsevier Patient Education  [place].

## 2022-07-18 ENCOUNTER — Ambulatory Visit (INDEPENDENT_AMBULATORY_CARE_PROVIDER_SITE_OTHER): Payer: HMO | Admitting: Orthopedic Surgery

## 2022-07-18 ENCOUNTER — Encounter: Payer: Self-pay | Admitting: Orthopedic Surgery

## 2022-07-18 VITALS — Ht 66.0 in | Wt 208.0 lb

## 2022-07-18 DIAGNOSIS — M7711 Lateral epicondylitis, right elbow: Secondary | ICD-10-CM

## 2022-07-18 MED ORDER — METHYLPREDNISOLONE ACETATE 40 MG/ML IJ SUSP
40.0000 mg | Freq: Once | INTRAMUSCULAR | Status: AC
  Administered 2022-07-18: 40 mg via INTRA_ARTICULAR

## 2022-07-18 NOTE — Patient Instructions (Signed)

## 2022-07-18 NOTE — Progress Notes (Signed)
Orthopaedic Clinic Return  Assessment: Nancy [name] is a 56 y.o. female with the following: Right elbow lateral epicondylitis  Plan: Nancy [name] returns to clinic today for persistent pain associated with lateral epicondylitis.  She attempted the exercises, and a brace.  She did not like the brace.  It was causing a rash.  Her pain is severe enough, that she would like an injection.  This was completed in clinic today.  She will follow-up as needed  Procedure note injection - Right lateral elbow   Verbal consent was obtained to inject the right lateral elbow, epicondyle Timeout was completed to confirm the site of injection.  The patient noted the area of most significant pain over the lateral elbow The skin was prepped with alcohol and ethyl chloride was sprayed at the injection site.  A 21-gauge needle was used to inject 40 mg of Depo-Medrol and 1% lidocaine (1 cc) into the space around the lateral epicondyle of the right elbow using a direct lateral approach.  There were no complications.  A sterile bandage was applied.    Follow-up: Return if symptoms worsen or fail to improve.   Subjective:  Chief Complaint  Patient presents with   Elbow Pain    Rt elbow pain no better with the brace and exercises.     History of Present Illness: Nancy [name] is a 56 y.o. female who returns to clinic for evaluation of elbow pain.  I saw her a week ago.  She was diagnosed with lateral epicondylitis.  I recommended a counterforce brace, as well as home exercises.  She has attempted exercises, and has been wearing the brace.  She states her pain is worse.  In addition, the brace is causing a rash.   Review of Systems: No fevers or chills No numbness or tingling No chest pain No shortness of breath No bowel or bladder dysfunction No GI distress No headaches   Objective: Ht '5\' 6"'$  (1.676 m)   Wt 208 lb (94.3 kg)   BMI 33.57 kg/m   Physical Exam:  Alert and oriented.  No acute  distress.  Evaluation of the right elbow demonstrates no bruising.  No swelling.  Small raised areas over the lateral elbow.  No drainage.  No concern for swelling.  She is tenderness to palpation directly over the lateral epicondyle.  Pain is recreated with resisted long finger extension.  She has pain with active extension of her wrist.  Pain is worsened with resisted extension of her wrist.  Fingers are warm and well-perfused.  IMAGING: I personally ordered and reviewed the following images:   No new imaging obtained today.  Nancy Rasmussen, MD 07/18/2022 11:07 PM

## 2022-09-04 DIAGNOSIS — E1140 Type 2 diabetes mellitus with diabetic neuropathy, unspecified: Secondary | ICD-10-CM | POA: Diagnosis not present

## 2022-09-12 DIAGNOSIS — E1140 Type 2 diabetes mellitus with diabetic neuropathy, unspecified: Secondary | ICD-10-CM | POA: Diagnosis not present

## 2022-09-12 DIAGNOSIS — I10 Essential (primary) hypertension: Secondary | ICD-10-CM | POA: Diagnosis not present

## 2022-09-12 DIAGNOSIS — R7309 Other abnormal glucose: Secondary | ICD-10-CM | POA: Diagnosis not present

## 2022-10-19 ENCOUNTER — Encounter: Payer: Self-pay | Admitting: Radiology

## 2022-12-05 DIAGNOSIS — E1140 Type 2 diabetes mellitus with diabetic neuropathy, unspecified: Secondary | ICD-10-CM | POA: Diagnosis not present

## 2022-12-14 DIAGNOSIS — R7309 Other abnormal glucose: Secondary | ICD-10-CM | POA: Diagnosis not present

## 2022-12-14 DIAGNOSIS — I10 Essential (primary) hypertension: Secondary | ICD-10-CM | POA: Diagnosis not present

## 2022-12-14 DIAGNOSIS — E1122 Type 2 diabetes mellitus with diabetic chronic kidney disease: Secondary | ICD-10-CM | POA: Diagnosis not present

## 2022-12-18 ENCOUNTER — Other Ambulatory Visit (HOSPITAL_COMMUNITY): Payer: Self-pay | Admitting: Obstetrics & Gynecology

## 2022-12-18 DIAGNOSIS — Z1231 Encounter for screening mammogram for malignant neoplasm of breast: Secondary | ICD-10-CM

## 2023-02-02 ENCOUNTER — Ambulatory Visit (HOSPITAL_COMMUNITY)
Admission: RE | Admit: 2023-02-02 | Discharge: 2023-02-02 | Disposition: A | Discharge status: Home / Self Care | Payer: HMO | Source: Ambulatory Visit | Attending: Obstetrics & Gynecology | Admitting: Obstetrics & Gynecology

## 2023-02-02 DIAGNOSIS — Z1231 Encounter for screening mammogram for malignant neoplasm of breast: Secondary | ICD-10-CM | POA: Insufficient documentation

## 2023-03-13 DIAGNOSIS — K769 Liver disease, unspecified: Secondary | ICD-10-CM | POA: Diagnosis not present

## 2023-03-13 DIAGNOSIS — E039 Hypothyroidism, unspecified: Secondary | ICD-10-CM | POA: Diagnosis not present

## 2023-03-13 DIAGNOSIS — E1129 Type 2 diabetes mellitus with other diabetic kidney complication: Secondary | ICD-10-CM | POA: Diagnosis not present

## 2023-03-13 DIAGNOSIS — Z79899 Other long term (current) drug therapy: Secondary | ICD-10-CM | POA: Diagnosis not present

## 2023-03-13 DIAGNOSIS — E282 Polycystic ovarian syndrome: Secondary | ICD-10-CM | POA: Diagnosis not present

## 2023-03-13 DIAGNOSIS — I10 Essential (primary) hypertension: Secondary | ICD-10-CM | POA: Diagnosis not present

## 2023-03-13 DIAGNOSIS — E785 Hyperlipidemia, unspecified: Secondary | ICD-10-CM | POA: Diagnosis not present

## 2023-03-20 DIAGNOSIS — E039 Hypothyroidism, unspecified: Secondary | ICD-10-CM | POA: Diagnosis not present

## 2023-03-20 DIAGNOSIS — E1129 Type 2 diabetes mellitus with other diabetic kidney complication: Secondary | ICD-10-CM | POA: Diagnosis not present

## 2023-03-20 DIAGNOSIS — I10 Essential (primary) hypertension: Secondary | ICD-10-CM | POA: Diagnosis not present

## 2023-03-20 DIAGNOSIS — E785 Hyperlipidemia, unspecified: Secondary | ICD-10-CM | POA: Diagnosis not present

## 2023-03-20 DIAGNOSIS — R7309 Other abnormal glucose: Secondary | ICD-10-CM | POA: Diagnosis not present

## 2023-04-14 ENCOUNTER — Encounter: Payer: Self-pay | Admitting: Emergency Medicine

## 2023-04-14 ENCOUNTER — Other Ambulatory Visit: Payer: Self-pay

## 2023-04-14 ENCOUNTER — Ambulatory Visit
Admission: EM | Admit: 2023-04-14 | Discharge: 2023-04-14 | Disposition: A | Discharge status: Home / Self Care | Payer: PPO | Source: Home / Self Care

## 2023-04-14 DIAGNOSIS — J069 Acute upper respiratory infection, unspecified: Secondary | ICD-10-CM

## 2023-04-14 DIAGNOSIS — U071 COVID-19: Secondary | ICD-10-CM | POA: Diagnosis not present

## 2023-04-14 NOTE — Discharge Instructions (Signed)
Your COVID results should be back tomorrow and if positive, someone will discuss if you are interested in antiviral medication.  In the meantime, take over-the-counter cold and congestion medications, Flonase, drink plenty of fluids.  Return for worsening symptoms.

## 2023-04-14 NOTE — ED Triage Notes (Signed)
Pt reports nasal congestion,fatigue, decreased appetite since yesterday. Denies any known fevers.

## 2023-04-14 NOTE — ED Provider Notes (Signed)
[facility]    CSN: [id number] Arrival date & time: 04/14/23  1000      History   Chief Complaint Chief Complaint  Patient presents with   Nasal Congestion    HPI Nancy [name] is a 57 y.o. female.   Patient presenting today with 1 day history of congestion, fatigue, scratchy throat, loss of appetite, low-grade fever.  Denies chest pain, shortness of breath, abdominal pain, vomiting, diarrhea.  So far trying Tylenol with minimal relief.  No known sick contacts recently.    Past Medical History:  Diagnosis Date   Arthritis    Back pain    Complication of anesthesia    Diabetes mellitus    Hypothyroidism    PONV (postoperative nausea and vomiting)    Rosacea    Small bowel obstruction (HCC)    Thyroid disease    Ventral hernia     Patient Active Problem List   Diagnosis Date Noted   Axillary mass, left    Infected zoster 04/29/2013   Hematoma 03/27/2013   Acute postoperative pain of abdomen 12/27/2012   Postop check 10/01/2012   Wound seroma 08/30/2012   Seroma 08/22/2012   Chronic back pain 08/10/2012   Hypothyroidism 08/10/2012   Ventral hernia with obstruction 08/10/2012   Rosacea 08/10/2012   Hypokalemia 08/10/2012   DM 02/15/2009    Past Surgical History:  Procedure Laterality Date   BACK SURGERY     x3; fusion with rods and screws   carpatunnel surgery Bilateral    CARPOMETACARPEL SUSPENSION PLASTY Left 05/23/2018   Procedure: CARPOMETACARPEL (CMC) SUSPENSION PLASTY LEFT THUMB;  Surgeon: Cindee Salt, MD;  Location: [facility];  Service: Orthopedics;  Laterality: Left;   CARPOMETACARPEL SUSPENSION PLASTY Right 10/03/2018   Procedure: RIGHT THUMB SUSPENSONPLASTY WITH MICRO LINK SUTURE AND TRAPEXIUM EXCISION;  Surgeon: Cindee Salt, MD;  Location: [facility];  Service: Orthopedics;  Laterality: Right;  AXILLARY BLOCK   CHOLECYSTECTOMY     COLON SURGERY     COLOSTOMY     ENDOMETRIAL ABLATION     INSERTION OF  MESH  08/03/2012   Procedure: INSERTION OF MESH;  Surgeon: [name], MD;  Location: [facility];  Service: General;  Laterality: N/A;   LAPAROSCOPIC BILATERAL SALPINGO OOPHERECTOMY Bilateral 05/12/2015   Procedure: LAPAROSCOPIC BILATERAL SALPINGO OOPHORECTOMY;  Surgeon: Lazaro Arms, MD;  Location: [facility];  Service: Gynecology;  Laterality: Bilateral;   LAPAROSCOPIC LYSIS OF ADHESIONS Bilateral 05/12/2015   Procedure: EXTENSIVE LAPAROSCOPIC LYSIS OF ADHESIONS;  Surgeon: Lazaro Arms, MD;  Location: [facility];  Service: Gynecology;  Laterality: Bilateral;   LAPAROTOMY  08/03/2012   Procedure: EXPLORATORY LAPAROTOMY;  Surgeon: [name], MD;  Location: [facility];  Service: General;  Laterality: N/A;  with extensive enterolysis   LIVER BIOPSY     MASS EXCISION Left 04/12/2022   Procedure: EXCISION MASS;  Surgeon: Lewie Chamber, DO;  Location: [facility];  Service: General;  Laterality: Left;   REVISION COLOSTOMY     TENDON TRANSFER Left 05/23/2018   Procedure: TENDON TRANSFER WITH TRAPEZIUM EXCISION;  Surgeon: Cindee Salt, MD;  Location: [facility];  Service: Orthopedics;  Laterality: Left;   TRIGGER FINGER RELEASE Left 05/23/2018   Procedure: RELEASE TRIGGER FINGER/A-1 PULLEY LEFT THUMB;  Surgeon: Cindee Salt, MD;  Location: [facility];  Service: Orthopedics;  Laterality: Left;   TRIGGER FINGER RELEASE Right 09/23/2019   Procedure: RIGHT THUMB RELEASE TRIGGER FINGER/A-1 PULLEY;  Surgeon:  Cindee Salt, MD;  Location: [facility];  Service: Orthopedics;  Laterality: Right;  IV REGIONAL FOREARM BLOCK   VENTRAL HERNIA REPAIR  08/03/2012   Procedure: HERNIA REPAIR VENTRAL ADULT;  Surgeon: [name], MD;  Location: [facility];  Service: General;  Laterality: N/A;   VENTRAL HERNIA REPAIR  03/13/2016   Procedure: INCISIONAL HERNIA REPAIR WITH MESH;  Surgeon: Franky Macho, MD;  Location: [facility];  Service: General;;    OB History     Gravida  0   Para  0   Term   0   Preterm  0   AB  0   Living  0      SAB  0   IAB  0   Ectopic  0   Multiple  0   Live Births  0            Home Medications    Prior to Admission medications   Medication Sig Start Date End Date Taking? Authorizing Provider  acetaminophen (TYLENOL) 500 MG tablet Take 1,000 mg by mouth every 6 (six) hours as needed for headache or moderate pain.    [provider]  aspirin EC 81 MG tablet Take 1 tablet (81 mg total) by mouth every evening. 04/13/22   Pappayliou, Santina Evans A, DO  atorvastatin (LIPITOR) 10 MG tablet Take 10 mg by mouth at bedtime.    [provider]  Cholecalciferol 5000 UNITS capsule Take 5,000 Units by mouth every morning. Vitamin D    [provider]  cyclobenzaprine (FLEXERIL) 5 MG tablet Take 5 mg by mouth 2 (two) times daily.    [provider]  dapagliflozin propanediol (FARXIGA) 10 MG TABS tablet Take 10 mg by mouth daily.    [provider]  doxycycline (VIBRA-TABS) 100 MG tablet Take 100 mg by mouth 2 (two) times daily as needed (rosacea).    [provider]  enalapril (VASOTEC) 5 MG tablet Take 5 mg by mouth at bedtime.      [provider]  fluconazole (DIFLUCAN) 150 MG tablet Take one tablet by mouth today. Repeat dose in 3 days. Hold Atorvastatin on days Diflucan is taken. 04/28/22   Lucretia Roers, MD  insulin NPH Human (NOVOLIN N) 100 UNIT/ML injection Inject 55-60 Units into the skin See admin instructions. Inject 60 units in the morning and 55 units at night    [provider]  levothyroxine (SYNTHROID) 137 MCG tablet Take 137 mcg by mouth daily. 02/23/22   [provider]  loperamide (IMODIUM A-D) 2 MG tablet Take 2 mg by mouth 4 (four) times daily as needed for diarrhea or loose stools.    [provider]  metFORMIN (GLUCOPHAGE) 1000 MG tablet Take 1 tablet (1,000 mg total) by mouth 2 (two) times daily. 12/11/12   Riebock, Anette Riedel, NP  metroNIDAZOLE  (METROCREAM) 0.75 % cream Apply 1 Application topically 2 (two) times daily as needed (rosacea).    [provider]  Multiple Vitamin (MULTIVITAMIN) tablet Take 1 tablet by mouth daily.    [provider]  oxyCODONE (ROXICODONE) 5 MG immediate release tablet Take 1 tablet (5 mg total) by mouth every 8 (eight) hours as needed. 04/12/22   Pappayliou, Santina Evans A, DO  Probiotic Product (PROBIOTIC COLON SUPPORT) CAPS Take 1 capsule by mouth daily.    [provider]  Semaglutide, 1 MG/DOSE, (OZEMPIC, 1 MG/DOSE,) 2 MG/1.5ML SOPN Inject 1 mg into the skin every Monday.    [provider]    Family History Family History  Problem Relation Age of Onset   Thyroid disease Mother    Heart disease Father    Diabetes Mellitus I Maternal Grandmother    Diabetes Mellitus I Maternal Grandfather    Diabetes Mellitus I Paternal Grandmother    Diabetes Mellitus I Paternal Grandfather     Social History Social History   Tobacco Use   Smoking status: Never   Smokeless tobacco: Never  Vaping Use   Vaping status: Never Used  Substance Use Topics   Alcohol use: No    Alcohol/week: 0.0 standard drinks of alcohol   Drug use: No     Allergies   Chocolate   Review of Systems Review of Systems Per HPI  Physical Exam Triage Vital Signs ED Triage Vitals  Encounter Vitals Group     BP 04/14/23 1005 125/85     Systolic BP Percentile --      Diastolic BP Percentile --      Pulse Rate 04/14/23 1005 86     Resp 04/14/23 1005 20     Temp 04/14/23 1005 99.5 F (37.5 C)     Temp Source 04/14/23 1005 Oral     SpO2 04/14/23 1005 93 %     Weight --      Height --      Head Circumference --      Peak Flow --      Pain Score 04/14/23 1004 5     Pain Loc --      Pain Education --      Exclude from Growth Chart --    No data found.  Updated Vital Signs BP 125/85 (BP Location: Right Arm)   Pulse 86   Temp 99.5 F (37.5 C) (Oral)   Resp 20   SpO2 93%    Visual Acuity Right Eye Distance:   Left Eye Distance:   Bilateral Distance:    Right Eye Near:   Left Eye Near:    Bilateral Near:     Physical Exam Vitals and nursing note reviewed.  Constitutional:      Appearance: Normal appearance.  HENT:     Head: Atraumatic.     Right Ear: Tympanic membrane and external ear normal.     Left Ear: Tympanic membrane and external ear normal.     Nose: Rhinorrhea present.     Mouth/Throat:     Mouth: Mucous membranes are moist.     Pharynx: Posterior oropharyngeal erythema present.  Eyes:     Extraocular Movements: Extraocular movements intact.     Conjunctiva/sclera: Conjunctivae normal.  Cardiovascular:     Rate and Rhythm: Normal rate and regular rhythm.     Heart sounds: Normal heart sounds.  Pulmonary:     Effort: Pulmonary effort is normal.     Breath sounds: Normal breath sounds. No wheezing.  Musculoskeletal:        General: Normal range of motion.     Cervical back: Normal range of motion and neck supple.  Skin:    General: Skin is warm and dry.  Neurological:     Mental Status: She is alert and oriented to person, place, and time.  Psychiatric:        Mood and Affect: Mood normal.        Thought Content: Thought content normal.      UC Treatments / Results  Labs (all labs ordered are listed, but only abnormal results are displayed) Labs Reviewed  SARS CORONAVIRUS 2 (TAT 6-24 HRS)    EKG   Radiology No results found.  Procedures Procedures (including critical care time)  Medications Ordered in UC Medications - No data to display  Initial Impression / Assessment and Plan / UC Course  I have reviewed the triage vital signs and the nursing notes.  Pertinent labs & imaging results that were available during my care of the patient were reviewed by me and considered in my medical decision making (see chart for details).     Strong suspicion for COVID-19, COVID testing pending, treat with supportive  over-the-counter medications and home care and good candidate for for molnupiravir if positive.  Return for worsening symptoms. Final Clinical Impressions(s) / UC Diagnoses   Final diagnoses:  Viral URI     Discharge Instructions      Your COVID results should be back tomorrow and if positive, someone will discuss if you are interested in antiviral medication.  In the meantime, take over-the-counter cold and congestion medications, Flonase, drink plenty of fluids.  Return for worsening symptoms.    ED Prescriptions   None    PDMP not reviewed this encounter.   Particia Nearing, [place] 04/14/23 1124

## 2023-04-15 ENCOUNTER — Telehealth: Payer: Self-pay

## 2023-04-15 LAB — SARS CORONAVIRUS 2 (TAT 6-24 HRS): SARS Coronavirus 2: POSITIVE — AB

## 2023-04-15 MED ORDER — MOLNUPIRAVIR EUA 200MG CAPSULE: 4.0000 | ORAL_CAPSULE | Freq: Two times a day (BID) | ORAL | 0 refills | Status: AC

## 2023-04-15 NOTE — Telephone Encounter (Signed)
Pt called after receiving her covid positive results. Pt wanted the antiviral medication that the provider spoke to her about x 1 day. After discussing with the provider she said I could sent the [facility] in for the patient. I called the patient and discussed the medication with her and to let her know not many places have that med in [place]. If she has problems with the script please contact us.   Script sent in to [facility] in [place].

## 2023-04-25 ENCOUNTER — Ambulatory Visit
Admission: EM | Admit: 2023-04-25 | Discharge: 2023-04-25 | Disposition: A | Discharge status: Home / Self Care | Payer: PPO | Attending: Nurse Practitioner | Admitting: Nurse Practitioner

## 2023-04-25 DIAGNOSIS — U071 COVID-19: Secondary | ICD-10-CM | POA: Diagnosis not present

## 2023-04-25 MED ORDER — FLUTICASONE PROPIONATE 50 MCG/ACT NA SUSP: 2.0000 | Freq: Every day | NASAL | 0 refills | Status: AC

## 2023-04-25 MED ORDER — PSEUDOEPH-BROMPHEN-DM 30-2-10 MG/5ML PO SYRP: 5.0000 mL | ORAL_SOLUTION | Freq: Four times a day (QID) | ORAL | 0 refills | Status: DC | PRN

## 2023-04-25 NOTE — ED Triage Notes (Signed)
Pt c/o headache nasal congestion and cough x 1 day was seen on 04/15/2023. Pt tested positive for COVID, was given antiviral. States that she still has an awful headache, nasal congestion, cough,drainage.

## 2023-04-25 NOTE — ED Provider Notes (Signed)
[facility]    CSN: [id number] Arrival date & time: 04/25/23  8657      History   Chief Complaint No chief complaint on file.   HPI Nancy [name] is a 57 y.o. female.   The history is provided by the patient.   Patient presents for complaints of headache, runny nose, and cough.  Patient was seen in this clinic on 04/14/2023 and had a positive COVID test.  She was started on molnupiravir on 04/15/2023.  She completed the medication.  Patient states she initially began to feel better, but over the past 24 hours, she has had residual symptoms.  She denies fever, chills, ear pain, ear drainage, sore throat, wheezing, shortness of breath, difficulty breathing, chest pain, abdominal pain, nausea, vomiting, or diarrhea.  Patient reports she has been using normal saline nasal spray and using cough drops for her symptoms.  States that she took Tylenol this morning when she woke up due to the headache, which is since improved.  Past Medical History:  Diagnosis Date   Arthritis    Back pain    Complication of anesthesia    Diabetes mellitus    Hypothyroidism    PONV (postoperative nausea and vomiting)    Rosacea    Small bowel obstruction (HCC)    Thyroid disease    Ventral hernia     Patient Active Problem List   Diagnosis Date Noted   Axillary mass, left    Infected zoster 04/29/2013   Hematoma 03/27/2013   Acute postoperative pain of abdomen 12/27/2012   Postop check 10/01/2012   Wound seroma 08/30/2012   Seroma 08/22/2012   Chronic back pain 08/10/2012   Hypothyroidism 08/10/2012   Ventral hernia with obstruction 08/10/2012   Rosacea 08/10/2012   Hypokalemia 08/10/2012   DM 02/15/2009    Past Surgical History:  Procedure Laterality Date   BACK SURGERY     x3; fusion with rods and screws   carpatunnel surgery Bilateral    CARPOMETACARPEL SUSPENSION PLASTY Left 05/23/2018   Procedure: CARPOMETACARPEL (CMC) SUSPENSION PLASTY LEFT THUMB;  Surgeon: Cindee Salt,  MD;  Location: [facility];  Service: Orthopedics;  Laterality: Left;   CARPOMETACARPEL SUSPENSION PLASTY Right 10/03/2018   Procedure: RIGHT THUMB SUSPENSONPLASTY WITH MICRO LINK SUTURE AND TRAPEXIUM EXCISION;  Surgeon: Cindee Salt, MD;  Location: [facility];  Service: Orthopedics;  Laterality: Right;  AXILLARY BLOCK   CHOLECYSTECTOMY     COLON SURGERY     COLOSTOMY     ENDOMETRIAL ABLATION     INSERTION OF MESH  08/03/2012   Procedure: INSERTION OF MESH;  Surgeon: [name], MD;  Location: [facility];  Service: General;  Laterality: N/A;   LAPAROSCOPIC BILATERAL SALPINGO OOPHERECTOMY Bilateral 05/12/2015   Procedure: LAPAROSCOPIC BILATERAL SALPINGO OOPHORECTOMY;  Surgeon: Lazaro Arms, MD;  Location: [facility];  Service: Gynecology;  Laterality: Bilateral;   LAPAROSCOPIC LYSIS OF ADHESIONS Bilateral 05/12/2015   Procedure: EXTENSIVE LAPAROSCOPIC LYSIS OF ADHESIONS;  Surgeon: Lazaro Arms, MD;  Location: [facility];  Service: Gynecology;  Laterality: Bilateral;   LAPAROTOMY  08/03/2012   Procedure: EXPLORATORY LAPAROTOMY;  Surgeon: [name], MD;  Location: [facility];  Service: General;  Laterality: N/A;  with extensive enterolysis   LIVER BIOPSY     MASS EXCISION Left 04/12/2022   Procedure: EXCISION MASS;  Surgeon: Lewie Chamber, DO;  Location: [facility];  Service: General;  Laterality: Left;   REVISION COLOSTOMY  TENDON TRANSFER Left 05/23/2018   Procedure: TENDON TRANSFER WITH TRAPEZIUM EXCISION;  Surgeon: Cindee Salt, MD;  [facility];  Service: Orthopedics;  Laterality: Left;   TRIGGER FINGER RELEASE Left 05/23/2018   Procedure: RELEASE TRIGGER FINGER/A-1 PULLEY LEFT THUMB;  Surgeon: Cindee Salt, MD;  Location: [facility];  Service: Orthopedics;  Laterality: Left;   TRIGGER FINGER RELEASE Right 09/23/2019   Procedure: RIGHT THUMB RELEASE TRIGGER FINGER/A-1 PULLEY;  Surgeon: Cindee Salt, MD;  Location: [facility];  Service: Orthopedics;  Laterality: Right;  IV REGIONAL FOREARM BLOCK   VENTRAL HERNIA REPAIR  08/03/2012   Procedure: HERNIA REPAIR VENTRAL ADULT;  Surgeon: [name], MD;  Location: [facility];  Service: General;  Laterality: N/A;   VENTRAL HERNIA REPAIR  03/13/2016   Procedure: INCISIONAL HERNIA REPAIR WITH MESH;  Surgeon: Franky Macho, MD;  Location: [facility];  Service: General;;    OB History     Gravida  0   Para  0   Term  0   Preterm  0   AB  0   Living  0      SAB  0   IAB  0   Ectopic  0   Multiple  0   Live Births  0            Home Medications    Prior to Admission medications   Medication Sig Start Date End Date Taking? Authorizing Provider  brompheniramine-pseudoephedrine-DM 30-2-10 MG/5ML syrup Take 5 mLs by mouth 4 (four) times daily as needed. 04/25/23  Yes Fernie Grimm-Warren, Sadie Haber, NP  fluticasone (FLONASE) 50 MCG/ACT nasal spray Place 2 sprays into both nostrils daily. 04/25/23  Yes Vicent Febles-Warren, Sadie Haber, NP  acetaminophen (TYLENOL) 500 MG tablet Take 1,000 mg by mouth every 6 (six) hours as needed for headache or moderate pain.    [provider]  aspirin EC 81 MG tablet Take 1 tablet (81 mg total) by mouth every evening. 04/13/22   Pappayliou, Santina Evans A, DO  atorvastatin (LIPITOR) 10 MG tablet Take 10 mg by mouth at bedtime.    [provider]  Cholecalciferol 5000 UNITS capsule Take 5,000 Units by mouth every morning. Vitamin D    [provider]  cyclobenzaprine (FLEXERIL) 5 MG tablet Take 5 mg by mouth 2 (two) times daily.    [provider]  dapagliflozin propanediol (FARXIGA) 10 MG TABS tablet Take 10 mg by mouth daily.    [provider]  doxycycline (VIBRA-TABS) 100 MG tablet Take 100 mg by mouth 2 (two) times daily as needed (rosacea).    [provider]  enalapril (VASOTEC) 5 MG tablet Take 5 mg by mouth at bedtime.      [provider]  fluconazole (DIFLUCAN) 150 MG  tablet Take one tablet by mouth today. Repeat dose in 3 days. Hold Atorvastatin on days Diflucan is taken. 04/28/22   Lucretia Roers, MD  insulin NPH Human (NOVOLIN N) 100 UNIT/ML injection Inject 55-60 Units into the skin See admin instructions. Inject 60 units in the morning and 55 units at night    [provider]  levothyroxine (SYNTHROID) 137 MCG tablet Take 137 mcg by mouth daily. 02/23/22   [provider]  loperamide (IMODIUM A-D) 2 MG tablet Take 2 mg by mouth 4 (four) times daily as needed for diarrhea or loose stools.    [provider]  metFORMIN (GLUCOPHAGE) 1000 MG tablet Take 1 tablet (1,000 mg  total) by mouth 2 (two) times daily. 12/11/12   Riebock, Anette Riedel, NP  metroNIDAZOLE (METROCREAM) 0.75 % cream Apply 1 Application topically 2 (two) times daily as needed (rosacea).    [provider]  Multiple Vitamin (MULTIVITAMIN) tablet Take 1 tablet by mouth daily.    [provider]  oxyCODONE (ROXICODONE) 5 MG immediate release tablet Take 1 tablet (5 mg total) by mouth every 8 (eight) hours as needed. 04/12/22   Pappayliou, Santina Evans A, DO  Probiotic Product (PROBIOTIC COLON SUPPORT) CAPS Take 1 capsule by mouth daily.    [provider]  Semaglutide, 1 MG/DOSE, (OZEMPIC, 1 MG/DOSE,) 2 MG/1.5ML SOPN Inject 1 mg into the skin every Monday.    [provider]    Family History Family History  Problem Relation Age of Onset   Thyroid disease Mother    Heart disease Father    Diabetes Mellitus I Maternal Grandmother    Diabetes Mellitus I Maternal Grandfather    Diabetes Mellitus I Paternal Grandmother    Diabetes Mellitus I Paternal Grandfather     Social History Social History   Tobacco Use   Smoking status: Never   Smokeless tobacco: Never  Vaping Use   Vaping status: Never Used  Substance Use Topics   Alcohol use: No    Alcohol/week: 0.0 standard drinks of alcohol   Drug use: No     Allergies   Chocolate and  Gabapentin   Review of Systems Review of Systems Per HPI  Physical Exam Triage Vital Signs ED Triage Vitals  Encounter Vitals Group     BP 04/25/23 1048 129/88     Systolic BP Percentile --      Diastolic BP Percentile --      Pulse Rate 04/25/23 1048 83     Resp 04/25/23 1048 15     Temp 04/25/23 1048 98.1 F (36.7 C)     Temp Source 04/25/23 1048 Oral     SpO2 04/25/23 1048 95 %     Weight --      Height --      Head Circumference --      Peak Flow --      Pain Score 04/25/23 1055 0     Pain Loc --      Pain Education --      Exclude from Growth Chart --    No data found.  Updated Vital Signs BP 129/88 (BP Location: Right Arm)   Pulse 83   Temp 98.1 F (36.7 C) (Oral)   Resp 15   SpO2 95%   Visual Acuity Right Eye Distance:   Left Eye Distance:   Bilateral Distance:    Right Eye Near:   Left Eye Near:    Bilateral Near:     Physical Exam Vitals and nursing note reviewed.  Constitutional:      General: She is not in acute distress.    Appearance: Normal appearance.  HENT:     Head: Normocephalic.     Right Ear: Tympanic membrane, ear canal and external ear normal.     Left Ear: Tympanic membrane, ear canal and external ear normal.     Nose: Rhinorrhea present.     Right Turbinates: Enlarged and swollen.     Left Turbinates: Enlarged and swollen.     Right Sinus: Maxillary sinus tenderness present. No frontal sinus tenderness.     Left Sinus: Maxillary sinus tenderness present. No frontal sinus tenderness.     Mouth/Throat:  Lips: Pink.     Mouth: Mucous membranes are moist.     Pharynx: Oropharynx is clear. Uvula midline. Postnasal drip present. No pharyngeal swelling, oropharyngeal exudate, posterior oropharyngeal erythema or uvula swelling.  Eyes:     Extraocular Movements: Extraocular movements intact.     Conjunctiva/sclera: Conjunctivae normal.     Pupils: Pupils are equal, round, and reactive to light.  Cardiovascular:     Rate and  Rhythm: Normal rate and regular rhythm.     Pulses: Normal pulses.     Heart sounds: Normal heart sounds.  Pulmonary:     Effort: Pulmonary effort is normal. No respiratory distress.     Breath sounds: Normal breath sounds. No stridor. No wheezing, rhonchi or rales.  Abdominal:     General: Bowel sounds are normal.     Palpations: Abdomen is soft.     Tenderness: There is no abdominal tenderness.  Musculoskeletal:     Cervical back: Normal range of motion.  Lymphadenopathy:     Cervical: No cervical adenopathy.  Skin:    General: Skin is warm and dry.  Neurological:     Mental Status: She is alert and oriented to person, place, and time.  Psychiatric:        Mood and Affect: Mood normal.        Behavior: Behavior normal.      UC Treatments / Results  Labs (all labs ordered are listed, but only abnormal results are displayed) Labs Reviewed - No data to display  EKG   Radiology No results found.  Procedures Procedures (including critical care time)  Medications Ordered in UC Medications - No data to display  Initial Impression / Assessment and Plan / UC Course  I have reviewed the triage vital signs and the nursing notes.  Pertinent labs & imaging results that were available during my care of the patient were reviewed by me and considered in my medical decision making (see chart for details).  The patient is well-appearing, she is in no acute distress, vital signs are stable.  Recent diagnosis of COVID with residual symptoms.  Continue to fill that symptoms are related to her COVID diagnosis.  Will provide symptomatic treatment with fluticasone 50 micro nasal spray and Bromfed-DM for her cough.  Supportive care recommendations were provided and discussed with the patient to include continuing use of normal saline nasal spray, over-the-counter Tylenol or ibuprofen for pain or discomfort, and use of a humidifier in her bedroom at nighttime during sleep.  Patient was given  indications of when follow-up may be necessary.  Patient was in agreement with this plan of care and verbalizes understanding.  All questions were answered.  Patient stable for discharge.  Final Clinical Impressions(s) / UC Diagnoses   Final diagnoses:  COVID     Discharge Instructions      Take medication as prescribed. Increase fluids and allow for plenty of rest. Continue over-the-counter Tylenol or ibuprofen as needed for pain, fever, general discomfort. Continue normal saline nasal spray throughout the day to help with nasal congestion and runny nose. For your cough, it may be helpful to sleep elevated on pillows while cough symptoms persist and using a humidifier in your bedroom at nighttime during sleep. As discussed, if symptoms are not improving over the next 5 to 7 days, or if they suddenly worsen, please follow-up in this clinic or with your primary care physician for further evaluation. Follow-up as needed.     ED Prescriptions  Medication Sig Dispense Auth. Provider   fluticasone (FLONASE) 50 MCG/ACT nasal spray Place 2 sprays into both nostrils daily. 16 g Rodrecus Belsky-Warren, Sadie Haber, NP   brompheniramine-pseudoephedrine-DM 30-2-10 MG/5ML syrup Take 5 mLs by mouth 4 (four) times daily as needed. 140 mL Britanni Yarde-Warren, Sadie Haber, NP      PDMP not reviewed this encounter.   Abran Cantor, NP 04/25/23 1122

## 2023-04-25 NOTE — Discharge Instructions (Addendum)
Take medication as prescribed. Increase fluids and allow for plenty of rest. Continue over-the-counter Tylenol or ibuprofen as needed for pain, fever, general discomfort. Continue normal saline nasal spray throughout the day to help with nasal congestion and runny nose. For your cough, it may be helpful to sleep elevated on pillows while cough symptoms persist and using a humidifier in your bedroom at nighttime during sleep. As discussed, if symptoms are not improving over the next 5 to 7 days, or if they suddenly worsen, please follow-up in this clinic or with your primary care physician for further evaluation. Follow-up as needed.

## 2023-05-17 DIAGNOSIS — Z23 Encounter for immunization: Secondary | ICD-10-CM | POA: Diagnosis not present

## 2023-05-21 DIAGNOSIS — E1129 Type 2 diabetes mellitus with other diabetic kidney complication: Secondary | ICD-10-CM | POA: Diagnosis not present

## 2023-05-22 ENCOUNTER — Telehealth: Payer: Self-pay | Admitting: Pharmacist

## 2023-05-22 NOTE — Progress Notes (Signed)
05/22/2023  Patient ID: Nancy [name], female   DOB: 1966-07-18, 57 y.o.   MRN: [id number]   Referral source: [facility] Patient Assistance Report List Referral medication(s): Marcelline Deist and Ozempic Current insurance:HTA   Objective: Allergies  Allergen Reactions   Chocolate Other (See Comments)    rosacea    Gabapentin Nausea And Vomiting and Rash    Medications Reviewed Today     Reviewed by Beecher Mcardle, [facility] (Pharmacist) on 05/22/23 at 1236  Med List Status: <None>   Medication Order Taking? Sig Documenting Provider Last Dose Status Informant  acetaminophen (TYLENOL) 500 MG tablet [id number] Yes Take 1,000 mg by mouth every 6 (six) hours as needed for headache or moderate pain. [provider] Taking Active Self  aspirin EC 81 MG tablet [id number] Yes Take 1 tablet (81 mg total) by mouth every evening. Pappayliou, Gustavus Messing, DO Taking Active   atorvastatin (LIPITOR) 10 MG tablet [id number] Yes Take 10 mg by mouth at bedtime. [provider] Taking Active Self  Cholecalciferol 5000 UNITS capsule [id number] Yes Take 5,000 Units by mouth every morning. Vitamin D [provider] Taking Active Self  cyclobenzaprine (FLEXERIL) 5 MG tablet [id number] Yes Take 5 mg by mouth 2 (two) times daily. [provider] Taking Active Self  dapagliflozin propanediol (FARXIGA) 10 MG TABS tablet [id number] Yes Take 10 mg by mouth daily. [provider] Taking Active Self  doxycycline (VIBRA-TABS) 100 MG tablet [id number] Yes Take 100 mg by mouth 2 (two) times daily as needed (rosacea). [provider] Taking Active Self  enalapril (VASOTEC) 5 MG tablet [id number] Yes Take 5 mg by mouth at bedtime.   [provider] Taking Active Self  fluticasone (FLONASE) 50 MCG/ACT nasal spray [id number] Yes Place 2 sprays into both nostrils daily. Leath-Warren, Sadie Haber, NP Taking Active   insulin NPH Human (NOVOLIN N) 100 UNIT/ML injection [id number] Yes Inject  55-60 Units into the skin See admin instructions. Inject 60 units in the morning and 55 units at night [provider] Taking Active Self  levothyroxine (SYNTHROID) 137 MCG tablet [id number] Yes Take 137 mcg by mouth daily. [provider] Taking Active Self  loperamide (IMODIUM A-D) 2 MG tablet [id number] No Take 2 mg by mouth 4 (four) times daily as needed for diarrhea or loose stools. [provider] Unknown Active Self  metFORMIN (GLUCOPHAGE) 1000 MG tablet [id number] Yes Take 1 tablet (1,000 mg total) by mouth 2 (two) times daily. Ashok Norris, NP Taking Active Self  metroNIDAZOLE (METROCREAM) 0.75 % cream [id number] Yes Apply 1 Application topically 2 (two) times daily as needed (rosacea). [provider] Taking Active Self  Multiple Vitamin (MULTIVITAMIN) tablet [id number] Yes Take 1 tablet by mouth daily. Centrum Silver Adult 50+ [provider] Taking Active Self  Probiotic Product (PROBIOTIC COLON SUPPORT) CAPS [id number] Yes Take 1 capsule by mouth daily. [provider] Taking Active Self  Semaglutide, 1 MG/DOSE, (OZEMPIC, 1 MG/DOSE,) 2 MG/1.5ML SOPN [id number] Yes Inject 1 mg into the skin every Monday. [provider] Taking Active Self            Assessment:  Medication Assistance Findings:  Medication assistance needs identified: Spoke with Patient. HIPAA identifiers were obtained.  Patient confirmed she received [place] and Ozempic through Patient Assistance Programs last year and she will continue to need assistance for 2025.  She appears to still qualify and will have forms sent to her home for re-enrollment.  [place] have changed their process for 2025 but  the patient said she has not received any information form them.  Her medications were reviewed. HgA1c- from 01/24-6.6%.    Additional medication assistance options reviewed with patient as warranted:  No other options identified  Plan: I will route patient  assistance letter to [facility] pharmacy technician who will coordinate patient assistance program application process for medications listed above.  [facility] pharmacy technician will assist with obtaining all required documents from both patient and provider(s) and submit application(s) once completed.      Beecher Mcardle, PharmD, BCACP [facility] Clinical Pharmacist [phone]

## 2023-05-28 DIAGNOSIS — I10 Essential (primary) hypertension: Secondary | ICD-10-CM | POA: Diagnosis not present

## 2023-05-28 DIAGNOSIS — R040 Epistaxis: Secondary | ICD-10-CM | POA: Diagnosis not present

## 2023-05-28 DIAGNOSIS — E1122 Type 2 diabetes mellitus with diabetic chronic kidney disease: Secondary | ICD-10-CM | POA: Diagnosis not present

## 2023-06-01 ENCOUNTER — Telehealth: Payer: Self-pay | Admitting: Pharmacy Technician

## 2023-06-01 DIAGNOSIS — Z5986 Financial insecurity: Secondary | ICD-10-CM

## 2023-06-01 NOTE — Progress Notes (Signed)
[name])                                            [facility] Quality Pharmacy Team    06/01/2023  Nancy [name] November 24, 1965 [id number]                                      Medication Assistance Referral     Referral From: [facility] RPh Nancy B.  Medication/Company: Franki Monte / [employer] Patient application portion:  Mining engineer portion: Faxed  to Nancy [name] Provider address/fax verified via: Office website  Medication/Company: Marcelline Deist / [employer] Patient application portion:  Mailed Provider application portion: Faxed  to Nancy [name] Provider address/fax verified via: Office website  Nancy [name], CPhT [place]  Office: [phone] Fax: [phone] Email: [name]

## 2023-06-21 ENCOUNTER — Telehealth: Payer: Self-pay | Admitting: Pharmacy Technician

## 2023-06-21 DIAGNOSIS — Z5986 Financial insecurity: Secondary | ICD-10-CM

## 2023-06-21 NOTE — Progress Notes (Signed)
[facility])                                            [facility] Quality Pharmacy Team    06/21/2023  Nancy [name] [birth year] [id number]  Received patient and provider portion(s) of patient assistance application(s) for Farxiga and Ozempic. Faxed completed application and required documents into [employer] and Novo Nordisk respectively.  Nancy [name], CPhT [place]  Office: [phone] Fax: [phone] Email: [name]

## 2023-06-25 ENCOUNTER — Telehealth: Payer: Self-pay | Admitting: Pharmacy Technician

## 2023-06-25 DIAGNOSIS — Z5986 Financial insecurity: Secondary | ICD-10-CM

## 2023-06-25 NOTE — Progress Notes (Addendum)
[facility])                                       -     [facility] Quality Pharmacy Team    06/25/2023  Nancy [name] 06/13/1966 [id number]  Two care coordination calls placed in regard to 2025 re enrollment applications with [employer] for [employer] with [employer].  First call placed to [employer]. Patient is APPROVED 08/22/23-08/20/24. Medication will auto refill and process with delivery to the patient's home. Patient may call [employer] to check on refill history, status and delivery by calling [phone] at anytime. Patient will receive a letter in the mail about her approval.  Second call placed to [employer]. Spoke to [place] who informs patient is APPROVED  from  08/22/23-08/16/24. She informs a 4 month supply shipment was delivered on 05/17/23 at the provider's office and does not currently have an order processing. She informs when ready the orders will auto process and ship to the provider's office. Patient may call Novo Nordisk to check on refill history, status and delivery by calling [phone] at any time. Patient will receive a letter in the mail about her approval. The patient's id number with [employer] is [id number].  Pattricia Boss, CPhT [place]  Office: [phone] Fax: [phone] Email: [email or web address]

## 2023-06-26 ENCOUNTER — Ambulatory Visit (INDEPENDENT_AMBULATORY_CARE_PROVIDER_SITE_OTHER): Payer: PPO | Admitting: Otolaryngology

## 2023-06-26 ENCOUNTER — Encounter (INDEPENDENT_AMBULATORY_CARE_PROVIDER_SITE_OTHER): Payer: Self-pay

## 2023-06-26 VITALS — Ht 66.0 in | Wt 212.0 lb

## 2023-06-26 DIAGNOSIS — R040 Epistaxis: Secondary | ICD-10-CM

## 2023-06-28 DIAGNOSIS — R040 Epistaxis: Secondary | ICD-10-CM | POA: Insufficient documentation

## 2023-06-28 NOTE — Progress Notes (Signed)
Patient ID: Nancy [name], female   DOB: 1966/07/03, 57 y.o.   MRN: [id number]  Cc: Recurrent left epistaxis  HPI:  Nancy [name] is a 57 y.o. female who presents today complaining of recurrent left epistaxis for the past month.  Over the past few days, she has been experiencing daily bleeding.  The patient started using Flonase nasal spray in September to treat her nasal congestion.  She started experiencing frequent left-sided bleeding in October.  She was also noted to have a possible nasal sore within the left nostril.  She was treated with mupirocin ointment.  The patient has no previous ENT surgery.  She currently takes a baby aspirin each day.  Past Medical History:  Diagnosis Date   Arthritis    Back pain    Complication of anesthesia    Diabetes mellitus    Hypothyroidism    PONV (postoperative nausea and vomiting)    Rosacea    Small bowel obstruction (HCC)    Thyroid disease    Ventral hernia     Past Surgical History:  Procedure Laterality Date   BACK SURGERY     x3; fusion with rods and screws   carpatunnel surgery Bilateral    CARPOMETACARPEL SUSPENSION PLASTY Left 05/23/2018   Procedure: CARPOMETACARPEL (CMC) SUSPENSION PLASTY LEFT THUMB;  Surgeon: Cindee Salt, MD;  Location: [facility];  Service: Orthopedics;  Laterality: Left;   CARPOMETACARPEL SUSPENSION PLASTY Right 10/03/2018   Procedure: RIGHT THUMB SUSPENSONPLASTY WITH MICRO LINK SUTURE AND TRAPEXIUM EXCISION;  Surgeon: Cindee Salt, MD;  Location: [facility];  Service: Orthopedics;  Laterality: Right;  AXILLARY BLOCK   CHOLECYSTECTOMY     COLON SURGERY     COLOSTOMY     ENDOMETRIAL ABLATION     INSERTION OF MESH  08/03/2012   Procedure: INSERTION OF MESH;  Surgeon: [name], MD;  Location: [facility] OR;  Service: General;  Laterality: N/A;   LAPAROSCOPIC BILATERAL SALPINGO OOPHERECTOMY Bilateral 05/12/2015   Procedure: LAPAROSCOPIC BILATERAL SALPINGO OOPHORECTOMY;  Surgeon: Lazaro Arms, MD;  Location: [facility];  Service: Gynecology;  Laterality: Bilateral;   LAPAROSCOPIC LYSIS OF ADHESIONS Bilateral 05/12/2015   Procedure: EXTENSIVE LAPAROSCOPIC LYSIS OF ADHESIONS;  Surgeon: Lazaro Arms, MD;  Location: [facility];  Service: Gynecology;  Laterality: Bilateral;   LAPAROTOMY  08/03/2012   Procedure: EXPLORATORY LAPAROTOMY;  Surgeon: [name], MD;  Location: [facility] OR;  Service: General;  Laterality: N/A;  with extensive enterolysis   LIVER BIOPSY     MASS EXCISION Left 04/12/2022   Procedure: EXCISION MASS;  Surgeon: Lewie Chamber, DO;  Location: [facility];  Service: General;  Laterality: Left;   REVISION COLOSTOMY     TENDON TRANSFER Left 05/23/2018   Procedure: TENDON TRANSFER WITH TRAPEZIUM EXCISION;  Surgeon: Cindee Salt, MD;  Location: [facility];  Service: Orthopedics;  Laterality: Left;   TRIGGER FINGER RELEASE Left 05/23/2018   Procedure: RELEASE TRIGGER FINGER/A-1 PULLEY LEFT THUMB;  Surgeon: Cindee Salt, MD;  Location: [facility];  Service: Orthopedics;  Laterality: Left;   TRIGGER FINGER RELEASE Right 09/23/2019   Procedure: RIGHT THUMB RELEASE TRIGGER FINGER/A-1 PULLEY;  Surgeon: Cindee Salt, MD;  Location: [facility];  Service: Orthopedics;  Laterality: Right;  IV REGIONAL FOREARM BLOCK   VENTRAL HERNIA REPAIR  08/03/2012   Procedure: HERNIA REPAIR VENTRAL ADULT;  Surgeon: [name], MD;  Location: [facility] OR;  Service: General;  Laterality: N/A;  VENTRAL HERNIA REPAIR  03/13/2016   Procedure: INCISIONAL HERNIA REPAIR WITH MESH;  Surgeon: Franky Macho, MD;  Location: [facility];  Service: General;;    Family History  Problem Relation Age of Onset   Thyroid disease Mother    Heart disease Father    Diabetes Mellitus I Maternal Grandmother    Diabetes Mellitus I Maternal Grandfather    Diabetes Mellitus I Paternal Grandmother    Diabetes Mellitus I Paternal Grandfather     Social History:  reports that she has never  smoked. She has never used smokeless tobacco. She reports that she does not drink alcohol and does not use drugs.  Allergies:  Allergies  Allergen Reactions   Chocolate Other (See Comments)    rosacea    Gabapentin Nausea And Vomiting and Rash    Prior to Admission medications   Medication Sig Start Date End Date Taking? Authorizing Provider  acetaminophen (TYLENOL) 500 MG tablet Take 1,000 mg by mouth every 6 (six) hours as needed for headache or moderate pain.   Yes [provider]  aspirin EC 81 MG tablet Take 1 tablet (81 mg total) by mouth every evening. 04/13/22  Yes Pappayliou, Santina Evans A, DO  atorvastatin (LIPITOR) 10 MG tablet Take 10 mg by mouth at bedtime.   Yes [provider]  Cholecalciferol 5000 UNITS capsule Take 5,000 Units by mouth every morning. Vitamin D   Yes [provider]  cyclobenzaprine (FLEXERIL) 5 MG tablet Take 5 mg by mouth 2 (two) times daily.   Yes [provider]  dapagliflozin propanediol (FARXIGA) 10 MG TABS tablet Take 10 mg by mouth daily.   Yes [provider]  doxycycline (VIBRA-TABS) 100 MG tablet Take 100 mg by mouth 2 (two) times daily as needed (rosacea).   Yes [provider]  enalapril (VASOTEC) 5 MG tablet Take 5 mg by mouth at bedtime.     Yes [provider]  fluticasone (FLONASE) 50 MCG/ACT nasal spray Place 2 sprays into both nostrils daily. 04/25/23  Yes Leath-Warren, Sadie Haber, NP  insulin NPH Human (NOVOLIN N) 100 UNIT/ML injection Inject 55-60 Units into the skin See admin instructions. Inject 60 units in the morning and 55 units at night   Yes [provider]  levothyroxine (SYNTHROID) 137 MCG tablet Take 137 mcg by mouth daily. 02/23/22  Yes [provider]  loperamide (IMODIUM A-D) 2 MG tablet Take 2 mg by mouth 4 (four) times daily as needed for diarrhea or loose stools.   Yes [provider]  metFORMIN (GLUCOPHAGE) 1000 MG tablet Take 1 tablet  (1,000 mg total) by mouth 2 (two) times daily. 12/11/12  Yes Riebock, Emina, NP  metroNIDAZOLE (METROCREAM) 0.75 % cream Apply 1 Application topically 2 (two) times daily as needed (rosacea).   Yes [provider]  Multiple Vitamin (MULTIVITAMIN) tablet Take 1 tablet by mouth daily. Centrum Silver Adult 50+   Yes [provider]  Probiotic Product (PROBIOTIC COLON SUPPORT) CAPS Take 1 capsule by mouth daily.   Yes [provider]  Semaglutide, 1 MG/DOSE, (OZEMPIC, 1 MG/DOSE,) 2 MG/1.5ML SOPN Inject 1 mg into the skin every Monday.   Yes [provider]   Height 5\' 6"  (1.676 m), weight 212 lb (96.2 kg). Exam: General: Communicates without difficulty, well nourished, no acute distress. Head: Normocephalic, no evidence injury, no tenderness, facial buttresses intact without stepoff. Face/sinus: No tenderness to palpation and percussion. Facial movement is normal and symmetric. Eyes: PERRL, EOMI. No scleral icterus,  conjunctivae clear. Neuro: CN II exam reveals vision grossly intact.  No nystagmus at any point of gaze. Ears: Auricles well formed without lesions.  Ear canals are intact without mass or lesion.  No erythema or edema is appreciated.  The TMs are intact without fluid. Nose: External evaluation reveals normal support and skin without lesions.  Dorsum is intact.  Anterior rhinoscopy reveals hypervascular areas on the left nasal septum.  Oral:  Oral cavity and oropharynx are intact, symmetric, without erythema or edema.  Mucosa is moist without lesions. Neck: Full range of motion without pain.  There is no significant lymphadenopathy.  No masses palpable.  Thyroid bed within normal limits to palpation.  Parotid glands and submandibular glands equal bilaterally without mass.  Trachea is midline. Neuro:  CN 2-12 grossly intact.    Procedure:  Endoscopic control of recurrent left epistaxis. Indication:  Recurrent epistaxis  Description:  The left nasal cavity is  sprayed with topical xylocaine and neo-synephrine.  After adequate anesthesia is achieved, the nasal cavity is examined with a 0 rigid endoscope.  A suction catheter is inserted into parallel with the 0 endoscope, and it is used to suction blood clots from the nasal cavity.  Several hypervascular areas are noted on the anterior and superior portion of the septum. Active bleeding is noted. A silver nitrate stick is inserted in parallel with the 0 endoscope.  It is used to repeatedly cauterized the hypervascular areas.  Good hemostasis is achieved.  The patient tolerated the procedure well.    Assessment: 1.  Several hypervascular areas are noted on the left anterior and superior nasal septum. 2.  No suspicious mass or lesion is noted today.  Plan: 1.  The physical exam and nasal endoscopy findings are reviewed with the patient. 2.  Endoscopic cauterization of the left anterior and superior nasal septum. 3.  The patient is instructed to discontinue the use of Flonase. 4.  The patient will return for reevaluation in 4 weeks, sooner if needed.  Trevione Wert W Lewayne Pauley 06/28/2023, 8:22 AM

## 2023-07-03 DIAGNOSIS — E1129 Type 2 diabetes mellitus with other diabetic kidney complication: Secondary | ICD-10-CM | POA: Diagnosis not present

## 2023-07-30 ENCOUNTER — Encounter (INDEPENDENT_AMBULATORY_CARE_PROVIDER_SITE_OTHER): Payer: Self-pay

## 2023-07-30 ENCOUNTER — Ambulatory Visit (INDEPENDENT_AMBULATORY_CARE_PROVIDER_SITE_OTHER): Payer: PPO | Admitting: Otolaryngology

## 2023-07-30 VITALS — Ht 66.0 in | Wt 212.0 lb

## 2023-07-30 DIAGNOSIS — R040 Epistaxis: Secondary | ICD-10-CM

## 2023-07-31 NOTE — Progress Notes (Signed)
Patient ID: Nancy [name], female   DOB: 01/04/1966, 57 y.o.   MRN: [id number]  Follow-up: Recurrent left epistaxis  HPI: The patient is a 57 year old female who returns today for her follow-up evaluation.  She was last seen in November 2024.  At that time, she was complaining of recurrent left epistaxis.  Several hypervascular areas were noted on her left anterior and superior nasal septum.  She was treated with endoscopic cauterization of her left nasal septum.  The patient returns today reporting resolution of her epistaxis.  She has not had any significant bleeding over the past month.  She is able to breathe through both nostrils.  She denies any recent nasal trauma.  Exam: General: Communicates without difficulty, well nourished, no acute distress. Head: Normocephalic, no evidence injury, no tenderness, facial buttresses intact without stepoff. Face/sinus: No tenderness to palpation and percussion. Facial movement is normal and symmetric. Eyes: PERRL, EOMI. No scleral icterus, conjunctivae clear. Neuro: CN II exam reveals vision grossly intact.  No nystagmus at any point of gaze. Ears: Auricles well formed without lesions.  Ear canals are intact without mass or lesion.  No erythema or edema is appreciated.  The TMs are intact without fluid. Nose: External evaluation reveals normal support and skin without lesions.  Dorsum is intact.  Anterior rhinoscopy reveals congested mucosa over anterior aspect of inferior turbinates and intact septum.  No purulence noted. Oral:  Oral cavity and oropharynx are intact, symmetric, without erythema or edema.  Mucosa is moist without lesions. Neck: Full range of motion without pain.  There is no significant lymphadenopathy.  No masses palpable.  Thyroid bed within normal limits to palpation.  Parotid glands and submandibular glands equal bilaterally without mass.  Trachea is midline. Neuro:  CN 2-12 grossly intact.   Assessment: 1.  The patient is doing well status  post cauterization of her left nasal septum.  No significant hypervascular area or bleeding is noted today.  Plan: 1.  The physical exam findings are reviewed with the patient. 2.  Nasal ointment/humidifier as needed during the winter months. 3.  The patient is encouraged to call with any questions or concerns.

## 2023-08-20 DIAGNOSIS — E119 Type 2 diabetes mellitus without complications: Secondary | ICD-10-CM | POA: Diagnosis not present

## 2023-08-23 DIAGNOSIS — E1129 Type 2 diabetes mellitus with other diabetic kidney complication: Secondary | ICD-10-CM | POA: Diagnosis not present

## 2023-09-04 DIAGNOSIS — I10 Essential (primary) hypertension: Secondary | ICD-10-CM | POA: Diagnosis not present

## 2023-09-04 DIAGNOSIS — E1122 Type 2 diabetes mellitus with diabetic chronic kidney disease: Secondary | ICD-10-CM | POA: Diagnosis not present

## 2023-11-26 ENCOUNTER — Encounter (HOSPITAL_COMMUNITY): Payer: Self-pay

## 2023-11-26 ENCOUNTER — Other Ambulatory Visit: Payer: Self-pay

## 2023-11-26 ENCOUNTER — Emergency Department (HOSPITAL_COMMUNITY)
Admission: EM | Admit: 2023-11-26 | Discharge: 2023-11-27 | Disposition: A | Discharge status: Home / Self Care | Attending: Emergency Medicine | Admitting: Emergency Medicine

## 2023-11-26 DIAGNOSIS — R531 Weakness: Secondary | ICD-10-CM | POA: Diagnosis not present

## 2023-11-26 DIAGNOSIS — Z7982 Long term (current) use of aspirin: Secondary | ICD-10-CM | POA: Diagnosis not present

## 2023-11-26 DIAGNOSIS — Z79899 Other long term (current) drug therapy: Secondary | ICD-10-CM | POA: Diagnosis not present

## 2023-11-26 DIAGNOSIS — E039 Hypothyroidism, unspecified: Secondary | ICD-10-CM | POA: Diagnosis not present

## 2023-11-26 DIAGNOSIS — S20469A Insect bite (nonvenomous) of unspecified back wall of thorax, initial encounter: Secondary | ICD-10-CM | POA: Insufficient documentation

## 2023-11-26 DIAGNOSIS — Z794 Long term (current) use of insulin: Secondary | ICD-10-CM | POA: Insufficient documentation

## 2023-11-26 DIAGNOSIS — E119 Type 2 diabetes mellitus without complications: Secondary | ICD-10-CM | POA: Diagnosis not present

## 2023-11-26 DIAGNOSIS — Z7984 Long term (current) use of oral hypoglycemic drugs: Secondary | ICD-10-CM | POA: Insufficient documentation

## 2023-11-26 DIAGNOSIS — S80861A Insect bite (nonvenomous), right lower leg, initial encounter: Secondary | ICD-10-CM | POA: Diagnosis not present

## 2023-11-26 DIAGNOSIS — S80862A Insect bite (nonvenomous), left lower leg, initial encounter: Secondary | ICD-10-CM | POA: Diagnosis not present

## 2023-11-26 DIAGNOSIS — W57XXXA Bitten or stung by nonvenomous insect and other nonvenomous arthropods, initial encounter: Secondary | ICD-10-CM | POA: Insufficient documentation

## 2023-11-26 LAB — COMPREHENSIVE METABOLIC PANEL WITH GFR
ALT: 39 U/L (ref 0–44)
AST: 37 U/L (ref 15–41)
Albumin: 4 g/dL (ref 3.5–5.0)
Alkaline Phosphatase: 62 U/L (ref 38–126)
Anion gap: 15 (ref 5–15)
BUN: 15 mg/dL (ref 6–20)
CO2: 19 mmol/L — ABNORMAL LOW (ref 22–32)
Calcium: 8.9 mg/dL (ref 8.9–10.3)
Chloride: 104 mmol/L (ref 98–111)
Creatinine, Ser: 0.59 mg/dL (ref 0.44–1.00)
GFR, Estimated: >60 mL/min (ref >60)
Glucose, Bld: 260 mg/dL — ABNORMAL HIGH (ref 70–99)
Potassium: 4.2 mmol/L (ref 3.5–5.1)
Sodium: 138 mmol/L (ref 135–145)
Total Bilirubin: 1.1 mg/dL (ref 0.0–1.2)
Total Protein: 7.2 g/dL (ref 6.5–8.1)

## 2023-11-26 LAB — CBC WITH DIFFERENTIAL/PLATELET
Abs Immature Granulocytes: 0.03 10*3/uL (ref 0.00–0.07)
Basophils Absolute: 0 10*3/uL (ref 0.0–0.1)
Basophils Relative: 0 %
Eosinophils Absolute: 0.2 10*3/uL (ref 0.0–0.5)
Eosinophils Relative: 2 %
HCT: 48.7 % — ABNORMAL HIGH (ref 36.0–46.0)
Hemoglobin: 15.8 g/dL — ABNORMAL HIGH (ref 12.0–15.0)
Immature Granulocytes: 0 %
Lymphocytes Relative: 5 %
Lymphs Abs: 0.4 10*3/uL — ABNORMAL LOW (ref 0.7–4.0)
MCH: 29.8 pg (ref 26.0–34.0)
MCHC: 32.4 g/dL (ref 30.0–36.0)
MCV: 91.7 fL (ref 80.0–100.0)
Monocytes Absolute: 0.4 10*3/uL (ref 0.1–1.0)
Monocytes Relative: 5 %
Neutro Abs: 7.9 10*3/uL — ABNORMAL HIGH (ref 1.7–7.7)
Neutrophils Relative %: 88 %
Platelets: 198 10*3/uL (ref 150–400)
RBC: 5.31 MIL/uL — ABNORMAL HIGH (ref 3.87–5.11)
RDW: 13.7 % (ref 11.5–15.5)
WBC: 9 10*3/uL (ref 4.0–10.5)
nRBC: 0 % (ref 0.0–0.2)

## 2023-11-26 NOTE — ED Triage Notes (Signed)
 Pt reports weakness and fatigue that started today, pt removed tick from back 3 days ago, and reports this area is painful, thinks sx maybe related to bite.

## 2023-11-27 MED ORDER — DOXYCYCLINE HYCLATE 100 MG PO CAPS: 100.0000 mg | ORAL_CAPSULE | Freq: Two times a day (BID) | ORAL | 0 refills | Status: AC

## 2023-11-27 MED ORDER — DOXYCYCLINE HYCLATE 100 MG PO TABS
100.0000 mg | ORAL_TABLET | Freq: Once | ORAL | Status: AC
  Administered 2023-11-27: 100 mg via ORAL
  Filled 2023-11-27: qty 1

## 2023-11-27 NOTE — ED Provider Notes (Signed)
 [place] EMERGENCY DEPARTMENT AT [facility] Provider Note   CSN: [id number] Arrival date & time: 11/26/23  2053     History  Chief Complaint  Patient presents with   Weakness    Tick bite 3 days ago    Nancy [name] is a 58 y.o. female.  Patient is a 58 year old female with past medical history of hypothyroidism, type 2 diabetes.  Patient presenting today for evaluation of a tick bite.  She reports her husband removing a tick from her upper back on Thursday.  Since then she has been experiencing headaches, weakness, and feeling generally unwell.  She reports low-grade fevers at home.       Home Medications Prior to Admission medications   Medication Sig Start Date End Date Taking? Authorizing Provider  acetaminophen (TYLENOL) 500 MG tablet Take 1,000 mg by mouth every 6 (six) hours as needed for headache or moderate pain.    [provider]  aspirin EC 81 MG tablet Take 1 tablet (81 mg total) by mouth every evening. 04/13/22   Pappayliou, Santina Evans A, DO  atorvastatin (LIPITOR) 10 MG tablet Take 10 mg by mouth at bedtime.    [provider]  Cholecalciferol 5000 UNITS capsule Take 5,000 Units by mouth every morning. Vitamin D    [provider]  cyclobenzaprine (FLEXERIL) 5 MG tablet Take 5 mg by mouth 2 (two) times daily.    [provider]  dapagliflozin propanediol (FARXIGA) 10 MG TABS tablet Take 10 mg by mouth daily.    [provider]  doxycycline (VIBRA-TABS) 100 MG tablet Take 100 mg by mouth 2 (two) times daily as needed (rosacea).    [provider]  enalapril (VASOTEC) 5 MG tablet Take 5 mg by mouth at bedtime.      [provider]  fluticasone (FLONASE) 50 MCG/ACT nasal spray Place 2 sprays into both nostrils daily. 04/25/23   Leath-Warren, Sadie Haber, NP  insulin NPH Human (NOVOLIN N) 100 UNIT/ML injection Inject 55-60 Units into the skin See admin instructions. Inject 60 units in the morning and  55 units at night    [provider]  levothyroxine (SYNTHROID) 137 MCG tablet Take 137 mcg by mouth daily. 02/23/22   [provider]  loperamide (IMODIUM A-D) 2 MG tablet Take 2 mg by mouth 4 (four) times daily as needed for diarrhea or loose stools.    [provider]  metFORMIN (GLUCOPHAGE) 1000 MG tablet Take 1 tablet (1,000 mg total) by mouth 2 (two) times daily. 12/11/12   Riebock, Anette Riedel, NP  metroNIDAZOLE (METROCREAM) 0.75 % cream Apply 1 Application topically 2 (two) times daily as needed (rosacea).    [provider]  Multiple Vitamin (MULTIVITAMIN) tablet Take 1 tablet by mouth daily. Centrum Silver Adult 50+    [provider]  Probiotic Product (PROBIOTIC COLON SUPPORT) CAPS Take 1 capsule by mouth daily.    [provider]  Semaglutide, 1 MG/DOSE, (OZEMPIC, 1 MG/DOSE,) 2 MG/1.5ML SOPN Inject 1 mg into the skin every Monday.    [provider]      Allergies    Chocolate and Gabapentin    Review of Systems   Review of Systems  All other systems reviewed and are negative.   Physical Exam Updated Vital Signs BP (!) 112/45   Pulse 95   Temp 98.9 F (37.2 C) (Oral)   Resp 20   Ht 5\' 6"  (1.676 m)   Wt 96.2 kg   SpO2  92%   BMI 34.23 kg/m  Physical Exam Vitals and nursing note reviewed.  Constitutional:      General: She is not in acute distress.    Appearance: She is well-developed. She is not diaphoretic.  HENT:     Head: Normocephalic and atraumatic.  Cardiovascular:     Rate and Rhythm: Normal rate and regular rhythm.     Heart sounds: No murmur heard.    No friction rub. No gallop.  Pulmonary:     Effort: Pulmonary effort is normal. No respiratory distress.     Breath sounds: Normal breath sounds. No wheezing.  Abdominal:     General: Bowel sounds are normal. There is no distension.     Palpations: Abdomen is soft.     Tenderness: There is no abdominal tenderness.  Musculoskeletal:        General:  Normal range of motion.     Cervical back: Normal range of motion and neck supple.  Skin:    General: Skin is warm and dry.     Comments: There is a 2 cm, round, area of erythema with a puncture wound noted centrally.  There is no fluctuance or purulent drainage.  Neurological:     General: No focal deficit present.     Mental Status: She is alert and oriented to person, place, and time.     ED Results / Procedures / Treatments   Labs (all labs ordered are listed, but only abnormal results are displayed) Labs Reviewed  CBC WITH DIFFERENTIAL/PLATELET - Abnormal; Notable for the following components:      Result Value   RBC 5.31 (*)    Hemoglobin 15.8 (*)    HCT 48.7 (*)    Neutro Abs 7.9 (*)    Lymphs Abs 0.4 (*)    All other components within normal limits  COMPREHENSIVE METABOLIC PANEL WITH GFR - Abnormal; Notable for the following components:   CO2 19 (*)    Glucose, Bld 260 (*)    All other components within normal limits    EKG None  Radiology No results found.  Procedures Procedures    Medications Ordered in ED Medications - No data to display  ED Course/ Medical Decision Making/ A&P  Patient presenting with skin rash, weakness, and malaise in the setting of a recent tick bite.  Patient arrives here afebrile with stable vital signs.  Physical examination is unremarkable with the exception of the bite site with surrounding erythema.  CBC and basic metabolic panel basically unremarkable.  Patient to be treated with doxycycline and follow-up as needed.  Final Clinical Impression(s) / ED Diagnoses Final diagnoses:  None    Rx / DC Orders ED Discharge Orders     None         Geoffery Lyons, MD 11/27/23 [phone]

## 2023-11-27 NOTE — Discharge Instructions (Signed)
 Begin taking doxycycline as prescribed.  Follow-up with primary doctor if symptoms or not improving in the next few days.

## 2023-12-03 DIAGNOSIS — S20469A Insect bite (nonvenomous) of unspecified back wall of thorax, initial encounter: Secondary | ICD-10-CM | POA: Diagnosis not present

## 2023-12-05 DIAGNOSIS — E1129 Type 2 diabetes mellitus with other diabetic kidney complication: Secondary | ICD-10-CM | POA: Diagnosis not present

## 2023-12-12 DIAGNOSIS — R110 Nausea: Secondary | ICD-10-CM | POA: Diagnosis not present

## 2023-12-12 DIAGNOSIS — E1129 Type 2 diabetes mellitus with other diabetic kidney complication: Secondary | ICD-10-CM | POA: Diagnosis not present

## 2023-12-17 ENCOUNTER — Encounter: Payer: Self-pay | Admitting: Obstetrics & Gynecology

## 2023-12-17 ENCOUNTER — Ambulatory Visit (INDEPENDENT_AMBULATORY_CARE_PROVIDER_SITE_OTHER): Payer: PPO | Admitting: Obstetrics & Gynecology

## 2023-12-17 VITALS — BP 146/66 | HR 78 | Ht 66.0 in | Wt 222.0 lb

## 2023-12-17 DIAGNOSIS — Z1212 Encounter for screening for malignant neoplasm of rectum: Secondary | ICD-10-CM | POA: Diagnosis not present

## 2023-12-17 DIAGNOSIS — Z1211 Encounter for screening for malignant neoplasm of colon: Secondary | ICD-10-CM

## 2023-12-17 DIAGNOSIS — Z01419 Encounter for gynecological examination (general) (routine) without abnormal findings: Secondary | ICD-10-CM

## 2023-12-17 LAB — HEMOCCULT GUIAC POC 1CARD (OFFICE): Fecal Occult Blood, POC: NEGATIVE

## 2023-12-17 NOTE — Progress Notes (Signed)
 Subjective:     Nancy [name] is a 58 y.o. female here for a routine exam.  No LMP recorded. Patient has had an ablation. G0P0000 Birth Control Method:  menopausal Menstrual Calendar(currently): amenorrhea  Current complaints: none.   Current acute medical issues:  none   Recent Gynecologic History No LMP recorded. Patient has had an ablation. Last Pap: 12/2021,  normal Last mammogram: 02/02/23,  normal  Past Medical History:  Diagnosis Date   Arthritis    Back pain    Complication of anesthesia    Diabetes mellitus    Hypothyroidism    PONV (postoperative nausea and vomiting)    Rosacea    Small bowel obstruction (HCC)    Thyroid  disease    Ventral hernia     Past Surgical History:  Procedure Laterality Date   BACK SURGERY     x3; fusion with rods and screws   carpatunnel surgery Bilateral    CARPOMETACARPEL SUSPENSION PLASTY Left 05/23/2018   Procedure: CARPOMETACARPEL (CMC) SUSPENSION PLASTY LEFT THUMB;  Surgeon: Lyanne Sample, MD;  Location: [facility];  Service: Orthopedics;  Laterality: Left;   CARPOMETACARPEL SUSPENSION PLASTY Right 10/03/2018   Procedure: RIGHT THUMB SUSPENSONPLASTY WITH MICRO LINK SUTURE AND TRAPEXIUM EXCISION;  Surgeon: Lyanne Sample, MD;  Location: [facility];  Service: Orthopedics;  Laterality: Right;  AXILLARY BLOCK   CHOLECYSTECTOMY     COLON SURGERY     COLOSTOMY     ENDOMETRIAL ABLATION     INSERTION OF MESH  08/03/2012   Procedure: INSERTION OF MESH;  Surgeon: Diantha Fossa, MD;  Location: [facility] OR;  Service: General;  Laterality: N/A;   LAPAROSCOPIC BILATERAL SALPINGO OOPHERECTOMY Bilateral 05/12/2015   Procedure: LAPAROSCOPIC BILATERAL SALPINGO OOPHORECTOMY;  Surgeon: Wendelyn Halter, MD;  Location: [facility];  Service: Gynecology;  Laterality: Bilateral;   LAPAROSCOPIC LYSIS OF ADHESIONS Bilateral 05/12/2015   Procedure: EXTENSIVE LAPAROSCOPIC LYSIS OF ADHESIONS;  Surgeon: Wendelyn Halter, MD;  Location: [facility];  Service:  Gynecology;  Laterality: Bilateral;   LAPAROTOMY  08/03/2012   Procedure: EXPLORATORY LAPAROTOMY;  Surgeon: Diantha Fossa, MD;  Location[facility];  Service: General;  Laterality: N/A;  with extensive enterolysis   LIVER BIOPSY     MASS EXCISION Left 04/12/2022   Procedure: EXCISION MASS;  Surgeon: Marijo Shove, DO;  Location: [facility];  Service: General;  Laterality: Left;   REVISION COLOSTOMY     TENDON TRANSFER Left 05/23/2018   Procedure: TENDON TRANSFER WITH TRAPEZIUM EXCISION;  Surgeon: Lyanne Sample, MD;  Location: [facility];  Service: Orthopedics;  Laterality: Left;   TRIGGER FINGER RELEASE Left 05/23/2018   Procedure: RELEASE TRIGGER FINGER/A-1 PULLEY LEFT THUMB;  Surgeon: Lyanne Sample, MD;  Location: [facility];  Service: Orthopedics;  Laterality: Left;   TRIGGER FINGER RELEASE Right 09/23/2019   Procedure: RIGHT THUMB RELEASE TRIGGER FINGER/A-1 PULLEY;  Surgeon: Lyanne Sample, MD;  Location: [facility];  Service: Orthopedics;  Laterality: Right;  IV REGIONAL FOREARM BLOCK   VENTRAL HERNIA REPAIR  08/03/2012   Procedure: HERNIA REPAIR VENTRAL ADULT;  Surgeon: Diantha Fossa, MD;  Location: [facility];  Service: General;  Laterality: N/A;   VENTRAL HERNIA REPAIR  03/13/2016   Procedure: INCISIONAL HERNIA REPAIR WITH MESH;  Surgeon: Alanda Allegra, MD;  Location: [facility];  Service: General;;    OB History     Gravida  0   Para  0   Term  0  Preterm  0   AB  0   Living  0      SAB  0   IAB  0   Ectopic  0   Multiple  0   Live Births  0           Social History   Socioeconomic History   Marital status: Married    Spouse name: Not on file   Number of children: Not on file   Years of education: Not on file   Highest education level: Not on file  Occupational History   Not on file  Tobacco Use   Smoking status: Never   Smokeless tobacco: Never  Vaping Use   Vaping status: Never Used  Substance and Sexual Activity    Alcohol use: No    Alcohol/week: 0.0 standard drinks of alcohol   Drug use: No   Sexual activity: Yes    Birth control/protection: Surgical, Post-menopausal    Comment: ablation  Other Topics Concern   Not on file  Social History Narrative   Not on file   Social Drivers of Health   Financial Resource Strain: Low Risk  (12/17/2023)   Overall Financial Resource Strain (CARDIA)    Difficulty of Paying Living Expenses: Not very hard  Food Insecurity: No Food Insecurity (12/17/2023)   Hunger Vital Sign    Worried About Running Out of Food in the Last Year: Never true    Ran Out of Food in the Last Year: Never true  Transportation Needs: No Transportation Needs (12/17/2023)   PRAPARE - Administrator, Civil Service (Medical): No    Lack of Transportation (Non-Medical): No  Physical Activity: Insufficiently Active (12/17/2023)   Exercise Vital Sign    Days of Exercise per Week: 5 days    Minutes of Exercise per Session: 20 min  Stress: No Stress Concern Present (12/17/2023)   [employer] of Occupational Health - Occupational Stress Questionnaire    Feeling of Stress : Not at all  Social Connections: Moderately Integrated (12/17/2023)   Social Connection and Isolation Panel [NHANES]    Frequency of Communication with Friends and Family: More than three times a week    Frequency of Social Gatherings with Friends and Family: More than three times a week    Attends Religious Services: More than 4 times per year    Active Member of [employer] or Organizations: No    Attends Engineer, structural: Never    Marital Status: Married    Family History  Problem Relation Age of Onset   Thyroid  disease Mother    Heart disease Father    Diabetes Mellitus I Maternal Grandmother    Diabetes Mellitus I Maternal Grandfather    Diabetes Mellitus I Paternal Grandmother    Diabetes Mellitus I Paternal Grandfather      Current Outpatient Medications:    acetaminophen  (TYLENOL )  500 MG tablet, Take 1,000 mg by mouth every 6 (six) hours as needed for headache or moderate pain., Disp: , Rfl:    aspirin  EC 81 MG tablet, Take 1 tablet (81 mg total) by mouth every evening., Disp: 30 tablet, Rfl: 12   atorvastatin  (LIPITOR) 10 MG tablet, Take 10 mg by mouth at bedtime., Disp: , Rfl:    Cholecalciferol 5000 UNITS capsule, Take 5,000 Units by mouth every morning. Vitamin D, Disp: , Rfl:    dapagliflozin propanediol (FARXIGA) 10 MG TABS tablet, Take 10 mg by mouth daily., Disp: , Rfl:  doxycycline  (VIBRAMYCIN ) 100 MG capsule, Take 1 capsule (100 mg total) by mouth 2 (two) times daily., Disp: 20 capsule, Rfl: 0   enalapril  (VASOTEC ) 5 MG tablet, Take 5 mg by mouth at bedtime.  , Disp: , Rfl:    fluticasone  (FLONASE ) 50 MCG/ACT nasal spray, Place 2 sprays into both nostrils daily., Disp: 16 g, Rfl: 0   insulin  NPH Human (NOVOLIN N) 100 UNIT/ML injection, Inject 55-60 Units into the skin See admin instructions. Inject 60 units in the morning and 55 units at night, Disp: , Rfl:    levothyroxine  (SYNTHROID ) 137 MCG tablet, Take 137 mcg by mouth daily., Disp: , Rfl:    loperamide (IMODIUM A-D) 2 MG tablet, Take 2 mg by mouth 4 (four) times daily as needed for diarrhea or loose stools., Disp: , Rfl:    metFORMIN  (GLUCOPHAGE ) 1000 MG tablet, Take 1 tablet (1,000 mg total) by mouth 2 (two) times daily., Disp: , Rfl:    methocarbamol  (ROBAXIN ) 500 MG tablet, Take 500 mg by mouth daily as needed., Disp: , Rfl:    metroNIDAZOLE  (METROCREAM ) 0.75 % cream, Apply 1 Application topically 2 (two) times daily as needed (rosacea)., Disp: , Rfl:    Multiple Vitamin (MULTIVITAMIN) tablet, Take 1 tablet by mouth daily. Centrum Silver Adult 50+, Disp: , Rfl:    Probiotic Product (PROBIOTIC COLON SUPPORT) CAPS, Take 1 capsule by mouth daily., Disp: , Rfl:    Semaglutide, 1 MG/DOSE, (OZEMPIC, 1 MG/DOSE,) 2 MG/1.5ML SOPN, Inject 1 mg into the skin every Monday., Disp: , Rfl:   Review of Systems  Review  of Systems  Constitutional: Negative for fever, chills, weight loss, malaise/fatigue and diaphoresis.  HENT: Negative for hearing loss, ear pain, nosebleeds, congestion, sore throat, neck pain, tinnitus and ear discharge.   Eyes: Negative for blurred vision, double vision, photophobia, pain, discharge and redness.  Respiratory: Negative for cough, hemoptysis, sputum production, shortness of breath, wheezing and stridor.   Cardiovascular: Negative for chest pain, palpitations, orthopnea, claudication, leg swelling and PND.  Gastrointestinal: negative for abdominal pain. Negative for heartburn, nausea, vomiting, diarrhea, constipation, blood in stool and melena.  Genitourinary: Negative for dysuria, urgency, frequency, hematuria and flank pain.  Musculoskeletal: Negative for myalgias, back pain, joint pain and falls.  Skin: Negative for itching and rash.  Neurological: Negative for dizziness, tingling, tremors, sensory change, speech change, focal weakness, seizures, loss of consciousness, weakness and headaches.  Endo/Heme/Allergies: Negative for environmental allergies and polydipsia. Does not bruise/bleed easily.  Psychiatric/Behavioral: Negative for depression, suicidal ideas, hallucinations, memory loss and substance abuse. The patient is not nervous/anxious and does not have insomnia.        Objective:  Blood pressure (!) 146/66, pulse 78, height 5\' 6"  (1.676 m), weight 222 lb (100.7 kg).   Physical Exam  Vitals reviewed. Constitutional: She is oriented to person, place, and time. She appears well-developed and well-nourished.  HENT:  Head: Normocephalic and atraumatic.        Right Ear: External ear normal.  Left Ear: External ear normal.  Nose: Nose normal.  Mouth/Throat: Oropharynx is clear and moist.  Eyes: Conjunctivae and EOM are normal. Pupils are equal, round, and reactive to light. Right eye exhibits no discharge. Left eye exhibits no discharge. No scleral icterus.  Neck:  Normal range of motion. Neck supple. No tracheal deviation present. No thyromegaly present.  Cardiovascular: Normal rate, regular rhythm, normal heart sounds and intact distal pulses.  Exam reveals no gallop and no friction rub.   No murmur  heard. Respiratory: Effort normal and breath sounds normal. No respiratory distress. She has no wheezes. She has no rales. She exhibits no tenderness.  GI: Soft. Bowel sounds are normal. She exhibits no distension and no mass. There is no tenderness. There is no rebound and no guarding.  Genitourinary:  Breasts no masses skin changes or nipple changes bilaterally      Vulva is normal without lesions Vagina is pink moist without discharge Cervix normal in appearance and pap is done Uterus is normal size shape and contour Adnexa is negative with normal sized ovaries  {Rectal    hemoccult negative, normal tone, no masses  Musculoskeletal: Normal range of motion. She exhibits no edema and no tenderness.  Neurological: She is alert and oriented to person, place, and time. She has normal reflexes. She displays normal reflexes. No cranial nerve deficit. She exhibits normal muscle tone. Coordination normal.  Skin: Skin is warm and dry. No rash noted. No erythema. No pallor.  Psychiatric: She has a normal mood and affect. Her behavior is normal. Judgment and thought content normal.       Medications Ordered at today's visit: No orders of the defined types were placed in this encounter.   Other orders placed at today's visit: Orders Placed This Encounter  Procedures   POCT occult blood stool     ASSESSMENT + PLAN:    ICD-10-CM   1. Well woman exam with routine gynecological exam  Z01.419     2. Screening for colorectal cancer  Z12.11 POCT occult blood stool   Z12.12           Return in about 2 years (around 12/16/2025) for yearly.

## 2023-12-20 ENCOUNTER — Other Ambulatory Visit (HOSPITAL_COMMUNITY): Payer: Self-pay | Admitting: Obstetrics & Gynecology

## 2023-12-20 DIAGNOSIS — Z1231 Encounter for screening mammogram for malignant neoplasm of breast: Secondary | ICD-10-CM

## 2024-02-04 ENCOUNTER — Ambulatory Visit (HOSPITAL_COMMUNITY)
Admission: RE | Admit: 2024-02-04 | Discharge: 2024-02-04 | Disposition: A | Discharge status: Home / Self Care | Source: Ambulatory Visit | Attending: Obstetrics & Gynecology | Admitting: Obstetrics & Gynecology

## 2024-02-04 DIAGNOSIS — Z1231 Encounter for screening mammogram for malignant neoplasm of breast: Secondary | ICD-10-CM | POA: Diagnosis not present

## 2024-02-26 ENCOUNTER — Other Ambulatory Visit: Payer: Self-pay | Admitting: Internal Medicine

## 2024-02-26 DIAGNOSIS — K869 Disease of pancreas, unspecified: Secondary | ICD-10-CM

## 2024-03-12 DIAGNOSIS — E1129 Type 2 diabetes mellitus with other diabetic kidney complication: Secondary | ICD-10-CM | POA: Diagnosis not present

## 2024-03-12 DIAGNOSIS — Z79899 Other long term (current) drug therapy: Secondary | ICD-10-CM | POA: Diagnosis not present

## 2024-03-12 DIAGNOSIS — I10 Essential (primary) hypertension: Secondary | ICD-10-CM | POA: Diagnosis not present

## 2024-03-18 DIAGNOSIS — E1129 Type 2 diabetes mellitus with other diabetic kidney complication: Secondary | ICD-10-CM | POA: Diagnosis not present

## 2024-03-18 DIAGNOSIS — R800 Isolated proteinuria: Secondary | ICD-10-CM | POA: Diagnosis not present

## 2024-03-18 DIAGNOSIS — E785 Hyperlipidemia, unspecified: Secondary | ICD-10-CM | POA: Diagnosis not present

## 2024-03-18 DIAGNOSIS — K760 Fatty (change of) liver, not elsewhere classified: Secondary | ICD-10-CM | POA: Diagnosis not present

## 2024-04-01 ENCOUNTER — Ambulatory Visit
Admission: RE | Admit: 2024-04-01 | Discharge: 2024-04-01 | Disposition: A | Discharge status: Home / Self Care | Source: Ambulatory Visit | Attending: Internal Medicine | Admitting: Internal Medicine

## 2024-04-01 DIAGNOSIS — K862 Cyst of pancreas: Secondary | ICD-10-CM | POA: Diagnosis not present

## 2024-04-01 DIAGNOSIS — K869 Disease of pancreas, unspecified: Secondary | ICD-10-CM

## 2024-04-01 MED ORDER — GADOPICLENOL 0.5 MMOL/ML IV SOLN
10.0000 mL | Freq: Once | INTRAVENOUS | Status: AC | PRN
Start: 2024-04-01 — End: 2024-04-01
  Administered 2024-04-01 (×2): 10 mL via INTRAVENOUS

## 2024-05-27 DIAGNOSIS — Z23 Encounter for immunization: Secondary | ICD-10-CM | POA: Diagnosis not present

## 2024-06-11 DIAGNOSIS — I10 Essential (primary) hypertension: Secondary | ICD-10-CM | POA: Diagnosis not present

## 2024-06-11 DIAGNOSIS — E039 Hypothyroidism, unspecified: Secondary | ICD-10-CM | POA: Diagnosis not present

## 2024-06-11 DIAGNOSIS — E1129 Type 2 diabetes mellitus with other diabetic kidney complication: Secondary | ICD-10-CM | POA: Diagnosis not present

## 2024-06-11 DIAGNOSIS — Z79899 Other long term (current) drug therapy: Secondary | ICD-10-CM | POA: Diagnosis not present

## 2024-06-18 DIAGNOSIS — E039 Hypothyroidism, unspecified: Secondary | ICD-10-CM | POA: Diagnosis not present

## 2024-06-18 DIAGNOSIS — E1129 Type 2 diabetes mellitus with other diabetic kidney complication: Secondary | ICD-10-CM | POA: Diagnosis not present

## 2024-06-18 DIAGNOSIS — I10 Essential (primary) hypertension: Secondary | ICD-10-CM | POA: Diagnosis not present
